# Patient Record
Sex: Female | Born: 1940 | Race: White | Hispanic: No | Smoking: Former smoker
Health system: Southern US, Community
[De-identification: ages and names within clinical notes are randomized; demographics above are authoritative.]

## PROBLEM LIST (undated history)

## (undated) DIAGNOSIS — R011 Cardiac murmur, unspecified: Secondary | ICD-10-CM

## (undated) DIAGNOSIS — I480 Paroxysmal atrial fibrillation: Secondary | ICD-10-CM

## (undated) DIAGNOSIS — I2510 Atherosclerotic heart disease of native coronary artery without angina pectoris: Secondary | ICD-10-CM

## (undated) DIAGNOSIS — E119 Type 2 diabetes mellitus without complications: Secondary | ICD-10-CM

## (undated) DIAGNOSIS — I10 Essential (primary) hypertension: Secondary | ICD-10-CM

## (undated) DIAGNOSIS — B019 Varicella without complication: Secondary | ICD-10-CM

## (undated) DIAGNOSIS — E785 Hyperlipidemia, unspecified: Secondary | ICD-10-CM

## (undated) DIAGNOSIS — I5189 Other ill-defined heart diseases: Secondary | ICD-10-CM

## (undated) HISTORY — DX: Hyperlipidemia, unspecified: E78.5

## (undated) HISTORY — DX: Other ill-defined heart diseases: I51.89

## (undated) HISTORY — DX: Cardiac murmur, unspecified: R01.1

## (undated) HISTORY — PX: CARDIAC CATHETERIZATION: SHX172

## (undated) HISTORY — DX: Varicella without complication: B01.9

## (undated) HISTORY — DX: Atherosclerotic heart disease of native coronary artery without angina pectoris: I25.10

## (undated) HISTORY — DX: Essential (primary) hypertension: I10

---
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
## 2002-05-30 HISTORY — PX: OTHER SURGICAL HISTORY: SHX169

## 2010-05-30 LAB — HM MAMMOGRAPHY: HM Mammogram: NORMAL

## 2010-12-12 LAB — HM MAMMOGRAPHY: HM Mammogram: NORMAL

## 2011-07-29 LAB — HM DIABETES EYE EXAM

## 2011-12-12 ENCOUNTER — Encounter: Payer: Self-pay | Admitting: Internal Medicine

## 2011-12-12 ENCOUNTER — Ambulatory Visit (INDEPENDENT_AMBULATORY_CARE_PROVIDER_SITE_OTHER): Payer: Medicare PPO | Admitting: Internal Medicine

## 2011-12-12 VITALS — BP 112/82 | HR 73 | Temp 98.7°F | Ht 64.0 in | Wt 230.5 lb

## 2011-12-12 DIAGNOSIS — E119 Type 2 diabetes mellitus without complications: Secondary | ICD-10-CM

## 2011-12-12 DIAGNOSIS — I10 Essential (primary) hypertension: Secondary | ICD-10-CM

## 2011-12-12 DIAGNOSIS — E785 Hyperlipidemia, unspecified: Secondary | ICD-10-CM | POA: Insufficient documentation

## 2011-12-12 DIAGNOSIS — E669 Obesity, unspecified: Secondary | ICD-10-CM

## 2011-12-12 DIAGNOSIS — H409 Unspecified glaucoma: Secondary | ICD-10-CM | POA: Insufficient documentation

## 2011-12-12 MED ORDER — GLUCOSE BLOOD VI STRP: ORAL_STRIP | Status: DC

## 2011-12-12 MED ORDER — DESONIDE 0.05 % EX CREA: 1.0000 "application " | TOPICAL_CREAM | CUTANEOUS | Status: DC | PRN

## 2011-12-12 MED ORDER — ATORVASTATIN CALCIUM 20 MG PO TABS: 20.0000 mg | ORAL_TABLET | Freq: Every day | ORAL | Status: DC

## 2011-12-12 MED ORDER — TRAVOPROST 0.004 % OP SOLN: 1.0000 [drp] | Freq: Every day | OPHTHALMIC | Status: DC

## 2011-12-12 MED ORDER — METFORMIN HCL ER 500 MG PO TB24: 500.0000 mg | ORAL_TABLET | Freq: Two times a day (BID) | ORAL | Status: DC

## 2011-12-12 MED ORDER — METOPROLOL SUCCINATE ER 50 MG PO TB24: 50.0000 mg | ORAL_TABLET | Freq: Every day | ORAL | Status: DC

## 2011-12-12 MED ORDER — HYDROCHLOROTHIAZIDE 25 MG PO TABS: 25.0000 mg | ORAL_TABLET | Freq: Every day | ORAL | Status: DC

## 2011-12-12 NOTE — Assessment & Plan Note (Signed)
Patient reports good control of blood sugars. Will plan to recheck A1c with labs in October. Will request records on previous evaluation and management.

## 2011-12-12 NOTE — Assessment & Plan Note (Signed)
Will request records on previous evaluation and management. 

## 2011-12-12 NOTE — Assessment & Plan Note (Signed)
Will request labs on previous evaluation and management. Will continue Lipitor. Will check lipids with labs in October 2013.

## 2011-12-12 NOTE — Assessment & Plan Note (Signed)
BMI 39. Encourage continued efforts at healthy diet and regular physical activity.

## 2011-12-12 NOTE — Progress Notes (Signed)
Subjective:    Patient ID: Gabriela Reed, female    DOB: [birth year], 71 y.o.   MRN: [id number]  HPI 71 year old female with history of diabetes, hypertension, hyperlipidemia, glaucoma presents to establish care. She reports she is generally feeling well. She recently moved to [facility] from [place]. She moved in order to be closer to her daughter. She reports that her diabetes has been well-controlled. She reports full compliance with her medications. She is unsure of her last hemoglobin A1c. She notes that her previous physician was checking her A1c every 6 months. She reports that she has had some difficulty with diet, but follows a healthy exercise routine participating in water aerobics classes and other classes at her local [employer]. She also reports full compliance with her other medications. She has not yet established care with an ophthalmologist locally.  Outpatient Encounter Prescriptions as of 12/12/2011  Medication Sig Dispense Refill  . aspirin 81 MG tablet Take 81 mg by mouth daily.      Marland Kitchen atorvastatin (LIPITOR) 20 MG tablet Take 1 tablet (20 mg total) by mouth daily.  90 tablet  3  . desonide (DESOWEN) 0.05 % cream Apply 1 application topically as needed.  60 g  3  . glucose blood test strip One touch ultra test strips-test blood sugar every other day  100 each  3  . hydrochlorothiazide (HYDRODIURIL) 25 MG tablet Take 1 tablet (25 mg total) by mouth daily.  90 tablet  3  . metFORMIN (GLUCOPHAGE-XR) 500 MG 24 hr tablet Take 1 tablet (500 mg total) by mouth 2 (two) times daily.  180 tablet  3  . metoprolol succinate (TOPROL-XL) 50 MG 24 hr tablet Take 1 tablet (50 mg total) by mouth daily. Take with or immediately following a meal.  90 tablet  3  . travoprost, benzalkonium, (TRAVATAN) 0.004 % ophthalmic solution Place 1 drop into the left eye at bedtime.  5 mL  3   BP 112/82  Pulse 73  Temp 98.7 F (37.1 C) (Oral)  Ht 5\' 4"  (1.626 m)  Wt 230 lb 8 oz (104.554 kg)   BMI 39.57 kg/m2  SpO2 97% Review of Systems  Constitutional: Negative for fever, chills, appetite change, fatigue and unexpected weight change.  HENT: Negative for ear pain, congestion, sore throat, trouble swallowing, neck pain, voice change and sinus pressure.   Eyes: Negative for visual disturbance.  Respiratory: Negative for cough, shortness of breath, wheezing and stridor.   Cardiovascular: Negative for chest pain, palpitations and leg swelling.  Gastrointestinal: Negative for nausea, vomiting, abdominal pain, diarrhea, constipation, blood in stool, abdominal distention and anal bleeding.  Genitourinary: Negative for dysuria and flank pain.  Musculoskeletal: Negative for myalgias, arthralgias and gait problem.  Skin: Negative for color change and rash.  Neurological: Negative for dizziness and headaches.  Hematological: Negative for adenopathy. Does not bruise/bleed easily.  Psychiatric/Behavioral: Negative for suicidal ideas, disturbed wake/sleep cycle and dysphoric mood. The patient is not nervous/anxious.        Objective:   Physical Exam  Constitutional: She is oriented to person, place, and time. She appears well-developed and well-nourished. No distress.  HENT:  Head: Normocephalic and atraumatic.  Right Ear: External ear normal.  Left Ear: External ear normal.  Nose: Nose normal.  Mouth/Throat: Oropharynx is clear and moist. No oropharyngeal exudate.  Eyes: Conjunctivae are normal. Pupils are equal, round, and reactive to light. Right eye exhibits no discharge. Left eye exhibits no discharge. No scleral icterus.  Neck: Normal range  of motion. Neck supple. No tracheal deviation present. No thyromegaly present.  Cardiovascular: Normal rate, regular rhythm, normal heart sounds and intact distal pulses.  Exam reveals no gallop and no friction rub.   No murmur heard. Pulmonary/Chest: Effort normal and breath sounds normal. No respiratory distress. She has no wheezes. She has no  rales. She exhibits no tenderness.  Abdominal: Soft. Bowel sounds are normal. She exhibits no distension and no mass. There is no tenderness. There is no guarding.  Musculoskeletal: Normal range of motion. She exhibits no edema and no tenderness.  Lymphadenopathy:    She has no cervical adenopathy.  Neurological: She is alert and oriented to person, place, and time. No cranial nerve deficit. She exhibits normal muscle tone. Coordination normal.  Skin: Skin is warm and dry. No rash noted. She is not diaphoretic. No erythema. No pallor.  Psychiatric: She has a normal mood and affect. Her behavior is normal. Judgment and thought content normal.          Assessment & Plan:

## 2011-12-12 NOTE — Assessment & Plan Note (Signed)
Blood pressure well-controlled today. Will request records on previous dilation and management. Will check renal function with labs in October 2013.

## 2011-12-13 ENCOUNTER — Encounter: Payer: Self-pay | Admitting: Internal Medicine

## 2012-03-07 ENCOUNTER — Other Ambulatory Visit (INDEPENDENT_AMBULATORY_CARE_PROVIDER_SITE_OTHER): Payer: Medicare PPO

## 2012-03-07 DIAGNOSIS — E119 Type 2 diabetes mellitus without complications: Secondary | ICD-10-CM

## 2012-03-07 DIAGNOSIS — E785 Hyperlipidemia, unspecified: Secondary | ICD-10-CM

## 2012-03-07 LAB — LIPID PANEL
HDL: 43.5 mg/dL (ref >39.00)
LDL Cholesterol: 64 mg/dL (ref 0–99)
Total CHOL/HDL Ratio: 3
VLDL: 24.2 mg/dL (ref 0.0–40.0)

## 2012-03-07 LAB — COMPREHENSIVE METABOLIC PANEL
ALT: 30 U/L (ref 0–35)
AST: 27 U/L (ref 0–37)
Albumin: 4.1 g/dL (ref 3.5–5.2)
Alkaline Phosphatase: 59 U/L (ref 39–117)
Glucose, Bld: 128 mg/dL — ABNORMAL HIGH (ref 70–99)
Potassium: 4.3 mEq/L (ref 3.5–5.1)
Sodium: 142 mEq/L (ref 135–145)
Total Bilirubin: 0.8 mg/dL (ref 0.3–1.2)
Total Protein: 7.4 g/dL (ref 6.0–8.3)

## 2012-03-08 ENCOUNTER — Other Ambulatory Visit: Payer: Medicare PPO

## 2012-03-13 ENCOUNTER — Ambulatory Visit (INDEPENDENT_AMBULATORY_CARE_PROVIDER_SITE_OTHER): Payer: Medicare PPO | Admitting: Internal Medicine

## 2012-03-13 ENCOUNTER — Encounter: Payer: Self-pay | Admitting: Internal Medicine

## 2012-03-13 VITALS — BP 140/90 | HR 83 | Temp 98.7°F | Ht 64.0 in | Wt 234.5 lb

## 2012-03-13 DIAGNOSIS — E785 Hyperlipidemia, unspecified: Secondary | ICD-10-CM

## 2012-03-13 DIAGNOSIS — E119 Type 2 diabetes mellitus without complications: Secondary | ICD-10-CM

## 2012-03-13 DIAGNOSIS — I10 Essential (primary) hypertension: Secondary | ICD-10-CM

## 2012-03-13 DIAGNOSIS — E669 Obesity, unspecified: Secondary | ICD-10-CM

## 2012-03-13 DIAGNOSIS — I2510 Atherosclerotic heart disease of native coronary artery without angina pectoris: Secondary | ICD-10-CM | POA: Insufficient documentation

## 2012-03-13 DIAGNOSIS — R42 Dizziness and giddiness: Secondary | ICD-10-CM

## 2012-03-13 LAB — HM MAMMOGRAPHY

## 2012-03-13 LAB — HM COLONOSCOPY

## 2012-03-13 NOTE — Assessment & Plan Note (Signed)
BP slightly elevated today, but has been well controlled at home. Will continue current medications. Recent renal function normal. Follow up 6 months and prn.

## 2012-03-13 NOTE — Assessment & Plan Note (Signed)
BG well controlled with A1c of 6.6%. Will continue metformin. Follow up in 6 months per pt preference.

## 2012-03-13 NOTE — Assessment & Plan Note (Signed)
Encouraged pt to continue efforts at healthy diet and regular physical activity. Follow up 6 months and prn.

## 2012-03-13 NOTE — Progress Notes (Signed)
Subjective:    Patient ID: Gabriela Reed, female    DOB: 1941/04/21, 71 y.o.   MRN: [id number]  HPI 71 year old female with history of diabetes, obesity, hyperlipidemia, hypertension presents for followup. She reports she is generally doing well. She continues to follow relatively healthy diet and has been trying to limit intake of processed carbohydrates. She has also been exercising on a regular basis with water aerobics and walking. She reports full compliance of medications. She reports that fasting blood sugars have been near 100. She did have one episode when she was driving and for a couple of seconds she became lightheaded. This resolved without any intervention. She did not have any chest pain, shortness of breath, diaphoresis during that time. She was not able to check her blood sugar during that time. She has not had any recurrent episodes. She has no new concerns today.  Outpatient Encounter Prescriptions as of 03/13/2012  Medication Sig Dispense Refill  . aspirin 81 MG tablet Take 81 mg by mouth daily.      Marland Kitchen atorvastatin (LIPITOR) 20 MG tablet Take 1 tablet (20 mg total) by mouth daily.  90 tablet  3  . desonide (DESOWEN) 0.05 % cream Apply 1 application topically as needed.  60 g  3  . glucose blood test strip One touch ultra test strips-test blood sugar every other day  100 each  3  . hydrochlorothiazide (HYDRODIURIL) 25 MG tablet Take 1 tablet (25 mg total) by mouth daily.  90 tablet  3  . metFORMIN (GLUCOPHAGE-XR) 500 MG 24 hr tablet Take 1 tablet (500 mg total) by mouth 2 (two) times daily.  180 tablet  3  . metoprolol succinate (TOPROL-XL) 50 MG 24 hr tablet Take 1 tablet (50 mg total) by mouth daily. Take with or immediately following a meal.  90 tablet  3  . travoprost, benzalkonium, (TRAVATAN) 0.004 % ophthalmic solution Place 1 drop into the left eye at bedtime.  5 mL  3   BP 140/90  Pulse 83  Temp 98.7 F (37.1 C) (Oral)  Ht 5\' 4"  (1.626 m)  Wt 234 lb 8 oz (106.369  kg)  BMI 40.25 kg/m2  SpO2 98%  Review of Systems  Constitutional: Negative for fever, chills, appetite change, fatigue and unexpected weight change.  HENT: Negative for ear pain, congestion, sore throat, trouble swallowing, neck pain, voice change and sinus pressure.   Eyes: Negative for visual disturbance.  Respiratory: Negative for cough, shortness of breath, wheezing and stridor.   Cardiovascular: Negative for chest pain, palpitations and leg swelling.  Gastrointestinal: Negative for nausea, vomiting, abdominal pain, diarrhea, constipation, blood in stool, abdominal distention and anal bleeding.  Genitourinary: Negative for dysuria and flank pain.  Musculoskeletal: Negative for myalgias, arthralgias and gait problem.  Skin: Negative for color change and rash.  Neurological: Positive for light-headedness. Negative for dizziness and headaches.  Hematological: Negative for adenopathy. Does not bruise/bleed easily.  Psychiatric/Behavioral: Negative for suicidal ideas, disturbed wake/sleep cycle and dysphoric mood. The patient is not nervous/anxious.        Objective:   Physical Exam  Constitutional: She is oriented to person, place, and time. She appears well-developed and well-nourished. No distress.  HENT:  Head: Normocephalic and atraumatic.  Right Ear: External ear normal.  Left Ear: External ear normal.  Nose: Nose normal.  Mouth/Throat: Oropharynx is clear and moist. No oropharyngeal exudate.  Eyes: Conjunctivae normal are normal. Pupils are equal, round, and reactive to light. Right eye exhibits no discharge. Left  eye exhibits no discharge. No scleral icterus.  Neck: Normal range of motion. Neck supple. No tracheal deviation present. No thyromegaly present.  Cardiovascular: Normal rate, regular rhythm, normal heart sounds and intact distal pulses.  Exam reveals no gallop and no friction rub.   No murmur heard. Pulmonary/Chest: Effort normal and breath sounds normal. No  respiratory distress. She has no wheezes. She has no rales. She exhibits no tenderness.  Musculoskeletal: Normal range of motion. She exhibits no edema and no tenderness.  Lymphadenopathy:    She has no cervical adenopathy.  Neurological: She is alert and oriented to person, place, and time. No cranial nerve deficit. She exhibits normal muscle tone. Coordination normal.  Skin: Skin is warm and dry. No rash noted. She is not diaphoretic. No erythema. No pallor.  Psychiatric: She has a normal mood and affect. Her behavior is normal. Judgment and thought content normal.          Assessment & Plan:

## 2012-03-13 NOTE — Assessment & Plan Note (Signed)
Currently asymptomatic. LDL at goal <70. Will continue current medications. Follow up in 6 months and prn.

## 2012-03-13 NOTE — Assessment & Plan Note (Signed)
Recent Lipids well controlled with LDL 64 and LFTs normal. Will check lipids prior to next visit in 6 months.

## 2012-03-13 NOTE — Assessment & Plan Note (Addendum)
Patient had single episode of lightheadedness which lasted for a couple of seconds with no other associated symptoms. Unclear whether this may be have been related to dehydration, hypoglycemia, or other cause. Will monitor for now. Patient will call if any recurrent symptoms.

## 2012-07-11 ENCOUNTER — Encounter: Payer: Self-pay | Admitting: Internal Medicine

## 2012-07-11 ENCOUNTER — Ambulatory Visit (INDEPENDENT_AMBULATORY_CARE_PROVIDER_SITE_OTHER): Payer: Medicare PPO | Admitting: Internal Medicine

## 2012-07-11 ENCOUNTER — Ambulatory Visit: Payer: Self-pay | Admitting: Internal Medicine

## 2012-07-11 VITALS — BP 142/78 | HR 86 | Temp 98.2°F | Ht 64.0 in | Wt 237.8 lb

## 2012-07-11 DIAGNOSIS — E119 Type 2 diabetes mellitus without complications: Secondary | ICD-10-CM

## 2012-07-11 DIAGNOSIS — H409 Unspecified glaucoma: Secondary | ICD-10-CM

## 2012-07-11 DIAGNOSIS — R002 Palpitations: Secondary | ICD-10-CM

## 2012-07-11 DIAGNOSIS — I10 Essential (primary) hypertension: Secondary | ICD-10-CM

## 2012-07-11 DIAGNOSIS — E785 Hyperlipidemia, unspecified: Secondary | ICD-10-CM

## 2012-07-11 DIAGNOSIS — R200 Anesthesia of skin: Secondary | ICD-10-CM

## 2012-07-11 DIAGNOSIS — R51 Headache: Secondary | ICD-10-CM

## 2012-07-11 DIAGNOSIS — R519 Headache, unspecified: Secondary | ICD-10-CM | POA: Insufficient documentation

## 2012-07-11 DIAGNOSIS — R739 Hyperglycemia, unspecified: Secondary | ICD-10-CM

## 2012-07-11 DIAGNOSIS — R7309 Other abnormal glucose: Secondary | ICD-10-CM

## 2012-07-11 DIAGNOSIS — R209 Unspecified disturbances of skin sensation: Secondary | ICD-10-CM

## 2012-07-11 MED ORDER — DESONIDE 0.05 % EX CREA: 1.0000 "application " | TOPICAL_CREAM | CUTANEOUS | Status: DC | PRN

## 2012-07-11 MED ORDER — TRAVOPROST 0.004 % OP SOLN: 1.0000 [drp] | Freq: Every day | OPHTHALMIC | Status: DC

## 2012-07-11 NOTE — Assessment & Plan Note (Signed)
Pt overdue for A1c. Will check with labs.

## 2012-07-11 NOTE — Progress Notes (Signed)
Addendum:  MRI brain - Unremarkable. No acute process.  US Carotid - Minimal atherosclerotic disease. No significant stenosis.  Attempted to call pt with results at 6:46pm, but no answer and not accepting voice mail.

## 2012-07-11 NOTE — Progress Notes (Signed)
Subjective:    Patient ID: Gabriela Reed, female    DOB: 01/24/1941, 72 y.o.   MRN: [id number]  HPI 72YO female with DM, HTN, CAD s/p stent placement presents for acute visit. Complains of 2 weeks of mild daily headache, described as aching across forehead and back of neck. Has taken Tylenol on occasion for this, with minimal improvement. Generally, symptoms manageable without medication. No associated nausea, visual changes generally except this past Wednesday developed blurred vision, lost vision left side for several seconds. Vision then returned without intervention.  Also describes episode last August - when she felt "like underwater" when driving, then left arm felt "strange" not really numb or weak. This has not recurred.  Over last several months, also have episodes of occasional palpitations which occur at rest, no chest pain, some flushing with diaphoresis. Episodes last a few seconds then resolve without intervention.  Reports compliance with medications.  Outpatient Encounter Prescriptions as of 07/11/2012  Medication Sig Dispense Refill  . aspirin 81 MG tablet Take 81 mg by mouth daily.      Marland Kitchen atorvastatin (LIPITOR) 20 MG tablet Take 1 tablet (20 mg total) by mouth daily.  90 tablet  3  . desonide (DESOWEN) 0.05 % cream Apply 1 application topically as needed.  60 g  3  . glucose blood test strip One touch ultra test strips-test blood sugar every other day  100 each  3  . hydrochlorothiazide (HYDRODIURIL) 25 MG tablet Take 1 tablet (25 mg total) by mouth daily.  90 tablet  3  . metFORMIN (GLUCOPHAGE-XR) 500 MG 24 hr tablet Take 1 tablet (500 mg total) by mouth 2 (two) times daily.  180 tablet  3  . metoprolol succinate (TOPROL-XL) 50 MG 24 hr tablet Take 1 tablet (50 mg total) by mouth daily. Take with or immediately following a meal.  90 tablet  3  . Travoprost, BAK Free, (TRAVATAN) 0.004 % SOLN ophthalmic solution Place 1 drop into the left eye at bedtime.       No  facility-administered encounter medications on file as of 07/11/2012.   BP 142/78  Pulse 86  Temp(Src) 98.2 F (36.8 C) (Oral)  Ht 5\' 4"  (1.626 m)  Wt 237 lb 12 oz (107.843 kg)  BMI 40.79 kg/m2  SpO2 97%   Review of Systems  Constitutional: Negative for fever, chills, appetite change, fatigue and unexpected weight change.  HENT: Negative for ear pain, congestion, sore throat, trouble swallowing, neck pain, voice change and sinus pressure.   Eyes: Positive for visual disturbance.  Respiratory: Negative for cough, shortness of breath, wheezing and stridor.   Cardiovascular: Negative for chest pain, palpitations and leg swelling.  Gastrointestinal: Negative for nausea, vomiting, abdominal pain, diarrhea, constipation, blood in stool, abdominal distention and anal bleeding.  Genitourinary: Negative for dysuria and flank pain.  Musculoskeletal: Negative for myalgias, arthralgias and gait problem.  Skin: Negative for color change and rash.  Neurological: Positive for weakness and light-headedness. Negative for dizziness, speech difficulty and headaches.  Hematological: Negative for adenopathy. Does not bruise/bleed easily.  Psychiatric/Behavioral: Negative for suicidal ideas, sleep disturbance and dysphoric mood. The patient is not nervous/anxious.        Objective:   Physical Exam  Constitutional: She is oriented to person, place, and time. She appears well-developed and well-nourished. No distress.  HENT:  Head: Normocephalic and atraumatic.  Right Ear: External ear normal.  Left Ear: External ear normal.  Nose: Nose normal.  Mouth/Throat: Oropharynx is clear and moist. No  oropharyngeal exudate.  Eyes: Conjunctivae are normal. Pupils are equal, round, and reactive to light. Right eye exhibits no discharge. Left eye exhibits no discharge. No scleral icterus.  Neck: Normal range of motion. Neck supple. No tracheal deviation present. No thyromegaly present.  Cardiovascular: Normal  rate, regular rhythm, normal heart sounds and intact distal pulses.  Exam reveals no gallop and no friction rub.   No murmur heard. Pulmonary/Chest: Effort normal and breath sounds normal. No accessory muscle usage. Not tachypneic. No respiratory distress. She has no decreased breath sounds. She has no wheezes. She has no rhonchi. She has no rales. She exhibits no tenderness.  Musculoskeletal: Normal range of motion. She exhibits no edema and no tenderness.  Lymphadenopathy:    She has no cervical adenopathy.  Neurological: She is alert and oriented to person, place, and time. No cranial nerve deficit or sensory deficit. She exhibits normal muscle tone. Coordination and gait normal.  Skin: Skin is warm and dry. No rash noted. She is not diaphoretic. No erythema. No pallor.  Psychiatric: She has a normal mood and affect. Her behavior is normal. Judgment and thought content normal.          Assessment & Plan:

## 2012-07-11 NOTE — Assessment & Plan Note (Signed)
Intermittent left arm numbness concerning for TIA/stroke. Will set up MRI brain and carotid dopplers. Continue Aspirin, statin. Follow up 1 week.

## 2012-07-11 NOTE — Assessment & Plan Note (Signed)
Intermittent headache, left arm numbness, concerning for TIA/stroke. Will set up MRI brain and carotid dopplers today.

## 2012-07-11 NOTE — Patient Instructions (Addendum)
Transient Ischemic Attack  A transient ischemic attack (TIA) is a "warning stroke" that causes stroke-like symptoms. Unlike a stroke, a TIA does not cause permanent damage to the brain. The symptoms of a TIA can happen very fast and do not last long. It is important to know the symptoms of a TIA and what to do. This can help prevent a major stroke or death.  CAUSES    A TIA is caused by a temporary blockage in an artery in the brain or neck (carotid artery). The blockage does not allow the brain to get the blood supply it needs and can cause different symptoms. The blockage can be caused by either:   A blood clot.   Fatty buildup (plaque) in a neck or brain artery.  SYMPTOMS   TIA symptoms are the same as a stroke but are temporary. Symptoms can include sudden:   Numbness or weakness on one side of the body. Especially to the:   Face.   Arm.   Leg.   Trouble speaking, thinking, or confusion.   Change in vision, such as trouble seeing in one or both eyes.   Dizziness, loss of balance, or difficulty walking.   Severe headache.  ANY OF THESE SYMPTOMS MAY REPRESENT A SERIOUS PROBLEM THAT IS AN EMERGENCY. Do not wait to see if the symptoms will go away. Get medical help at once. Call your local emergency services (911 in [place] IMMEDIATELY. DO NOT drive yourself to the hospital.  RISK FACTORS  Risk factors can increase the risk of developing a TIA. These can include.    High blood pressure (hypertension).   High cholesterol (hyperlipidemia).   Heart disease (atherosclerosis).   Smoking.   Diabetes.   Abnormal heart rhythm (atrial fibrillation).   Family history of a stroke or heart attack.   Use of oral contraceptives (especially when combined with smoking).  DIAGNOSIS    A TIA can be diagnosed based on your:   Symptoms.   History.   Risk factors.   Tests that can help diagnose the symptoms of a TIA include:   CT or MRI scan. These tests can provide detailed images of the brain.   Carotid  ultrasound. This test looks to see if there are blockages in the carotid arteries of your neck.   Arteriography. A thin, small flexible tube (catheter) is inserted through a small cut (incision) in your groin. The catheter is threaded to your carotid or vertebral artery. A dye is then injected into the catheter. The dye highlights the arteries in your brain and allows your caregiver to look for narrowing or blockages that can cause a TIA.  TREATMENT   Based on the cause of a TIA, treatment options can vary. Treatment is important to help prevent a stroke. Treatment options can include:   Medication. Such as:   Clot-busting medicine.   Anti-platelet medicine.   Blood pressure medicine.   Blood thinner medicine.   Surgery:   Carotid endarterectomy. The carotid arteries are the arteries that supply the head and neck with oxygenated blood. This surgery can help remove fatty deposits (plaque) in the carotid arteries.   Angioplasty and stenting. This surgery uses a balloon to dilate a blocked artery in the brain. A stent is a small, metal mesh tube that can help keep an artery open  HOME CARE INSTRUCTIONS    It is important to take all medicine as told by your caregiver. If the medicine has side effects that affect you negatively,   tell your caregiver right away. Do not stop taking medicine unless told by your caregiver. Some medicines may need to be changed to better treat your condition.   Do not smoke. Talk to your caregiver on how to quit smoking.   Eat a diet high in fruits, vegetables and lean meat. Avoid a high fat, high salt diet. A dietician can you help you make healthy food choices.   Maintain a healthy weight. Develop an exercise plan approved by your caregiver.  SEEK IMMEDIATE MEDICAL CARE IF:    You develop weakness or numbness on one side of your body.   You have problems thinking, speaking, or feel confused.   You have vision changes.   You feel dizzy, have trouble walking, or lose your  balance.   You develop a severe headache.  MAKE SURE YOU:    Understand these instructions.   Will watch your condition.   Will get help right away if you are not doing well or get worse.  Document Released: 02/23/2005 Document Revised: 08/08/2011 Document Reviewed: 07/09/2009  [employer] Patient Information 2013 [employer].

## 2012-07-11 NOTE — Assessment & Plan Note (Signed)
Intermittent palpitations. Exam normal today. EKG shows NSR. No chest pain, diaphoresis to suggest cardiac ischemia. Question if she may be having runs of arrythmia. Will consider cardiology evaluation with holter monitor. Will plan to check CMP, CBC, TSH with labs.

## 2012-07-11 NOTE — Assessment & Plan Note (Signed)
  BP Readings from Last 3 Encounters:  07/11/12 142/78  03/13/12 140/90  12/12/11 112/82   BP has generally been well controlled on medications. BP elevated, however pt did not take medication this morning. Will continue current medications. Follow up 1 week.

## 2012-07-11 NOTE — Progress Notes (Signed)
[name] at [facility] notified by telephone.

## 2012-07-11 NOTE — Progress Notes (Signed)
[name] from [facility] called stating that they do not make Travatan any longer and wants to know if it can be switched to Travatan Z. Called and advised [name] that she would need to notify her eye doctor for this change. Spoke to patient and was advised that she has been on Travatan Z and not plain Travatan. Is it okay to change. Patient states that she does not have a eye doctor now and is looking for one.  Agree - fine to fill.

## 2012-07-14 ENCOUNTER — Other Ambulatory Visit: Payer: Self-pay

## 2012-07-16 ENCOUNTER — Telehealth: Payer: Self-pay | Admitting: Internal Medicine

## 2012-07-16 NOTE — Telephone Encounter (Signed)
FYI...   To Dr. Dan Humphreys

## 2012-07-16 NOTE — Telephone Encounter (Signed)
Tried to call pt but no answer and not able to leave voicemail.  Sent this MyChart message today.  Can you please confirm pt received this message?   Both MRI Brain and carotid dopplers were normal. Given the persistence of the headaches, I think the next best step would be referral to neurology for further evaluation. Please let me know if you would like to proceed with this.

## 2012-07-16 NOTE — Telephone Encounter (Signed)
Left message for pt to call office.  Please schedule labs for friday

## 2012-07-16 NOTE — Telephone Encounter (Signed)
Pt received msg regarding lab results being normal.  Pt states message told her if she wanted to make a f/u appt she could.  Pt states she is feeling well and does not need a f/u appt.  Pt is sched in April and will keep that appt for f/u.

## 2012-07-18 NOTE — Telephone Encounter (Signed)
Noted  

## 2012-07-18 NOTE — Telephone Encounter (Signed)
Pt returned call.  Pt states she has an appt in April and is feeling fine so does not want to come in sooner for labs.  Please advise.

## 2012-07-18 NOTE — Telephone Encounter (Signed)
Yes, she needs to have labs performed. They are already in the system.

## 2012-07-18 NOTE — Telephone Encounter (Signed)
LMTCB

## 2012-07-18 NOTE — Telephone Encounter (Signed)
Dr Dan Humphreys Do you still want Gabriela Reed come back for lab work

## 2012-08-01 ENCOUNTER — Encounter: Payer: Self-pay | Admitting: Internal Medicine

## 2012-09-10 ENCOUNTER — Other Ambulatory Visit (INDEPENDENT_AMBULATORY_CARE_PROVIDER_SITE_OTHER): Payer: Medicare PPO

## 2012-09-10 DIAGNOSIS — E785 Hyperlipidemia, unspecified: Secondary | ICD-10-CM

## 2012-09-10 DIAGNOSIS — R002 Palpitations: Secondary | ICD-10-CM

## 2012-09-10 DIAGNOSIS — E119 Type 2 diabetes mellitus without complications: Secondary | ICD-10-CM

## 2012-09-10 DIAGNOSIS — I10 Essential (primary) hypertension: Secondary | ICD-10-CM

## 2012-09-10 LAB — COMPREHENSIVE METABOLIC PANEL
ALT: 33 U/L (ref 0–35)
AST: 30 U/L (ref 0–37)
CO2: 27 mEq/L (ref 19–32)
Calcium: 9.3 mg/dL (ref 8.4–10.5)
Chloride: 105 mEq/L (ref 96–112)
Creatinine, Ser: 0.6 mg/dL (ref 0.4–1.2)
GFR: 97.59 mL/min (ref >60.00)
Potassium: 4.3 mEq/L (ref 3.5–5.1)
Sodium: 138 mEq/L (ref 135–145)
Total Protein: 7.6 g/dL (ref 6.0–8.3)

## 2012-09-10 LAB — CBC WITH DIFFERENTIAL/PLATELET
Basophils Absolute: 0 10*3/uL (ref 0.0–0.1)
Eosinophils Absolute: 0.1 10*3/uL (ref 0.0–0.7)
Hemoglobin: 13.3 g/dL (ref 12.0–15.0)
Lymphocytes Relative: 44.9 % (ref 12.0–46.0)
MCHC: 33.6 g/dL (ref 30.0–36.0)
Neutro Abs: 1.9 10*3/uL (ref 1.4–7.7)
Neutrophils Relative %: 44 % (ref 43.0–77.0)
RDW: 13 % (ref 11.5–14.6)

## 2012-09-10 LAB — LIPID PANEL
HDL: 39.7 mg/dL (ref >39.00)
Total CHOL/HDL Ratio: 4

## 2012-09-13 ENCOUNTER — Ambulatory Visit (INDEPENDENT_AMBULATORY_CARE_PROVIDER_SITE_OTHER): Payer: Medicare PPO | Admitting: Internal Medicine

## 2012-09-13 ENCOUNTER — Encounter: Payer: Self-pay | Admitting: Internal Medicine

## 2012-09-13 VITALS — BP 136/84 | HR 82 | Temp 97.8°F | Wt 239.0 lb

## 2012-09-13 DIAGNOSIS — Z1211 Encounter for screening for malignant neoplasm of colon: Secondary | ICD-10-CM

## 2012-09-13 DIAGNOSIS — E785 Hyperlipidemia, unspecified: Secondary | ICD-10-CM

## 2012-09-13 DIAGNOSIS — E669 Obesity, unspecified: Secondary | ICD-10-CM

## 2012-09-13 DIAGNOSIS — Z1239 Encounter for other screening for malignant neoplasm of breast: Secondary | ICD-10-CM

## 2012-09-13 DIAGNOSIS — E119 Type 2 diabetes mellitus without complications: Secondary | ICD-10-CM

## 2012-09-13 DIAGNOSIS — I10 Essential (primary) hypertension: Secondary | ICD-10-CM

## 2012-09-13 MED ORDER — HYDROCHLOROTHIAZIDE 25 MG PO TABS: 25.0000 mg | ORAL_TABLET | Freq: Every day | ORAL | Status: DC

## 2012-09-13 MED ORDER — ATORVASTATIN CALCIUM 20 MG PO TABS: 20.0000 mg | ORAL_TABLET | Freq: Every day | ORAL | Status: DC

## 2012-09-13 MED ORDER — METOPROLOL SUCCINATE ER 50 MG PO TB24: 50.0000 mg | ORAL_TABLET | Freq: Every day | ORAL | Status: DC

## 2012-09-13 MED ORDER — METFORMIN HCL ER 500 MG PO TB24: 500.0000 mg | ORAL_TABLET | Freq: Two times a day (BID) | ORAL | Status: DC

## 2012-09-13 NOTE — Assessment & Plan Note (Signed)
BP Readings from Last 3 Encounters:  09/13/12 136/84  07/11/12 142/78  03/13/12 140/90   BP well controlled on current medications. Will continue.

## 2012-09-13 NOTE — Assessment & Plan Note (Signed)
Lab Results  Component Value Date   HGBA1C 6.8* 09/10/2012   BG well controlled on current medications. Will continue. Encouraged effort at weight loss with improved diet, lower in processed carbohydrates. Will plan to repeat A1c in 6 months (per pt preference).

## 2012-09-13 NOTE — Assessment & Plan Note (Signed)
Wt Readings from Last 3 Encounters:  09/13/12 239 lb (108.41 kg)  07/11/12 237 lb 12 oz (107.843 kg)  03/13/12 234 lb 8 oz (106.369 kg)   Encouraged effort at weight loss with reduced caloric intake and keeping a food diary. Encouraged setting goal of exercise 3 x per week.

## 2012-09-13 NOTE — Progress Notes (Signed)
Subjective:    Patient ID: Gabriela Reed, female    DOB: [birth year], 72 y.o.   MRN: [id number]  HPI 72YO female with DM, HTN, HL presents for follow up. Doing well. No further headaches or symptoms of left arm numbness. Evaluation with MRI and carotid dopplers were normal. BG have been well controlled on metformin. A1c was 6.8%. Compliant with medications. Trying to improve diet in effort to lose weight. Exercising with aerobic classes 4-5 times per week.  Outpatient Encounter Prescriptions as of 09/13/2012  Medication Sig Dispense Refill  . aspirin 81 MG tablet Take 81 mg by mouth daily.      Marland Kitchen atorvastatin (LIPITOR) 20 MG tablet Take 1 tablet (20 mg total) by mouth daily.  90 tablet  3  . desonide (DESOWEN) 0.05 % cream Apply 1 application topically as needed.  60 g  3  . glucose blood test strip One touch ultra test strips-test blood sugar every other day  100 each  3  . hydrochlorothiazide (HYDRODIURIL) 25 MG tablet Take 1 tablet (25 mg total) by mouth daily.  90 tablet  3  . metFORMIN (GLUCOPHAGE-XR) 500 MG 24 hr tablet Take 1 tablet (500 mg total) by mouth 2 (two) times daily.  180 tablet  3  . metoprolol succinate (TOPROL-XL) 50 MG 24 hr tablet Take 1 tablet (50 mg total) by mouth daily. Take with or immediately following a meal.  90 tablet  3  . Travoprost, BAK Free, (TRAVATAN) 0.004 % SOLN ophthalmic solution Place 1 drop into the left eye at bedtime.       No facility-administered encounter medications on file as of 09/13/2012.   BP 136/84  Pulse 82  Temp(Src) 97.8 F (36.6 C) (Oral)  Wt 239 lb (108.41 kg)  BMI 41 kg/m2  SpO2 98%  Review of Systems  Constitutional: Negative for fever, chills, appetite change, fatigue and unexpected weight change.  HENT: Negative for ear pain, congestion, sore throat, trouble swallowing, neck pain, voice change and sinus pressure.   Eyes: Negative for visual disturbance.  Respiratory: Negative for cough, shortness of breath, wheezing and  stridor.   Cardiovascular: Negative for chest pain, palpitations and leg swelling.  Gastrointestinal: Negative for nausea, vomiting, abdominal pain, diarrhea, constipation, blood in stool, abdominal distention and anal bleeding.  Genitourinary: Negative for dysuria and flank pain.  Musculoskeletal: Negative for myalgias, arthralgias and gait problem.  Skin: Negative for color change and rash.  Neurological: Negative for dizziness and headaches.  Hematological: Negative for adenopathy. Does not bruise/bleed easily.  Psychiatric/Behavioral: Negative for suicidal ideas, sleep disturbance and dysphoric mood. The patient is not nervous/anxious.        Objective:   Physical Exam  Constitutional: She is oriented to person, place, and time. She appears well-developed and well-nourished. No distress.  HENT:  Head: Normocephalic and atraumatic.  Right Ear: External ear normal.  Left Ear: External ear normal.  Nose: Nose normal.  Mouth/Throat: Oropharynx is clear and moist. No oropharyngeal exudate.  Eyes: Conjunctivae are normal. Pupils are equal, round, and reactive to light. Right eye exhibits no discharge. Left eye exhibits no discharge. No scleral icterus.  Neck: Normal range of motion. Neck supple. No tracheal deviation present. No thyromegaly present.  Cardiovascular: Normal rate, regular rhythm, normal heart sounds and intact distal pulses.  Exam reveals no gallop and no friction rub.   No murmur heard. Pulmonary/Chest: Effort normal and breath sounds normal. No respiratory distress. She has no wheezes. She has no rales. She exhibits no  tenderness.  Musculoskeletal: Normal range of motion. She exhibits no edema and no tenderness.  Lymphadenopathy:    She has no cervical adenopathy.  Neurological: She is alert and oriented to person, place, and time. No cranial nerve deficit. She exhibits normal muscle tone. Coordination normal.  Skin: Skin is warm and dry. No rash noted. She is not  diaphoretic. No erythema. No pallor.  Psychiatric: She has a normal mood and affect. Her behavior is normal. Judgment and thought content normal.          Assessment & Plan:

## 2013-02-11 ENCOUNTER — Telehealth: Payer: Self-pay | Admitting: Internal Medicine

## 2013-02-11 NOTE — Telephone Encounter (Signed)
P tis calling and wanting to do lab work before her cpe on 03/18/13 if possible. I don't see any orders in her chart ??

## 2013-02-11 NOTE — Telephone Encounter (Signed)
Fwd to Dr. Walker 

## 2013-02-11 NOTE — Telephone Encounter (Signed)
I would recommend that she wait until the day of her physical, as medicare typically wont cover prior to visit.

## 2013-02-11 NOTE — Telephone Encounter (Signed)
Patient informed and verbally agreed. She will just wait until her appointment then.

## 2013-02-12 ENCOUNTER — Telehealth: Payer: Self-pay | Admitting: Internal Medicine

## 2013-02-12 DIAGNOSIS — E119 Type 2 diabetes mellitus without complications: Secondary | ICD-10-CM

## 2013-02-12 NOTE — Telephone Encounter (Signed)
Fwd to Dr. Walker 

## 2013-02-12 NOTE — Telephone Encounter (Signed)
The patient is wanting to have her diabetic check , she is wanting labs prior to her appointment. Can you put in labs for this appointment.

## 2013-03-14 ENCOUNTER — Other Ambulatory Visit (INDEPENDENT_AMBULATORY_CARE_PROVIDER_SITE_OTHER): Payer: Medicare PPO

## 2013-03-14 DIAGNOSIS — E119 Type 2 diabetes mellitus without complications: Secondary | ICD-10-CM

## 2013-03-14 LAB — HEMOGLOBIN A1C: Hgb A1c MFr Bld: 7.2 % — ABNORMAL HIGH (ref 4.6–6.5)

## 2013-03-14 LAB — MICROALBUMIN / CREATININE URINE RATIO
Creatinine,U: 153.4 mg/dL
Microalb Creat Ratio: 9.8 mg/g (ref 0.0–30.0)

## 2013-03-14 LAB — LIPID PANEL
Cholesterol: 158 mg/dL (ref 0–200)
VLDL: 29.2 mg/dL (ref 0.0–40.0)

## 2013-03-14 LAB — COMPREHENSIVE METABOLIC PANEL
ALT: 60 U/L — ABNORMAL HIGH (ref 0–35)
Alkaline Phosphatase: 61 U/L (ref 39–117)
Glucose, Bld: 154 mg/dL — ABNORMAL HIGH (ref 70–99)
Sodium: 139 mEq/L (ref 135–145)
Total Bilirubin: 0.6 mg/dL (ref 0.3–1.2)
Total Protein: 7.6 g/dL (ref 6.0–8.3)

## 2013-03-18 ENCOUNTER — Ambulatory Visit (INDEPENDENT_AMBULATORY_CARE_PROVIDER_SITE_OTHER): Payer: Medicare PPO | Admitting: Internal Medicine

## 2013-03-18 ENCOUNTER — Encounter: Payer: Self-pay | Admitting: *Deleted

## 2013-03-18 ENCOUNTER — Encounter: Payer: Self-pay | Admitting: Internal Medicine

## 2013-03-18 ENCOUNTER — Encounter: Payer: Medicare PPO | Admitting: Internal Medicine

## 2013-03-18 VITALS — BP 150/82 | HR 80 | Temp 98.3°F | Wt 244.0 lb

## 2013-03-18 DIAGNOSIS — E785 Hyperlipidemia, unspecified: Secondary | ICD-10-CM

## 2013-03-18 DIAGNOSIS — R1011 Right upper quadrant pain: Secondary | ICD-10-CM

## 2013-03-18 DIAGNOSIS — I10 Essential (primary) hypertension: Secondary | ICD-10-CM

## 2013-03-18 DIAGNOSIS — Z23 Encounter for immunization: Secondary | ICD-10-CM | POA: Insufficient documentation

## 2013-03-18 DIAGNOSIS — E119 Type 2 diabetes mellitus without complications: Secondary | ICD-10-CM

## 2013-03-18 MED ORDER — GLUCOSE BLOOD VI STRP: ORAL_STRIP | Status: DC

## 2013-03-18 NOTE — Assessment & Plan Note (Signed)
BP Readings from Last 3 Encounters:  03/18/13 150/82  09/13/12 136/84  07/11/12 142/78   Blood pressure slightly elevated today however patient reports discomfort in her right upper abdomen which is likely contributing. In general blood pressure well-controlled on current medications. Will continue to monitor. Followup in 4 weeks.After acute issue with abdominal pain resolved, will plan to add ACEi.

## 2013-03-18 NOTE — Assessment & Plan Note (Signed)
Symptoms are most consistent with cholecystitis. Will get right upper quadrant ultrasound for further evaluation.

## 2013-03-18 NOTE — Progress Notes (Signed)
Subjective:    Patient ID: Gabriela Reed, female    DOB: September 02, 1940, 72 y.o.   MRN: [id number]  HPI 72 year old female with history of diabetes, hypertension, hyperlipidemia, obesity presents for followup. Recent lab showed increase in blood sugar readings with A1c of 7.1%. She attributes this to decreased ability to exercise because of recent issues with plantar fasciitis. She notes that blood sugars have been slightly more elevated. She is compliant with metformin.  She is concerned today about several week history of intermittent right upper quadrant abdominal pain. She reports pain and right upper quadrant distention typically after eating meals. Pain is sharp. She has occasional nausea. She denies any vomiting, diarrhea, constipation, or other change in bowel habits. She denies fever or chills. She has not taken anything for pain.  Outpatient Encounter Prescriptions as of 03/18/2013  Medication Sig Dispense Refill  . aspirin 81 MG tablet Take 81 mg by mouth daily.      Marland Kitchen atorvastatin (LIPITOR) 20 MG tablet Take 1 tablet (20 mg total) by mouth daily.  90 tablet  3  . desonide (DESOWEN) 0.05 % cream Apply 1 application topically as needed.  60 g  3  . glucose blood test strip One touch ultra test strips-test blood sugar every other day  100 each  3  . hydrochlorothiazide (HYDRODIURIL) 25 MG tablet Take 1 tablet (25 mg total) by mouth daily.  90 tablet  3  . metFORMIN (GLUCOPHAGE-XR) 500 MG 24 hr tablet Take 1 tablet (500 mg total) by mouth 2 (two) times daily.  180 tablet  3  . metoprolol succinate (TOPROL-XL) 50 MG 24 hr tablet Take 1 tablet (50 mg total) by mouth daily. Take with or immediately following a meal.  90 tablet  3  . Travoprost, BAK Free, (TRAVATAN) 0.004 % SOLN ophthalmic solution Place 1 drop into the left eye at bedtime.      . [DISCONTINUED] glucose blood test strip One touch ultra test strips-test blood sugar every other day  100 each  3   No facility-administered  encounter medications on file as of 03/18/2013.   Blood pressure 150/82, pulse 80, temperature 98.3 F (36.8 C), temperature source Oral, weight 244 lb (110.678 kg), SpO2 97.00%. Review of Systems  Constitutional: Negative for fever, chills, appetite change, fatigue and unexpected weight change.  HENT: Negative for congestion, ear pain, sinus pressure, sore throat, trouble swallowing and voice change.   Eyes: Negative for visual disturbance.  Respiratory: Negative for cough, shortness of breath, wheezing and stridor.   Cardiovascular: Negative for chest pain, palpitations and leg swelling.  Gastrointestinal: Positive for abdominal pain. Negative for nausea, vomiting, diarrhea, constipation, blood in stool, abdominal distention and anal bleeding.  Genitourinary: Negative for dysuria and flank pain.  Musculoskeletal: Positive for arthralgias (right heel). Negative for gait problem, myalgias and neck pain.  Skin: Negative for color change and rash.  Neurological: Negative for dizziness and headaches.  Hematological: Negative for adenopathy. Does not bruise/bleed easily.  Psychiatric/Behavioral: Negative for suicidal ideas, sleep disturbance and dysphoric mood. The patient is not nervous/anxious.        Objective:   Physical Exam  Constitutional: She is oriented to person, place, and time. She appears well-developed and well-nourished. No distress.  HENT:  Head: Normocephalic and atraumatic.  Right Ear: External ear normal.  Left Ear: External ear normal.  Nose: Nose normal.  Mouth/Throat: Oropharynx is clear and moist. No oropharyngeal exudate.  Eyes: Conjunctivae are normal. Pupils are equal, round, and reactive to light.  Right eye exhibits no discharge. Left eye exhibits no discharge. No scleral icterus.  Neck: Normal range of motion. Neck supple. No tracheal deviation present. No thyromegaly present.  Cardiovascular: Normal rate, regular rhythm, normal heart sounds and intact distal  pulses.  Exam reveals no gallop and no friction rub.   No murmur heard. Pulmonary/Chest: Effort normal and breath sounds normal. No accessory muscle usage. Not tachypneic. No respiratory distress. She has no decreased breath sounds. She has no wheezes. She has no rhonchi. She has no rales. She exhibits no tenderness.  Abdominal: Soft. Normal appearance and bowel sounds are normal. There is no hepatosplenomegaly. There is no tenderness. There is negative Murphy's sign.  Musculoskeletal: Normal range of motion. She exhibits no edema and no tenderness.       Right foot: She exhibits normal range of motion and no bony tenderness.  Lymphadenopathy:    She has no cervical adenopathy.  Neurological: She is alert and oriented to person, place, and time. No cranial nerve deficit. She exhibits normal muscle tone. Coordination normal.  Skin: Skin is warm and dry. No rash noted. She is not diaphoretic. No erythema. No pallor.  Psychiatric: She has a normal mood and affect. Her behavior is normal. Judgment and thought content normal.          Assessment & Plan:

## 2013-03-18 NOTE — Assessment & Plan Note (Signed)
Lab Results  Component Value Date   HGBA1C 7.2* 03/14/2013   Recent A1c more elevated than previous. Suspect related to patient's inability to exercise recently because of plantar fasciitis. Encouraged physical activity as tolerated and low glycemic index diet. We'll plan to repeat A1c in January 2013. If no improvement, consider adding glipizide.

## 2013-03-18 NOTE — Assessment & Plan Note (Signed)
Lipids well-controlled on atorvastatin. Will continue. 

## 2013-03-21 ENCOUNTER — Telehealth: Payer: Self-pay | Admitting: Internal Medicine

## 2013-03-21 ENCOUNTER — Telehealth: Payer: Self-pay | Admitting: Emergency Medicine

## 2013-03-21 DIAGNOSIS — R1011 Right upper quadrant pain: Secondary | ICD-10-CM

## 2013-03-21 NOTE — Telephone Encounter (Signed)
Spoke with patient and informed her of the results, per patient she does not want to have any additional testing done at this time. She had all these same test done about 2 years ago while she was in [place] and everything came back normal. As long as she is feeling fine and does not have any symptoms she will pass but if she began having any symptoms she will give [place] a call back. But for right now it is getting too expensive and declines any further testing. Will call back at a later to schedule a follow up appointment with Dr. Dan Humphreys in April for her diabetes.

## 2013-03-21 NOTE — Telephone Encounter (Signed)
US abdomen showed distended gallbladder but no stones or sludge. I would like to get HIDA scan for further evaluation of the gallbladder. I will place order for this.

## 2013-03-21 NOTE — Telephone Encounter (Signed)
Apt cancelled

## 2013-03-21 NOTE — Telephone Encounter (Signed)
Spoke with [facility] Scheduling, they have scheduled pt cor Hida w/CCK, on 03/26/13 @ 10am. Arriving at the [facility], located at [address] @ [id number]. NPO 6 hours prior, including medications. Do not smoke or chew gum prior to the procedure. Please send patient to me once you have talked with her.

## 2013-04-03 ENCOUNTER — Encounter: Payer: Self-pay | Admitting: Internal Medicine

## 2013-07-23 ENCOUNTER — Telehealth: Payer: Self-pay | Admitting: Internal Medicine

## 2013-07-23 DIAGNOSIS — E119 Type 2 diabetes mellitus without complications: Secondary | ICD-10-CM

## 2013-07-23 NOTE — Telephone Encounter (Signed)
Pt calling to see if she needs to come in for labs before 4/7 appt.  No orders in.  Please advise.

## 2013-07-23 NOTE — Telephone Encounter (Signed)
Please advise 

## 2013-07-24 NOTE — Telephone Encounter (Signed)
Yes, CMP, A1c, lipids, urine microalbumin/cr, 250.00

## 2013-07-26 NOTE — Telephone Encounter (Signed)
Noted  

## 2013-07-26 NOTE — Telephone Encounter (Signed)
I have scheduled the lab appointment for 08/28/2013. The patient would like to know if she could have a referral to the nutritionist  at [facility]. Please advise.

## 2013-07-26 NOTE — Telephone Encounter (Signed)
Order placed for nutrition referral.  

## 2013-08-03 LAB — HM DIABETES EYE EXAM

## 2013-08-28 ENCOUNTER — Ambulatory Visit: Payer: Commercial Managed Care - HMO

## 2013-08-28 ENCOUNTER — Other Ambulatory Visit (INDEPENDENT_AMBULATORY_CARE_PROVIDER_SITE_OTHER): Payer: Commercial Managed Care - HMO

## 2013-08-28 DIAGNOSIS — Z0000 Encounter for general adult medical examination without abnormal findings: Secondary | ICD-10-CM

## 2013-08-28 DIAGNOSIS — R200 Anesthesia of skin: Secondary | ICD-10-CM

## 2013-08-28 DIAGNOSIS — R51 Headache: Secondary | ICD-10-CM

## 2013-08-28 DIAGNOSIS — E119 Type 2 diabetes mellitus without complications: Secondary | ICD-10-CM

## 2013-08-28 LAB — COMPREHENSIVE METABOLIC PANEL
ALBUMIN: 4.5 g/dL (ref 3.5–5.2)
ALT: 52 U/L — AB (ref 0–35)
AST: 38 U/L — ABNORMAL HIGH (ref 0–37)
Alkaline Phosphatase: 60 U/L (ref 39–117)
BILIRUBIN TOTAL: 0.9 mg/dL (ref 0.3–1.2)
BUN: 19 mg/dL (ref 6–23)
CO2: 26 meq/L (ref 19–32)
Calcium: 9.6 mg/dL (ref 8.4–10.5)
Chloride: 104 mEq/L (ref 96–112)
Creatinine, Ser: 0.8 mg/dL (ref 0.4–1.2)
GFR: 77.46 mL/min (ref >60.00)
Glucose, Bld: 159 mg/dL — ABNORMAL HIGH (ref 70–99)
POTASSIUM: 4.1 meq/L (ref 3.5–5.1)
Sodium: 138 mEq/L (ref 135–145)
Total Protein: 7.3 g/dL (ref 6.0–8.3)

## 2013-08-28 LAB — MICROALBUMIN / CREATININE URINE RATIO
Creatinine,U: 165.2 mg/dL
MICROALB/CREAT RATIO: 3.7 mg/g (ref 0.0–30.0)
Microalb, Ur: 6.1 mg/dL — ABNORMAL HIGH (ref 0.0–1.9)

## 2013-08-28 LAB — LIPID PANEL
Cholesterol: 135 mg/dL (ref 0–200)
HDL: 42.7 mg/dL (ref >39.00)
LDL CALC: 72 mg/dL (ref 0–99)
Total CHOL/HDL Ratio: 3
Triglycerides: 101 mg/dL (ref 0.0–149.0)
VLDL: 20.2 mg/dL (ref 0.0–40.0)

## 2013-08-28 LAB — CHOLESTEROL, TOTAL: CHOLESTEROL: 133 mg/dL (ref 0–200)

## 2013-08-28 LAB — HEMOGLOBIN A1C: Hgb A1c MFr Bld: 7.5 % — ABNORMAL HIGH (ref 4.6–6.5)

## 2013-09-02 ENCOUNTER — Ambulatory Visit: Payer: Medicare PPO | Admitting: Internal Medicine

## 2013-09-03 ENCOUNTER — Encounter: Payer: Self-pay | Admitting: Internal Medicine

## 2013-09-03 ENCOUNTER — Ambulatory Visit (INDEPENDENT_AMBULATORY_CARE_PROVIDER_SITE_OTHER): Payer: Commercial Managed Care - HMO | Admitting: Internal Medicine

## 2013-09-03 VITALS — BP 120/70 | HR 99 | Temp 97.6°F | Wt 245.0 lb

## 2013-09-03 DIAGNOSIS — E669 Obesity, unspecified: Secondary | ICD-10-CM

## 2013-09-03 DIAGNOSIS — E785 Hyperlipidemia, unspecified: Secondary | ICD-10-CM

## 2013-09-03 DIAGNOSIS — R1011 Right upper quadrant pain: Secondary | ICD-10-CM

## 2013-09-03 DIAGNOSIS — E119 Type 2 diabetes mellitus without complications: Secondary | ICD-10-CM

## 2013-09-03 DIAGNOSIS — I10 Essential (primary) hypertension: Secondary | ICD-10-CM

## 2013-09-03 LAB — HM DIABETES FOOT EXAM: HM DIABETIC FOOT EXAM: NORMAL

## 2013-09-03 MED ORDER — ATORVASTATIN CALCIUM 20 MG PO TABS: 20.0000 mg | ORAL_TABLET | Freq: Every day | ORAL | Status: DC

## 2013-09-03 MED ORDER — METFORMIN HCL ER 500 MG PO TB24: 500.0000 mg | ORAL_TABLET | Freq: Two times a day (BID) | ORAL | Status: DC

## 2013-09-03 MED ORDER — METOPROLOL SUCCINATE ER 50 MG PO TB24: 50.0000 mg | ORAL_TABLET | Freq: Every day | ORAL | Status: DC

## 2013-09-03 MED ORDER — HYDROCHLOROTHIAZIDE 25 MG PO TABS: 25.0000 mg | ORAL_TABLET | Freq: Every day | ORAL | Status: DC

## 2013-09-03 NOTE — Assessment & Plan Note (Signed)
BP Readings from Last 3 Encounters:  09/03/13 120/70  03/18/13 150/82  09/13/12 136/84   BP well controlled on current medications. Pt was intolerant to ACEi in the past. Recent renal function normal.

## 2013-09-03 NOTE — Progress Notes (Signed)
Pre visit review using our clinic review tool, if applicable. No additional management support is needed unless otherwise documented below in the visit note. 

## 2013-09-03 NOTE — Assessment & Plan Note (Signed)
Recent A1c increased slightly compared to previous. Pt has signed up with nutritionist and plans to get back on low glycemic index diet. Will plan repeat A1c in 6 months (she declines 3 month follow up).

## 2013-09-03 NOTE — Assessment & Plan Note (Signed)
Wt Readings from Last 3 Encounters:  09/03/13 245 lb (111.131 kg)  03/18/13 244 lb (110.678 kg)  09/13/12 239 lb (108.41 kg)   Encouraged effort at weight loss with healthy diet and regular exercise.

## 2013-09-03 NOTE — Assessment & Plan Note (Signed)
Recent lipids well controlled on Atorvastatin. Will continue.

## 2013-09-03 NOTE — Progress Notes (Signed)
Subjective:    Patient ID: Gabriela Reed, female    DOB: 03/30/1941, 73 y.o.   MRN: [id number]  HPI 72YO female with DM presents for follow up.  DM - Following with a nutritionist. No BG over 200.  Exercising with Zumba and aerobics. Compliant with medications. Notes recent dietary indiscretion.  No new concerns today. Continues to have some chronic intermittent right upper quadrant pain, but has declined further evaluation for this.   Review of Systems  Constitutional: Negative for fever, chills, appetite change, fatigue and unexpected weight change.  HENT: Negative for congestion, ear pain, sinus pressure, sore throat, trouble swallowing and voice change.   Eyes: Negative for visual disturbance.  Respiratory: Negative for cough, shortness of breath, wheezing and stridor.   Cardiovascular: Negative for chest pain, palpitations and leg swelling.  Gastrointestinal: Positive for abdominal pain (intermittent RUQ, after eating large meals). Negative for nausea, vomiting, diarrhea, constipation, blood in stool, abdominal distention and anal bleeding.  Genitourinary: Negative for dysuria and flank pain.  Musculoskeletal: Negative for arthralgias, gait problem, myalgias and neck pain.  Skin: Negative for color change and rash.  Neurological: Negative for dizziness and headaches.  Hematological: Negative for adenopathy. Does not bruise/bleed easily.  Psychiatric/Behavioral: Negative for suicidal ideas, sleep disturbance and dysphoric mood. The patient is not nervous/anxious.        Objective:    BP 120/70  Pulse 99  Temp(Src) 97.6 F (36.4 C) (Oral)  Wt 245 lb (111.131 kg)  SpO2 96% Physical Exam  Constitutional: She is oriented to person, place, and time. She appears well-developed and well-nourished. No distress.  HENT:  Head: Normocephalic and atraumatic.  Right Ear: External ear normal.  Left Ear: External ear normal.  Nose: Nose normal.  Mouth/Throat: Oropharynx is clear  and moist. No oropharyngeal exudate.  Eyes: Conjunctivae are normal. Pupils are equal, round, and reactive to light. Right eye exhibits no discharge. Left eye exhibits no discharge. No scleral icterus.  Neck: Normal range of motion. Neck supple. No tracheal deviation present. No thyromegaly present.  Cardiovascular: Normal rate, regular rhythm, normal heart sounds and intact distal pulses.  Exam reveals no gallop and no friction rub.   No murmur heard. Pulmonary/Chest: Effort normal and breath sounds normal. No accessory muscle usage. Not tachypneic. No respiratory distress. She has no decreased breath sounds. She has no wheezes. She has no rhonchi. She has no rales. She exhibits no tenderness.  Abdominal: Soft. Bowel sounds are normal. She exhibits no distension and no mass. There is no tenderness. There is no rebound and no guarding.  Musculoskeletal: Normal range of motion. She exhibits no edema and no tenderness.  Lymphadenopathy:    She has no cervical adenopathy.  Neurological: She is alert and oriented to person, place, and time. No cranial nerve deficit. She exhibits normal muscle tone. Coordination normal.  Skin: Skin is warm and dry. No rash noted. She is not diaphoretic. No erythema. No pallor.  Psychiatric: She has a normal mood and affect. Her behavior is normal. Judgment and thought content normal.          Assessment & Plan:   Problem List Items Addressed This Visit   Abdominal pain, right upper quadrant     RUQ US was normal. Recommended HIDA scan, however pt declines.    Diabetes mellitus type 2, controlled - Primary     Recent A1c increased slightly compared to previous. Pt has signed up with nutritionist and plans to get back on low glycemic index  diet. Will plan repeat A1c in 6 months (she declines 3 month follow up).    Relevant Medications      atorvastatin (LIPITOR) tablet      metFORMIN (GLUCOPHAGE-XR) 24 hr tablet   Hyperlipidemia LDL goal < 70     Recent  lipids well controlled on Atorvastatin. Will continue.    Relevant Medications      hydrochlorothiazide tablet      metoprolol succinate (TOPROL-XL) 24 hr tablet   Hypertension      BP Readings from Last 3 Encounters:  09/03/13 120/70  03/18/13 150/82  09/13/12 136/84   BP well controlled on current medications. Pt was intolerant to ACEi in the past. Recent renal function normal.    Obesity (BMI 30-39.9)      Wt Readings from Last 3 Encounters:  09/03/13 245 lb (111.131 kg)  03/18/13 244 lb (110.678 kg)  09/13/12 239 lb (108.41 kg)   Encouraged effort at weight loss with healthy diet and regular exercise.        Return in about 6 months (around 03/05/2014) for Wellness Visit.

## 2013-09-03 NOTE — Assessment & Plan Note (Signed)
RUQ US was normal. Recommended HIDA scan, however pt declines.

## 2013-09-04 ENCOUNTER — Telehealth: Payer: Self-pay | Admitting: Internal Medicine

## 2013-09-04 NOTE — Telephone Encounter (Signed)
Relevant patient education assigned to patient using Emmi. ° °

## 2013-09-05 ENCOUNTER — Ambulatory Visit: Payer: Self-pay | Admitting: Internal Medicine

## 2013-09-17 ENCOUNTER — Telehealth: Payer: Self-pay

## 2013-09-17 NOTE — Telephone Encounter (Signed)
Relevant patient education assigned to patient using Emmi. ° °

## 2013-09-27 ENCOUNTER — Ambulatory Visit: Payer: Self-pay | Admitting: Internal Medicine

## 2014-03-05 ENCOUNTER — Ambulatory Visit (INDEPENDENT_AMBULATORY_CARE_PROVIDER_SITE_OTHER)
Admission: RE | Admit: 2014-03-05 | Discharge: 2014-03-05 | Disposition: A | Discharge status: Home / Self Care | Payer: Commercial Managed Care - HMO | Source: Ambulatory Visit | Attending: Internal Medicine | Admitting: Internal Medicine

## 2014-03-05 ENCOUNTER — Telehealth: Payer: Self-pay | Admitting: Internal Medicine

## 2014-03-05 ENCOUNTER — Other Ambulatory Visit: Payer: Self-pay | Admitting: Internal Medicine

## 2014-03-05 ENCOUNTER — Encounter: Payer: Self-pay | Admitting: Internal Medicine

## 2014-03-05 ENCOUNTER — Ambulatory Visit (INDEPENDENT_AMBULATORY_CARE_PROVIDER_SITE_OTHER): Payer: Commercial Managed Care - HMO | Admitting: Internal Medicine

## 2014-03-05 VITALS — BP 162/88 | HR 82 | Temp 98.3°F | Ht 63.75 in | Wt 247.5 lb

## 2014-03-05 DIAGNOSIS — M545 Low back pain, unspecified: Secondary | ICD-10-CM

## 2014-03-05 DIAGNOSIS — E119 Type 2 diabetes mellitus without complications: Secondary | ICD-10-CM

## 2014-03-05 DIAGNOSIS — Z0000 Encounter for general adult medical examination without abnormal findings: Secondary | ICD-10-CM

## 2014-03-05 DIAGNOSIS — R1011 Right upper quadrant pain: Secondary | ICD-10-CM

## 2014-03-05 DIAGNOSIS — I10 Essential (primary) hypertension: Secondary | ICD-10-CM

## 2014-03-05 DIAGNOSIS — M2550 Pain in unspecified joint: Secondary | ICD-10-CM

## 2014-03-05 DIAGNOSIS — Z23 Encounter for immunization: Secondary | ICD-10-CM

## 2014-03-05 LAB — MICROALBUMIN / CREATININE URINE RATIO
Creatinine,U: 86.6 mg/dL
Microalb Creat Ratio: 4 mg/g (ref 0.0–30.0)
Microalb, Ur: 3.5 mg/dL — ABNORMAL HIGH (ref 0.0–1.9)

## 2014-03-05 LAB — COMPREHENSIVE METABOLIC PANEL
ALBUMIN: 4.1 g/dL (ref 3.5–5.2)
ALK PHOS: 68 U/L (ref 39–117)
ALT: 51 U/L — AB (ref 0–35)
AST: 32 U/L (ref 0–37)
BUN: 19 mg/dL (ref 6–23)
CO2: 28 mEq/L (ref 19–32)
CREATININE: 0.7 mg/dL (ref 0.4–1.2)
Calcium: 9.7 mg/dL (ref 8.4–10.5)
Chloride: 101 mEq/L (ref 96–112)
GFR: 92.17 mL/min (ref >60.00)
Glucose, Bld: 171 mg/dL — ABNORMAL HIGH (ref 70–99)
POTASSIUM: 4.5 meq/L (ref 3.5–5.1)
Sodium: 138 mEq/L (ref 135–145)
Total Bilirubin: 0.8 mg/dL (ref 0.2–1.2)
Total Protein: 7.9 g/dL (ref 6.0–8.3)

## 2014-03-05 LAB — LIPID PANEL
CHOLESTEROL: 150 mg/dL (ref 0–200)
HDL: 42.4 mg/dL (ref >39.00)
LDL Cholesterol: 87 mg/dL (ref 0–99)
NonHDL: 107.6
Total CHOL/HDL Ratio: 4
Triglycerides: 103 mg/dL (ref 0.0–149.0)
VLDL: 20.6 mg/dL (ref 0.0–40.0)

## 2014-03-05 LAB — RHEUMATOID FACTOR: Rhuematoid fact SerPl-aCnc: 14 IU/mL (ref <=14)

## 2014-03-05 LAB — CBC WITH DIFFERENTIAL/PLATELET
BASOS PCT: 0.5 % (ref 0.0–3.0)
Basophils Absolute: 0 10*3/uL (ref 0.0–0.1)
EOS PCT: 2.8 % (ref 0.0–5.0)
Eosinophils Absolute: 0.1 10*3/uL (ref 0.0–0.7)
HCT: 44 % (ref 36.0–46.0)
Hemoglobin: 14.4 g/dL (ref 12.0–15.0)
Lymphocytes Relative: 41.7 % (ref 12.0–46.0)
Lymphs Abs: 2.1 10*3/uL (ref 0.7–4.0)
MCHC: 32.8 g/dL (ref 30.0–36.0)
MCV: 88.5 fl (ref 78.0–100.0)
MONO ABS: 0.4 10*3/uL (ref 0.1–1.0)
MONOS PCT: 7.6 % (ref 3.0–12.0)
NEUTROS PCT: 47.4 % (ref 43.0–77.0)
Neutro Abs: 2.4 10*3/uL (ref 1.4–7.7)
Platelets: 178 10*3/uL (ref 150.0–400.0)
RBC: 4.97 Mil/uL (ref 3.87–5.11)
RDW: 12.7 % (ref 11.5–15.5)
WBC: 5 10*3/uL (ref 4.0–10.5)

## 2014-03-05 LAB — SEDIMENTATION RATE: SED RATE: 25 mm/h — AB (ref 0–22)

## 2014-03-05 LAB — TSH: TSH: 4.33 u[IU]/mL (ref 0.35–4.50)

## 2014-03-05 LAB — C-REACTIVE PROTEIN

## 2014-03-05 LAB — HM MAMMOGRAPHY

## 2014-03-05 LAB — HEMOGLOBIN A1C: Hgb A1c MFr Bld: 8.1 % — ABNORMAL HIGH (ref 4.6–6.5)

## 2014-03-05 NOTE — Assessment & Plan Note (Signed)
General medical exam normal today except as noted. Pt declines breast exam, mammogram, pelvic exam.  Colonoscopy declined. Will check labs today including CBC, CMP, lipids, TSH, A1c. Flu vaccine and Prevnar vaccine today.

## 2014-03-05 NOTE — Progress Notes (Signed)
The patient is here for annual Medicare Wellness Examination and management of other chronic and acute problems.   The risk factors are reflected in the history.  The roster of all physicians providing medical care to patient - is listed in the Snapshot section of the chart.  Activities of daily living:   The patient is 100% independent in all ADLs: dressing, toileting, feeding as well as independent mobility. Patient lives alone in a [place]. Has a cat.  Home safety :  The patient has smoke detectors in the home.  They wear seatbelts in their car. There are no firearms at home.  There is no violence in the home. They feel safe where they live.  Infectious Risks: There is no risks for hepatitis, STDs or HIV.  There is no  history of blood transfusion.  They have no travel history to infectious disease endemic areas of the world.  Additional Health Care Providers: The patient has seen their dentist in the last six months. Dentist - Dr. Truman Hayward They have seen their eye doctor in the last year. Opthalmologist - Dr. Wallace Going They deny hearing issues. They have deferred audiologic testing in the last year.   They do not  have excessive sun exposure. Discussed the need for sun protection: hats,long sleeves and use of sunscreen if there is significant sun exposure.  Dermatologist - none  Diet: the importance of a healthy diet is discussed. They do have a healthy diet.  The benefits of regular aerobic exercise were discussed. Exercise limited by pain.  Depression screen: there are no signs or vegative symptoms of depression- irritability, change in appetite, anhedonia, sadness/tearfullness.  Cognitive assessment: the patient manages all their financial and personal affairs and is actively engaged.   HCPOA - none in place  The following portions of the patient's history were reviewed and updated as appropriate: allergies, current medications, past family history, past medical history,  past  surgical history, past social history and problem list.  Visual acuity was not assessed per patient preference as they have regular follow up with their ophthalmologist. Hearing and body mass index were assessed and reviewed.   During the course of the visit the patient was educated and counseled about appropriate screening and preventive services including : fall prevention , diabetes screening, nutrition counseling, colorectal cancer screening, and recommended immunizations.    She has several concerns today. She reports she is generally feeling poorly and that something "must be wrong."  Last year, developed right upper abdominal and flank pain. Had US gallbladder which showed distended gallbladder but no inflammation or stones. She canceled follow up HIDA scan. Continues to have pain. Pain is not always associated with eating. No improvement with avoiding fatty foods. No nausea. Pain is located in the right upper abdomen and radiates around to the right flank. No change in BMs. No urinary symptoms. No fever, chills. She feels that abdomen may be larger than previous. She has declined colonoscopy.  Concerned about diffuse arthralgia including lower back and hip pain. Limiting her exercise. No swelling in joints. Not taking anything for this. No weakness or numbness.  DM - notes that BG have been more elevated recently but has not been checking blood sugars. Reports compliance with her medications.  Review of Systems  Constitutional: Negative for fever, chills, appetite change, fatigue and unexpected weight change.  Eyes: Negative for visual disturbance.  Respiratory: Negative for shortness of breath.   Cardiovascular: Negative for chest pain and leg swelling.  Gastrointestinal: Positive for  abdominal pain.  Musculoskeletal: Positive for arthralgias, back pain and myalgias.  Skin: Negative for color change and rash.  Hematological: Negative for adenopathy. Does not bruise/bleed easily.   Psychiatric/Behavioral: Negative for dysphoric mood. The patient is not nervous/anxious.        Objective:    BP 162/88  Pulse 82  Temp(Src) 98.3 F (36.8 C) (Oral)  Ht 5' 3.75" (1.619 m)  Wt 247 lb 8 oz (112.265 kg)  BMI 42.83 kg/m2  SpO2 96% Physical Exam  Constitutional: She is oriented to person, place, and time. She appears well-developed and well-nourished. No distress.  HENT:  Head: Normocephalic and atraumatic.  Right Ear: External ear normal.  Left Ear: External ear normal.  Nose: Nose normal.  Mouth/Throat: Oropharynx is clear and moist. No oropharyngeal exudate.  Eyes: Conjunctivae and EOM are normal. Pupils are equal, round, and reactive to light. Right eye exhibits no discharge. Left eye exhibits no discharge. No scleral icterus.  Neck: Normal range of motion. Neck supple. No tracheal deviation present. No thyromegaly present.  Cardiovascular: Normal rate, regular rhythm, normal heart sounds and intact distal pulses.  Exam reveals no gallop and no friction rub.   No murmur heard. Pulmonary/Chest: Effort normal and breath sounds normal. No accessory muscle usage. Not tachypneic. No respiratory distress. She has no decreased breath sounds. She has no wheezes. She has no rhonchi. She has no rales. She exhibits no tenderness.  Abdominal: Soft. Bowel sounds are normal. She exhibits no distension and no mass. There is no tenderness. There is no rebound and no guarding.  Musculoskeletal: She exhibits no edema and no tenderness.       Lumbar back: She exhibits pain. She exhibits normal range of motion, no tenderness, no bony tenderness, no edema and no spasm.  Lymphadenopathy:    She has no cervical adenopathy.  Neurological: She is alert and oriented to person, place, and time. No cranial nerve deficit. She exhibits normal muscle tone. Coordination normal.  Skin: Skin is warm and dry. No rash noted. She is not diaphoretic. No erythema. No pallor.  Psychiatric: She has a  normal mood and affect. Her behavior is normal. Judgment and thought content normal.          Assessment & Plan:   Problem List Items Addressed This Visit     Unprioritized   Abdominal pain, right upper quadrant     Persistent RUQ abdominal pain >1 year. US abdomen last year was abnormal with distension of the gallbladder. Pt declined HIDA or other additional imaging at that time. Will set up HIDA scan. If normal, consider CT abdomen and possible referral to GI for upper endoscopy.    Relevant Orders      CBC with Differential      NM HEPATOBILIARY INCLUDING GB   Arthralgia     Diffuse arthralgia noted by pt. 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 Given symptoms of pain in lower back persistent >1 month with no improvement with water exercise, will get plain xray for evaluation.    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 Given persistence of symptoms, however, will get plain xray of lumbar spine. Tylenol prn for pain.    Relevant Orders      DG Lumbar Spine Complete  Diabetes mellitus type 2, controlled      Lab Results  Component Value Date   HGBA1C 7.5* 08/28/2013   Will recheck A1c with labs today. Continue metformin.     Relevant Orders      Comprehensive metabolic panel      Hemoglobin A1c      Lipid panel      Microalbumin / creatinine urine ratio   Hypertension      BP Readings from Last 3 Encounters:  03/05/14 162/88  09/03/13 120/70  03/18/13 150/82   BP elevated today. Will check renal function with labs. Intolerant to ACEi. Recheck BP in 2 weeks.    Medicare annual wellness visit, subsequent - Primary     General medical exam normal today except as noted. Pt declines breast exam, mammogram, pelvic exam.  Colonoscopy declined. Will check labs today including CBC, CMP, lipids, TSH, A1c. Flu vaccine and Prevnar  vaccine today.    Morbid obesity      Wt Readings from Last 3 Encounters:  03/05/14 247 lb 8 oz (112.265 kg)  09/03/13 245 lb (111.131 kg)  03/18/13 244 lb (110.678 kg)   Body mass index is 42.83 kg/(m^2). Encouraged healthy diet and exercise as tolerated with goal of weight loss.    Relevant Orders      TSH    Other Visit Diagnoses   Encounter for immunization        Need for prophylactic vaccination against Streptococcus pneumoniae (pneumococcus)        Relevant Orders       Pneumococcal conjugate vaccine 13-valent (Completed)        Return in about 2 weeks (around 03/19/2014) for Recheck.

## 2014-03-05 NOTE — Progress Notes (Signed)
Pre visit review using our clinic review tool, if applicable. No additional management support is needed unless otherwise documented below in the visit note. 

## 2014-03-05 NOTE — Assessment & Plan Note (Signed)
Persistent RUQ abdominal pain >1 year. US abdomen last year was abnormal with distension of the gallbladder. Pt declined HIDA or other additional imaging at that time. Will set up HIDA scan. If normal, consider CT abdomen and possible referral to GI for upper endoscopy.

## 2014-03-05 NOTE — Assessment & Plan Note (Signed)
BP Readings from Last 3 Encounters:  03/05/14 162/88  09/03/13 120/70  03/18/13 150/82   BP elevated today. Will check renal function with labs. Intolerant to ACEi. Recheck BP in 2 weeks.

## 2014-03-05 NOTE — Telephone Encounter (Signed)
Oops, disregard message Dr. Dan HumphreysWalker. Error in routing

## 2014-03-05 NOTE — Assessment & Plan Note (Signed)
Diffuse arthralgia noted by pt. 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 Given symptoms of pain in lower back persistent >1 month with no improvement with water exercise, will get plain xray for evaluation.

## 2014-03-05 NOTE — Telephone Encounter (Signed)
What about 03/19/14 @ 7:45? I blocked spot if you can use it

## 2014-03-05 NOTE — Telephone Encounter (Signed)
Pt needs 2wk follow up. Please advise where to add pt.msn

## 2014-03-05 NOTE — Assessment & Plan Note (Signed)
Lab Results  Component Value Date   HGBA1C 7.5* 08/28/2013   Will recheck A1c with labs today. Continue metformin.

## 2014-03-05 NOTE — Addendum Note (Signed)
Addended by: Ronna PolioWALKER, Korinne Greenstein A on: 03/05/2014 08:54 PM   Modules accepted: Orders

## 2014-03-05 NOTE — Assessment & Plan Note (Signed)
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 Given persistence of symptoms, however, will get plain xray of lumbar spine. Tylenol prn for pain.

## 2014-03-05 NOTE — Patient Instructions (Signed)
We will set up a HIDA scan to evaluate your gallbladder. Please go to our [place] office to get an xray of your lower back. Follow up in 2 weeks.  Health Maintenance Adopting a healthy lifestyle and getting preventive care can go a long way to promote health and wellness. Talk with your health care provider about what schedule of regular examinations is right for you. This is a good chance for you to check in with your provider about disease prevention and staying healthy. In between checkups, there are plenty of things you can do on your own. Experts have done a lot of research about which lifestyle changes and preventive measures are most likely to keep you healthy. Ask your health care provider for more information. WEIGHT AND DIET  Eat a healthy diet  Be sure to include plenty of vegetables, fruits, low-fat dairy products, and lean protein.  Do not eat a lot of foods high in solid fats, added sugars, or salt.  Get regular exercise. This is one of the most important things you can do for your health.  Most adults should exercise for at least 150 minutes each week. The exercise should increase your heart rate and make you sweat (moderate-intensity exercise).  Most adults should also do strengthening exercises at least twice a week. This is in addition to the moderate-intensity exercise.  Maintain a healthy weight  Body mass index (BMI) is a measurement that can be used to identify possible weight problems. It estimates body fat based on height and weight. Your health care provider can help determine your BMI and help you achieve or maintain a healthy weight.  For females 80 years of age and older:   A BMI below 18.5 is considered underweight.  A BMI of 18.5 to 24.9 is normal.  A BMI of 25 to 29.9 is considered overweight.  A BMI of 30 and above is considered obese.  Watch levels of cholesterol and blood lipids  You should start having your blood tested for lipids and  cholesterol at 71 years of age, then have this test every 5 years.  You may need to have your cholesterol levels checked more often if:  Your lipid or cholesterol levels are high.  You are older than 71 years of age.  You are at high risk for heart disease.  CANCER SCREENING   Lung Cancer  Lung cancer screening is recommended for adults 45-18 years old who are at high risk for lung cancer because of a history of smoking.  A yearly low-dose CT scan of the lungs is recommended for people who:  Currently smoke.  Have quit within the past 15 years.  Have at least a 30-pack-year history of smoking. A pack year is smoking an average of one pack of cigarettes a day for 1 year.  Yearly screening should continue until it has been 15 years since you quit.  Yearly screening should stop if you develop a health problem that would prevent you from having lung cancer treatment.  Breast Cancer  Practice breast self-awareness. This means understanding how your breasts normally appear and feel.  It also means doing regular breast self-exams. Let your health care provider know about any changes, no matter how small.  If you are in your 20s or 30s, you should have a clinical breast exam (CBE) by a health care provider every 1-3 years as part of a regular health exam.  If you are 5 or older, have a CBE every year.  Also consider having a breast X-ray (mammogram) every year.  If you have a family history of breast cancer, talk to your health care provider about genetic screening.  If you are at high risk for breast cancer, talk to your health care provider about having an MRI and a mammogram every year.  Breast cancer gene (BRCA) assessment is recommended for women who have family members with BRCA-related cancers. BRCA-related cancers include:  Breast.  Ovarian.  Tubal.  Peritoneal cancers.  Results of the assessment will determine the need for genetic counseling and BRCA1 and BRCA2  testing. Cervical Cancer Routine pelvic examinations to screen for cervical cancer are no longer recommended for nonpregnant women who are considered low risk for cancer of the pelvic organs (ovaries, uterus, and vagina) and who do not have symptoms. A pelvic examination may be necessary if you have symptoms including those associated with pelvic infections. Ask your health care provider if a screening pelvic exam is right for you.   The Pap test is the screening test for cervical cancer for women who are considered at risk.  If you had a hysterectomy for a problem that was not cancer or a condition that could lead to cancer, then you no longer need Pap tests.  If you are older than 65 years, and you have had normal Pap tests for the past 10 years, you no longer need to have Pap tests.  If you have had past treatment for cervical cancer or a condition that could lead to cancer, you need Pap tests and screening for cancer for at least 20 years after your treatment.  If you no longer get a Pap test, assess your risk factors if they change (such as having a new sexual partner). This can affect whether you should start being screened again.  Some women have medical problems that increase their chance of getting cervical cancer. If this is the case for you, your health care provider may recommend more frequent screening and Pap tests.  The human papillomavirus (HPV) test is another test that may be used for cervical cancer screening. The HPV test looks for the virus that can cause cell changes in the cervix. The cells collected during the Pap test can be tested for HPV.  The HPV test can be used to screen women 101 years of age and older. Getting tested for HPV can extend the interval between normal Pap tests from three to five years.  An HPV test also should be used to screen women of any age who have unclear Pap test results.  After 71 years of age, women should have HPV testing as often as Pap  tests.  Colorectal Cancer  This type of cancer can be detected and often prevented.  Routine colorectal cancer screening usually begins at 71 years of age and continues through 71 years of age.  Your health care provider may recommend screening at an earlier age if you have risk factors for colon cancer.  Your health care provider may also recommend using home test kits to check for hidden blood in the stool.  A small camera at the end of a tube can be used to examine your colon directly (sigmoidoscopy or colonoscopy). This is done to check for the earliest forms of colorectal cancer.  Routine screening usually begins at age 17.  Direct examination of the colon should be repeated every 5-10 years through 71 years of age. However, you may need to be screened more often if early forms  of precancerous polyps or small growths are found. Skin Cancer  Check your skin from head to toe regularly.  Tell your health care provider about any new moles or changes in moles, especially if there is a change in a mole's shape or color.  Also tell your health care provider if you have a mole that is larger than the size of a pencil eraser.  Always use sunscreen. Apply sunscreen liberally and repeatedly throughout the day.  Protect yourself by wearing long sleeves, pants, a wide-brimmed hat, and sunglasses whenever you are outside. HEART DISEASE, DIABETES, AND HIGH BLOOD PRESSURE   Have your blood pressure checked at least every 1-2 years. High blood pressure causes heart disease and increases the risk of stroke.  If you are between 17 years and 82 years old, ask your health care provider if you should take aspirin to prevent strokes.  Have regular diabetes screenings. This involves taking a blood sample to check your fasting blood sugar level.  If you are at a normal weight and have a low risk for diabetes, have this test once every three years after 71 years of age.  If you are overweight and  have a high risk for diabetes, consider being tested at a younger age or more often. PREVENTING INFECTION  Hepatitis B  If you have a higher risk for hepatitis B, you should be screened for this virus. You are considered at high risk for hepatitis B if:  You were born in a country where hepatitis B is common. Ask your health care provider which countries are considered high risk.  Your parents were born in a high-risk country, and you have not been immunized against hepatitis B (hepatitis B vaccine).  You have HIV or AIDS.  You use needles to inject street drugs.  You live with someone who has hepatitis B.  You have had sex with someone who has hepatitis B.  You get hemodialysis treatment.  You take certain medicines for conditions, including cancer, organ transplantation, and autoimmune conditions. Hepatitis C  Blood testing is recommended for:  Everyone born from [birth year] through 1963.  Anyone with known risk factors for hepatitis C. Sexually transmitted infections (STIs)  You should be screened for sexually transmitted infections (STIs) including gonorrhea and chlamydia if:  You are sexually active and are younger than 71 years of age.  You are older than 71 years of age and your health care provider tells you that you are at risk for this type of infection.  Your sexual activity has changed since you were last screened and you are at an increased risk for chlamydia or gonorrhea. Ask your health care provider if you are at risk.  If you do not have HIV, but are at risk, it may be recommended that you take a prescription medicine daily to prevent HIV infection. This is called pre-exposure prophylaxis (PrEP). You are considered at risk if:  You are sexually active and do not regularly use condoms or know the HIV status of your partner(s).  You take drugs by injection.  You are sexually active with a partner who has HIV. Talk with your health care provider about whether you  are at high risk of being infected with HIV. If you choose to begin PrEP, you should first be tested for HIV. You should then be tested every 3 months for as long as you are taking PrEP.  PREGNANCY   If you are premenopausal and you may become pregnant, ask your health  care provider about preconception counseling.  If you may become pregnant, take 400 to 800 micrograms (mcg) of folic acid every day.  If you want to prevent pregnancy, talk to your health care provider about birth control (contraception). OSTEOPOROSIS AND MENOPAUSE   Osteoporosis is a disease in which the bones lose minerals and strength with aging. This can result in serious bone fractures. Your risk for osteoporosis can be identified using a bone density scan.  If you are 13 years of age or older, or if you are at risk for osteoporosis and fractures, ask your health care provider if you should be screened.  Ask your health care provider whether you should take a calcium or vitamin D supplement to lower your risk for osteoporosis.  Menopause may have certain physical symptoms and risks.  Hormone replacement therapy may reduce some of these symptoms and risks. Talk to your health care provider about whether hormone replacement therapy is right for you.  HOME CARE INSTRUCTIONS   Schedule regular health, dental, and eye exams.  Stay current with your immunizations.   Do not use any tobacco products including cigarettes, chewing tobacco, or electronic cigarettes.  If you are pregnant, do not drink alcohol.  If you are breastfeeding, limit how much and how often you drink alcohol.  Limit alcohol intake to no more than 1 drink per day for nonpregnant women. One drink equals 12 ounces of beer, 5 ounces of wine, or 1 ounces of hard liquor.  Do not use street drugs.  Do not share needles.  Ask your health care provider for help if you need support or information about quitting drugs.  Tell your health care provider if  you often feel depressed.  Tell your health care provider if you have ever been abused or do not feel safe at home. Document Released: 11/29/2010 Document Revised: 09/30/2013 Document Reviewed: 04/17/2013 [facility] Patient Information 2015 [place], [place]. This information is not intended to replace advice given to you by your health care provider. Make sure you discuss any questions you have with your health care provider.

## 2014-03-05 NOTE — Assessment & Plan Note (Signed)
Wt Readings from Last 3 Encounters:  03/05/14 247 lb 8 oz (112.265 kg)  09/03/13 245 lb (111.131 kg)  03/18/13 244 lb (110.678 kg)   Body mass index is 42.83 kg/(m^2). Encouraged healthy diet and exercise as tolerated with goal of weight loss.

## 2014-03-06 ENCOUNTER — Other Ambulatory Visit: Payer: Self-pay | Admitting: Internal Medicine

## 2014-03-06 ENCOUNTER — Telehealth: Payer: Self-pay | Admitting: Internal Medicine

## 2014-03-06 DIAGNOSIS — R1011 Right upper quadrant pain: Principal | ICD-10-CM

## 2014-03-06 DIAGNOSIS — G8929 Other chronic pain: Secondary | ICD-10-CM

## 2014-03-06 LAB — ANA: Anti Nuclear Antibody(ANA): NEGATIVE

## 2014-03-06 NOTE — Telephone Encounter (Signed)
emmi emailed °

## 2014-03-07 NOTE — Telephone Encounter (Signed)
Pt stated that she doesn't want to do physical therapy. Pt stated that she would like to wait to schedule a follow up until she has seen Dr.Phadke and had a hida scan.msn

## 2014-03-14 ENCOUNTER — Telehealth: Payer: Self-pay | Admitting: Internal Medicine

## 2014-03-14 ENCOUNTER — Ambulatory Visit: Payer: Self-pay | Admitting: Internal Medicine

## 2014-03-14 NOTE — Telephone Encounter (Signed)
Sent my chart with results. 

## 2014-03-14 NOTE — Telephone Encounter (Signed)
Abdominal ultrasound showed diffuse fatty infiltration in the liver but no other abnormalities, and no abnormalities of the gallbladder. I would recommend proceeding with HIDA scan as we discussed.

## 2014-03-15 ENCOUNTER — Telehealth: Payer: Self-pay | Admitting: Internal Medicine

## 2014-03-15 NOTE — Telephone Encounter (Signed)
HIDA scan of the gallbladder was normal. I would recommend: 1. Evaluation with GI for fatty liver 2. Follow up in our office, visit  We can place a referral for GI if pt would like.

## 2014-03-18 NOTE — Telephone Encounter (Signed)
Left message, notifying of results. Requested call back regarding appt and referral.

## 2014-03-18 NOTE — Telephone Encounter (Signed)
Left message, notifying of results.  

## 2014-03-19 ENCOUNTER — Ambulatory Visit (INDEPENDENT_AMBULATORY_CARE_PROVIDER_SITE_OTHER): Payer: Commercial Managed Care - HMO | Admitting: Endocrinology

## 2014-03-19 ENCOUNTER — Encounter: Payer: Self-pay | Admitting: Endocrinology

## 2014-03-19 VITALS — BP 132/84 | HR 79 | Wt 241.8 lb

## 2014-03-19 DIAGNOSIS — I10 Essential (primary) hypertension: Secondary | ICD-10-CM

## 2014-03-19 DIAGNOSIS — E119 Type 2 diabetes mellitus without complications: Secondary | ICD-10-CM

## 2014-03-19 DIAGNOSIS — E785 Hyperlipidemia, unspecified: Secondary | ICD-10-CM

## 2014-03-19 LAB — HM DIABETES FOOT EXAM: HM Diabetic Foot Exam: NORMAL

## 2014-03-19 MED ORDER — METFORMIN HCL ER 500 MG PO TB24: ORAL_TABLET | ORAL | Status: DC

## 2014-03-19 NOTE — Progress Notes (Signed)
Reason for visit-  Gabriela Reed is Reed [age] y.o.-year-old female, referred by her PCP,  Shelia Reed, Gabriela A, MD for management of Type 2 diabetes, uncontrolled, without complications. Associated CAD.   HPI- Patient has been diagnosed with diabetes in 2009. Recalls being initially on lifestyle modifications.   she has not been on insulin before. Reports being well controlled since her diagnosis, and had been seeing endo in [place]. Moved to [place] 3 years ago, and hasn't been able to keep up with her lifestyle efforts since then.  Now, after recent appt with PCP she has gotten back to her diet and exercise and lost 5 pounds in the past 2 weeks.    Pt is currently on Reed regimen of: - Metformin XR 500 mg po bid ( tolerating well)    Last hemoglobin A1c was: Lab Results  Component Value Date   HGBA1C 8.1* 03/05/2014   HGBA1C 7.5* 08/28/2013   HGBA1C 7.2* 03/14/2013     Pt checks her sugars every other day now . Uses One Touch mini glucometer. By recall they are: Sugars are improving as her abdominal pain has subsided.   PREMEAL Breakfast Lunch Dinner Bedtime Overall  Glucose range: 136-170s    130-150  Mean/median:         Hypoglycemia-  No lows recently.   Dietary habits- eats three times daily. Tries to limit carbs, sweetened beverages, sodas, desserts. Now working on portion sizes, got off track once she moved here. Went to several DM education  Classes in [place], and knows what to do.  Exercise- [facility] , exercises at gym  4 times weekly , now going to start weight training Weight - down Wt Readings from Last 3 Encounters:  03/19/14 241 lb 12 oz (109.657 kg)  03/05/14 247 lb 8 oz (112.265 kg)  09/03/13 245 lb (111.131 kg)    Diabetes Complications-  Nephropathy- No  CKD, last BUN/creatinine-   Lab Results  Component Value Date   BUN 19 03/05/2014   CREATININE 0.7 03/05/2014   Lab Results  Component Value Date   GFR 92.17  03/05/2014   Lab Results  Component Value Date   MICRALBCREAT 4.0 03/05/2014    Retinopathy- No, Last DEE was this year. Goes back this November.  Neuropathy- no numbness and tingling in her feet. No known neuropathy.  Associated history - Has CAD  S/p stent 2004. No prior stroke. No hypothyroidism. her last TSH was trending up. No hypothyroid symptoms.  Lab Results  Component Value Date   TSH 4.33 03/05/2014    Hyperlipidemia-  her last set of lipids were- Currently on Lipitor 20 mg. Tolerating well.  Of note, has abnormal liver tests with recent workup suggesting likely fatty liver.  Lab Results  Component Value Date   CHOL 150 03/05/2014   HDL 42.40 03/05/2014   LDLCALC 87 03/05/2014   TRIG 103.0 03/05/2014   CHOLHDL 4 03/05/2014   Lab Results  Component Value Date   AST 32 03/05/2014   ALT 51* 03/05/2014      Blood Pressure/HTN- Patient's blood pressure is well controlled today on current regimen. She is allergic to lisinopril due to cough.    Pt has FH of DM in no one.  I have reviewed the patient's past medical history, family and social history, surgical history, medications and allergies.  Past Medical History  Diagnosis Date  . Chicken pox   . Diabetes mellitus   .  Glaucoma   . Heart disease   . Hyperlipidemia   . Hypertension    Past Surgical History  Procedure Laterality Date  . Tonsillectomy and adenoidectomy  1949  . Coronary artery stent  2004   History   Social History  . Marital Status: Divorced    Spouse Name: N/Reed    Number of Children: N/Reed  . Years of Education: N/Reed   Occupational History  . Not on file.   Social History Main Topics  . Smoking status: Former Games developermoker  . Smokeless tobacco: Never Used     Comment: quit 25 years ago  . Alcohol Use: Yes     Comment: very rarely  . Drug Use: No  . Sexual Activity: Not on file   Other Topics Concern  . Not on file   Social History Narrative   Lives in [place]. Cat in home.      Work Surveyor, quantity-  manager at [employer]   Diet - regular   Exercise - [employer]   Current Outpatient Prescriptions on File Prior to Visit  Medication Sig Dispense Refill  . aspirin 81 MG tablet Take 81 mg by mouth daily.      Marland Kitchen. atorvastatin (LIPITOR) 20 MG tablet Take 1 tablet (20 mg total) by mouth daily.  90 tablet  3  . desonide (DESOWEN) 0.05 % cream Apply 1 application topically as needed.  60 g  3  . glucose blood test strip One touch ultra test strips-test blood sugar every other day  100 each  3  . hydrochlorothiazide (HYDRODIURIL) 25 MG tablet Take 1 tablet (25 mg total) by mouth daily.  90 tablet  3  . metoprolol succinate (TOPROL-XL) 50 MG 24 hr tablet Take 1 tablet (50 mg total) by mouth daily. Take with or immediately following Reed meal.  90 tablet  3  . Travoprost, BAK Free, (TRAVATAN) 0.004 % SOLN ophthalmic solution Place 1 drop into the left eye at bedtime.       No current facility-administered medications on file prior to visit.   Allergies  Allergen Reactions  . Niaspan [Niacin Er] Rash  . Lisinopril    Family History  Problem Relation Age of Onset  . Hyperlipidemia Mother   . Hypertension Mother   . Hyperlipidemia Father   . Hypertension Father   . Heart disease Father [id number]      Review of Systems: [x]  complains of  [  ] denies General:   [ x ] Recent weight change [  ] Fatigue  [  ] Loss of appetite Eyes: [  ]  Vision Difficulty [  ]  Eye pain ENT: [  ]  Hearing difficulty [  ]  Difficulty Swallowing CVS: [  ] Chest pain [  ]  Palpitations/Irregular Heart beat [  ]  Shortness of breath lying flat [  ] Swelling of legs Resp: [  ] Frequent Cough [  ] Shortness of Breath  [  ]  Wheezing GI: [  ] Heartburn  [  ] Nausea or Vomiting  [  ] Diarrhea [  ] Constipation  [  ] Abdominal Pain GU: [  ]  Polyuria  [  ]  nocturia Bones/joints:  [  ]  Muscle aches  [  ] Joint Pain  [  ] Bone pain Skin/Hair/Nails: [  ]  Rash  [  ] New stretch marks [  ]  Itching [  ] Hair loss [  ]  Excessive hair  growth Reproduction: [  ] Low sexual desire , [  ]  Women: Menstrual cycle problems [  ]  Women: Breast Discharge [  ] Men: Difficulty with erections [  ]  Men: Enlarged Breasts CNS: [  ] Frequent Headaches [  ] Blurry vision [  ] Tremors [  ] Seizures [  ] Loss of consciousness [  ] Localized weakness Endocrine: [  ]  Excess thirst [  ]  Feeling excessively hot [  ]  Feeling excessively cold Heme: [  ]  Easy bruising [  ]  Enlarged glands or lumps in neck Allergy: [  ]  Food allergies [  ] Environmental allergies  PE: BP 132/84  Pulse 79  Wt 241 lb 12 oz (109.657 kg)  SpO2 97% Wt Readings from Last 3 Encounters:  03/19/14 241 lb 12 oz (109.657 kg)  03/05/14 247 lb 8 oz (112.265 kg)  09/03/13 245 lb (111.131 kg)  Body mass index is 41.84 kg/(m^2).  GENERAL: No acute distress, well developed HEENT:  Eye exam shows normal external appearance. Oral exam shows normal mucosa .  NECK:   Neck exam shows no lymphadenopathy. No Carotids bruits. Thyroid is not enlarged and no nodules felt.  no acanthosis nigricans LUNGS:         Chest is symmetrical. Lungs are clear to auscultation.Marland Kitchen   HEART:         Heart sounds:  S1 and S2 are normal. No murmurs or clicks heard. ABDOMEN:  No Distention present. Liver and spleen are not palpable. No other mass or tenderness present.  EXTREMITIES:     There is no edema. 2+ DP pulses  NEUROLOGICAL:     Grossly intact.            Diabetic foot exam done with shoes and socks removed: Normal Monofilament testing bilaterally. No deformities of toes.  Nails  Not dystrophic. Skin normal color. No open wounds. Dry skin. Calluses left foot > right MUSCULOSKELETAL:       There is no enlargement or gross deformity of the joints.  SKIN:       No rash  ASSESSMENT AND PLAN: 1. Type 2 diabetes mellitus 2. Hyperlipidemia 3. Hypertension 4. Morbid obesity  Problem List Items Addressed This Visit     Cardiovascular and Mediastinum   Hypertension     BP at goal today.  Intolerant to ACE-I. Last urine MA negative 10/15.       Endocrine   Diabetes mellitus type 2, controlled - Primary     Recent A1cs have been trending up. She has now resumed her lifestyle efforts, which I encouraged her to continue. Work on reducing abdominal obesity.  Discussed about medication management between titrating up her Metformin versus alternative second line therapy. She has elected to try an increased dose of metformin. Increase to 500 mg morning and 1000 mg at night time.   Regular self foot care, dietary modifications discussed.  She will start checking her sugars at various time points in the day.   RTC 3 months    Relevant Medications      metFORMIN (GLUCOPHAGE-XR) 24 hr tablet     Other   Hyperlipidemia with target LDL less than 70     Recent LDL <100. Continue statin. Mild LFT abnormality due to likely fatty liver. Weight loss encouraged.     Morbid obesity     Recently has resumed her dietary and exercise efforts. Lost 5 lbs in the past 2  weeks. Congratulated her on effort.  She will continue to work on this. Discussed need to get BMI down to improve DM control.     Relevant Medications      metFORMIN (GLUCOPHAGE-XR) 24 hr tablet       - Return to clinic in 3 mo with sugar log/meter.  [facility] 03/19/2014 2:59 PM

## 2014-03-19 NOTE — Assessment & Plan Note (Signed)
BP at goal today. Intolerant to ACE-I. Last urine MA negative 10/15.

## 2014-03-19 NOTE — Progress Notes (Signed)
Pre visit review using our clinic review tool, if applicable. No additional management support is needed unless otherwise documented below in the visit note. 

## 2014-03-19 NOTE — Assessment & Plan Note (Signed)
Recent LDL <100. Continue statin. Mild LFT abnormality due to likely fatty liver. Weight loss encouraged.

## 2014-03-19 NOTE — Assessment & Plan Note (Addendum)
Recently has resumed her dietary and exercise efforts. Lost 5 lbs in the past 2 weeks. Congratulated her on effort.  She will continue to work on this. Discussed need to get BMI down to improve DM control.

## 2014-03-19 NOTE — Assessment & Plan Note (Addendum)
Recent A1cs have been trending up. She has now resumed her lifestyle efforts, which I encouraged her to continue. Work on reducing abdominal obesity.  Discussed about medication management between titrating up her Metformin versus alternative second line therapy. She has elected to try an increased dose of metformin. Increase to 500 mg morning and 1000 mg at night time.   Regular self foot care, dietary modifications discussed.  She will start checking her sugars at various time points in the day.   RTC 3 months

## 2014-03-19 NOTE — Patient Instructions (Signed)
Keep working on reducing portion sizes and carb controlling your food.  Good job at starting back on exercise.  Aim for another 5-10 lbs weight loss over the next few months.  Check at various times of the day.   Increase metformin to 500 mg in the morning, 1000 mg at night.    RTC 3 months with meter.

## 2014-03-20 NOTE — Telephone Encounter (Signed)
The patient called and stated she does not want a referral to GI.

## 2014-03-26 ENCOUNTER — Telehealth: Payer: Self-pay | Admitting: *Deleted

## 2014-03-26 NOTE — Telephone Encounter (Signed)
Notified pt. 

## 2014-03-26 NOTE — Telephone Encounter (Signed)
Pt left message stating that she would like a Rx for her "free diabetic shoes" and that she would come by to pick this up today.  Will she need an order form from the company that she wants to order from?

## 2014-03-26 NOTE — Telephone Encounter (Signed)
She has no neuropathy or loss of sensation in her feet. No history of prior amputation/ulcer. I don't think she meets the criteria for DM shoes for govt plans. If she has Nurse, learning disabilitycommercial insurance, then she should check with insurance, regd the criteria for her to DM shoes.

## 2014-04-01 ENCOUNTER — Other Ambulatory Visit: Payer: Self-pay | Admitting: Internal Medicine

## 2014-04-23 ENCOUNTER — Encounter: Payer: Self-pay | Admitting: Internal Medicine

## 2014-05-02 ENCOUNTER — Telehealth: Payer: Self-pay | Admitting: Internal Medicine

## 2014-05-02 ENCOUNTER — Telehealth: Payer: Self-pay | Admitting: *Deleted

## 2014-05-02 NOTE — Telephone Encounter (Signed)
Left detailed message on pts VM advising no referral needed.

## 2014-05-02 NOTE — Telephone Encounter (Signed)
We can submit referral for M54.5, low back pain.

## 2014-05-02 NOTE — Telephone Encounter (Signed)
The patient is wanting a referral to a chiropractic specialist Dr. Patrici RanksBeshel for her back does she need to be seen prior to having a referral. She has had x-rays done for this recently per the patient.

## 2014-05-02 NOTE — Telephone Encounter (Signed)
Pt called again states as per her insurance [employer] she does need a referral from her PCP for Dr Devoria AlbeBeshell, Chiropractor.  Please advise

## 2014-05-02 NOTE — Telephone Encounter (Signed)
I don't generally refer to chiropractors. She should not need a referral for this.

## 2014-05-05 ENCOUNTER — Telehealth: Payer: Self-pay | Admitting: Internal Medicine

## 2014-05-05 DIAGNOSIS — M545 Low back pain, unspecified: Secondary | ICD-10-CM

## 2014-05-05 NOTE — Telephone Encounter (Signed)
Notified pt. 

## 2014-05-05 NOTE — Telephone Encounter (Signed)
[employer] referral has been submitted for Dr. Patrici RanksBeshel.

## 2014-05-05 NOTE — Telephone Encounter (Signed)
Please let pt know that we have placed a referral for chiropractic care, however [employer] can take 7-10 days to approve this.

## 2014-06-18 LAB — POCT GLUCOSE (DEVICE FOR HOME USE)

## 2014-06-19 ENCOUNTER — Ambulatory Visit (INDEPENDENT_AMBULATORY_CARE_PROVIDER_SITE_OTHER): Payer: Commercial Managed Care - HMO | Admitting: Endocrinology

## 2014-06-19 ENCOUNTER — Encounter: Payer: Self-pay | Admitting: Endocrinology

## 2014-06-19 VITALS — BP 136/78 | HR 108 | Ht 63.75 in | Wt 238.2 lb

## 2014-06-19 DIAGNOSIS — I10 Essential (primary) hypertension: Secondary | ICD-10-CM

## 2014-06-19 DIAGNOSIS — E785 Hyperlipidemia, unspecified: Secondary | ICD-10-CM

## 2014-06-19 DIAGNOSIS — E119 Type 2 diabetes mellitus without complications: Secondary | ICD-10-CM

## 2014-06-19 LAB — HEPATIC FUNCTION PANEL
ALBUMIN: 4.4 g/dL (ref 3.5–5.2)
ALT: 43 U/L — ABNORMAL HIGH (ref 0–35)
AST: 31 U/L (ref 0–37)
Alkaline Phosphatase: 64 U/L (ref 39–117)
Bilirubin, Direct: 0.1 mg/dL (ref 0.0–0.3)
Total Bilirubin: 0.7 mg/dL (ref 0.2–1.2)
Total Protein: 7.3 g/dL (ref 6.0–8.3)

## 2014-06-19 LAB — HEMOGLOBIN A1C: Hgb A1c MFr Bld: 7.5 % — ABNORMAL HIGH (ref 4.6–6.5)

## 2014-06-19 NOTE — Progress Notes (Signed)
Pre visit review using our clinic review tool, if applicable. No additional management support is needed unless otherwise documented below in the visit note. 

## 2014-06-19 NOTE — Progress Notes (Signed)
Reason for visit-  Gabriela Reed is a 74 y.o.-year-old female, here for follow up management of Type 2 diabetes, uncontrolled, without complications. Associated CAD.   HPI- Patient has been diagnosed with diabetes in 2009. Recalls being initially on lifestyle modifications.   she has not been on insulin before. Reports being well controlled since her diagnosis, and had been seeing endo in [place]. [place], and hasn't been able to keep up with her lifestyle efforts since then.  Now, after recent appt with PCP in 2015 she has gotten back to her diet and exercise .    Pt is currently on a regimen of: - Metformin XR 500 mg po am and 1000 mg at night ( tolerating well-increased at last visit)    Last hemoglobin A1c was: Lab Results  Component Value Date   HGBA1C 8.1* 03/05/2014   HGBA1C 7.5* 08/28/2013   HGBA1C 7.2* 03/14/2013     Pt checks her sugars once every other day now . Uses One Touch mini glucometer. By meter download they are:   PREMEAL Breakfast Lunch Dinner Bedtime Overall  Glucose range: 103-128 116-121  114   Mean/median:         Hypoglycemia-  No lows recently.   Dietary habits- eats three times daily. Tries to limit carbs, sweetened beverages, sodas, desserts. Now working on portion sizes, now much better. Went to several DM education  Classes in [place], and knows what to do.  Exercise- [facility] , [facility] daily , exercises at gym  4 times weekly , now going to start weight training-in the past 3 months -has done much less due to sciatica recently Weight - down Wt Readings from Last 3 Encounters:  06/19/14 238 lb 4 oz (108.069 kg)  03/19/14 241 lb 12 oz (109.657 kg)  03/05/14 247 lb 8 oz (112.265 kg)    Diabetes Complications-  Nephropathy- No  CKD, last BUN/creatinine-   Lab Results  Component Value Date   BUN 19 03/05/2014   CREATININE 0.7 03/05/2014   Lab Results  Component Value Date   GFR 92.17  03/05/2014   Lab Results  Component Value Date   MICRALBCREAT 4.0 03/05/2014    Retinopathy- No, Last DEE was jan2016. Goes back for field test. No DR per report.  Neuropathy- no numbness and tingling in her feet. No known neuropathy.  Associated history - Has CAD  S/p stent 2004. No prior stroke. No hypothyroidism. her last TSH was trending up. No hypothyroid symptoms.  Lab Results  Component Value Date   TSH 4.33 03/05/2014    Hyperlipidemia-  her last set of lipids were- Currently on Lipitor 20 mg. Tolerating well.  Of note, has abnormal liver tests with recent workup suggesting likely fatty liver.  Lab Results  Component Value Date   CHOL 150 03/05/2014   HDL 42.40 03/05/2014   LDLCALC 87 03/05/2014   TRIG 103.0 03/05/2014   CHOLHDL 4 03/05/2014   Lab Results  Component Value Date   AST 32 03/05/2014   ALT 51* 03/05/2014      Blood Pressure/HTN- Patient's blood pressure is  Near well controlled today on current regimen. She is allergic to lisinopril due to cough.    I have reviewed the patient's past medical history,  medications and allergies.   Current Outpatient Prescriptions on File Prior to Visit  Medication Sig Dispense Refill  . aspirin 81 MG tablet Take 81 mg by mouth daily.    .Marland Kitchen  atorvastatin (LIPITOR) 20 MG tablet Take 1 tablet (20 mg total) by mouth daily. 90 tablet 3  . desonide (DESOWEN) 0.05 % cream Apply 1 application topically as needed. 60 g 3  . hydrochlorothiazide (HYDRODIURIL) 25 MG tablet Take 1 tablet (25 mg total) by mouth daily. 90 tablet 3  . metFORMIN (GLUCOPHAGE-XR) 500 MG 24 hr tablet Take 500 mg in the morning and 1000 mg at night time. 270 tablet 3  . metoprolol succinate (TOPROL-XL) 50 MG 24 hr tablet Take 1 tablet (50 mg total) by mouth daily. Take with or immediately following a meal. 90 tablet 3  . ONE TOUCH ULTRA TEST test strip USE ONE STRIP TO CHECK GLUCOSE EVERY OTHER DAY 100 each 3  . Travoprost, BAK Free, (TRAVATAN) 0.004 % SOLN  ophthalmic solution Place 1 drop into the left eye at bedtime.     No current facility-administered medications on file prior to visit.   Allergies  Allergen Reactions  . Niaspan [Niacin Er] Rash  . Lisinopril     Review of Systems- [ x ]  Complains of    [  ]  denies [  ] Recent weight change [  ]  Fatigue [  ] polydipsia [  ] polyuria [  ]  nocturia [  ]  vision difficulty [  ] chest pain [  ] shortness of breath [  ] leg swelling [  ] cough [  ] nausea/vomiting [  ] diarrhea [  ] constipation [  ] abdominal pain [  ]  tingling/numbness in extremities [  ]  concern with feet ( wounds/sores)   PE: BP 136/78 mmHg  Pulse 108  Ht 5' 3.75" (1.619 m)  Wt 238 lb 4 oz (108.069 kg)  BMI 41.23 kg/m2  SpO2 96% Wt Readings from Last 3 Encounters:  06/19/14 238 lb 4 oz (108.069 kg)  03/19/14 241 lb 12 oz (109.657 kg)  03/05/14 247 lb 8 oz (112.265 kg)  Body mass index is 41.23 kg/(m^2).  GENERAL: No acute distress, well developed HEENT:  Eye exam shows normal external appearance.  NECK:   Neck exam shows no lymphadenopathy.Thyroid is not enlarged and no nodules felt.  LUNGS:         Chest is symmetrical. Lungs are clear to auscultation.Marland Kitchen   HEART:         Heart sounds:  S1 and S2 are normal. Systolic murmur , P recheck at 84     ASSESSMENT AND PLAN: 1. Type 2 diabetes mellitus 2. Hyperlipidemia 3. Hypertension 4. Morbid obesity  Problem List Items Addressed This Visit      Cardiovascular and Mediastinum   Hypertension    BP near goal today. Intolerant to ACE-I. Last urine MA negative 10/15.         Relevant Orders   Hepatic function panel     Endocrine   Diabetes mellitus type 2, controlled - Primary    Last A1c was up. Now sugars are much better. Update A1c today.  She has now resumed her lifestyle efforts, which I encouraged her to continue. Work on reducing abdominal obesity.  Continue current metformin for now. Will also update liver tests today.   Encouraged her  to check sugars once daily at various times of the day. RTC 3 months        Relevant Orders   Hemoglobin A1c   Hepatic function panel     Other   Hyperlipidemia with target LDL less than 70  Recent LDL <100. Continue statin. Mild LFT abnormality due to likely fatty liver. Weight loss encouraged.         Relevant Orders   Hepatic function panel   Morbid obesity    Recently has resumed her dietary and exercise efforts. Continues to have weight loss.  Congratulated her on effort.  She will continue to work on this. Discussed need to get BMI down to improve DM control.         Relevant Orders   Hepatic function panel       - Return to clinic in 3 mo with sugar log/meter.  [facility] 06/19/2014 8:33 AM

## 2014-06-19 NOTE — Assessment & Plan Note (Signed)
Last A1c was up. Now sugars are much better. Update A1c today.  She has now resumed her lifestyle efforts, which I encouraged her to continue. Work on reducing abdominal obesity.  Continue current metformin for now. Will also update liver tests today.   Encouraged her to check sugars once daily at various times of the day. RTC 3 months

## 2014-06-19 NOTE — Assessment & Plan Note (Signed)
BP near goal today. Intolerant to ACE-I. Last urine MA negative 10/15.

## 2014-06-19 NOTE — Patient Instructions (Signed)
Continue current metformin for sugar control.  Try checking your sugars once daily at various times of the day. Start exercise program and continue watching your diet.  Good job with the current weight loss.   Please come back for a follow-up appointment in 3 months

## 2014-06-19 NOTE — Assessment & Plan Note (Signed)
Recent LDL <100. Continue statin. Mild LFT abnormality due to likely fatty liver. Weight loss encouraged.  

## 2014-06-19 NOTE — Assessment & Plan Note (Signed)
Recently has resumed her dietary and exercise efforts. Continues to have weight loss.  Congratulated her on effort.  She will continue to work on this. Discussed need to get BMI down to improve DM control.

## 2014-07-04 ENCOUNTER — Encounter: Payer: Self-pay | Admitting: Endocrinology

## 2014-08-28 ENCOUNTER — Other Ambulatory Visit: Payer: Self-pay | Admitting: Internal Medicine

## 2014-09-11 ENCOUNTER — Ambulatory Visit (INDEPENDENT_AMBULATORY_CARE_PROVIDER_SITE_OTHER): Payer: Commercial Managed Care - HMO | Admitting: Internal Medicine

## 2014-09-11 ENCOUNTER — Encounter: Payer: Self-pay | Admitting: Internal Medicine

## 2014-09-11 VITALS — BP 125/73 | HR 73 | Temp 97.8°F | Ht 63.5 in | Wt 238.5 lb

## 2014-09-11 DIAGNOSIS — I10 Essential (primary) hypertension: Secondary | ICD-10-CM

## 2014-09-11 DIAGNOSIS — E785 Hyperlipidemia, unspecified: Secondary | ICD-10-CM | POA: Diagnosis not present

## 2014-09-11 DIAGNOSIS — R002 Palpitations: Secondary | ICD-10-CM | POA: Diagnosis not present

## 2014-09-11 DIAGNOSIS — E119 Type 2 diabetes mellitus without complications: Secondary | ICD-10-CM | POA: Diagnosis not present

## 2014-09-11 DIAGNOSIS — I2510 Atherosclerotic heart disease of native coronary artery without angina pectoris: Secondary | ICD-10-CM

## 2014-09-11 LAB — CBC WITH DIFFERENTIAL/PLATELET
BASOS PCT: 0.4 % (ref 0.0–3.0)
Basophils Absolute: 0 10*3/uL (ref 0.0–0.1)
Eosinophils Absolute: 0.1 10*3/uL (ref 0.0–0.7)
Eosinophils Relative: 2.8 % (ref 0.0–5.0)
HEMATOCRIT: 43.8 % (ref 36.0–46.0)
Hemoglobin: 14.8 g/dL (ref 12.0–15.0)
LYMPHS ABS: 2 10*3/uL (ref 0.7–4.0)
Lymphocytes Relative: 39 % (ref 12.0–46.0)
MCHC: 33.7 g/dL (ref 30.0–36.0)
MCV: 86.6 fl (ref 78.0–100.0)
Monocytes Absolute: 0.4 10*3/uL (ref 0.1–1.0)
Monocytes Relative: 7.7 % (ref 3.0–12.0)
NEUTROS PCT: 50.1 % (ref 43.0–77.0)
Neutro Abs: 2.6 10*3/uL (ref 1.4–7.7)
Platelets: 178 10*3/uL (ref 150.0–400.0)
RBC: 5.05 Mil/uL (ref 3.87–5.11)
RDW: 13.2 % (ref 11.5–15.5)
WBC: 5.2 10*3/uL (ref 4.0–10.5)

## 2014-09-11 LAB — COMPREHENSIVE METABOLIC PANEL
ALBUMIN: 4.5 g/dL (ref 3.5–5.2)
ALT: 43 U/L — AB (ref 0–35)
AST: 34 U/L (ref 0–37)
Alkaline Phosphatase: 64 U/L (ref 39–117)
BUN: 18 mg/dL (ref 6–23)
CHLORIDE: 101 meq/L (ref 96–112)
CO2: 30 meq/L (ref 19–32)
Calcium: 10.1 mg/dL (ref 8.4–10.5)
Creatinine, Ser: 0.82 mg/dL (ref 0.40–1.20)
GFR: 72.9 mL/min (ref >60.00)
Glucose, Bld: 148 mg/dL — ABNORMAL HIGH (ref 70–99)
Potassium: 4.1 mEq/L (ref 3.5–5.1)
Sodium: 138 mEq/L (ref 135–145)
Total Bilirubin: 0.7 mg/dL (ref 0.2–1.2)
Total Protein: 7.7 g/dL (ref 6.0–8.3)

## 2014-09-11 LAB — LIPID PANEL
Cholesterol: 142 mg/dL (ref 0–200)
HDL: 44.4 mg/dL (ref >39.00)
LDL Cholesterol: 73 mg/dL (ref 0–99)
NonHDL: 97.6
Total CHOL/HDL Ratio: 3
Triglycerides: 122 mg/dL (ref 0.0–149.0)
VLDL: 24.4 mg/dL (ref 0.0–40.0)

## 2014-09-11 LAB — MICROALBUMIN / CREATININE URINE RATIO
Creatinine,U: 119.5 mg/dL
MICROALB/CREAT RATIO: 4.3 mg/g (ref 0.0–30.0)
Microalb, Ur: 5.1 mg/dL — ABNORMAL HIGH (ref 0.0–1.9)

## 2014-09-11 LAB — HEMOGLOBIN A1C: Hgb A1c MFr Bld: 7.4 % — ABNORMAL HIGH (ref 4.6–6.5)

## 2014-09-11 LAB — TSH: TSH: 4.58 u[IU]/mL — AB (ref 0.35–4.50)

## 2014-09-11 MED ORDER — ATORVASTATIN CALCIUM 20 MG PO TABS: 20.0000 mg | ORAL_TABLET | Freq: Every day | ORAL | Status: DC

## 2014-09-11 MED ORDER — BLOOD GLUCOSE MONITOR KIT: 1.0000 | PACK | Status: DC

## 2014-09-11 MED ORDER — METFORMIN HCL ER 500 MG PO TB24: ORAL_TABLET | ORAL | Status: DC

## 2014-09-11 MED ORDER — LANCETS MISC: Status: DC

## 2014-09-11 MED ORDER — DESONIDE 0.05 % EX CREA: 1.0000 "application " | TOPICAL_CREAM | CUTANEOUS | Status: DC | PRN

## 2014-09-11 NOTE — Assessment & Plan Note (Signed)
Recent episodes of palpitations with chest pain. EKG today. Will set up evaluation with cardiology and request her records from [place] from previous stent placement.

## 2014-09-11 NOTE — Progress Notes (Signed)
Subjective:    Patient ID: Gabriela MessJudith O Reed, female    DOB: 03/15/1941, 74 y.o.   MRN: [id number]  HPI  73YO female presents for follow up.  DM - Blood sugars running near low 100s in the morning. Notes some increase in BG with carbs such as tomatoes. Highest 173. No low BG. No diarrhea or nausea on the metformin.  Episode on Easter of heart racing and feeling dizzy. Some chest pain during this episode. Described as pressure or tightness in mid and left chest. This has recurred a couple of times since. Occurs at rest.  No diaphoresis or nausea. Lasted only a few minutes. Resolves without intervention. BP was normal after episode. Typically occurs sporadically.  CAD - stent placed in 2004. She is unsure which artery.  Wt Readings from Last 3 Encounters:  09/11/14 238 lb 8 oz (108.183 kg)  06/19/14 238 lb 4 oz (108.069 kg)  03/19/14 241 lb 12 oz (109.657 kg)     Past medical, surgical, family and social history per today's encounter.  Review of Systems  Constitutional: Negative for fever, chills, appetite change, fatigue and unexpected weight change.  Eyes: Negative for visual disturbance.  Respiratory: Negative for shortness of breath.   Cardiovascular: Positive for chest pain and palpitations. Negative for leg swelling.  Gastrointestinal: Negative for nausea, vomiting, abdominal pain, diarrhea and constipation.  Skin: Negative for color change and rash.  Hematological: Negative for adenopathy. Does not bruise/bleed easily.  Psychiatric/Behavioral: Negative for dysphoric mood. The patient is not nervous/anxious.        Objective:    BP 125/73 mmHg  Pulse 73  Temp(Src) 97.8 F (36.6 C) (Oral)  Ht 5' 3.5" (1.613 m)  Wt 238 lb 8 oz (108.183 kg)  BMI 41.58 kg/m2  SpO2 99% Physical Exam  Constitutional: She is oriented to person, place, and time. She appears well-developed and well-nourished. No distress.  HENT:  Head: Normocephalic and atraumatic.  Right Ear: External ear  normal.  Left Ear: External ear normal.  Nose: Nose normal.  Mouth/Throat: Oropharynx is clear and moist. No oropharyngeal exudate.  Eyes: Conjunctivae are normal. Pupils are equal, round, and reactive to light. Right eye exhibits no discharge. Left eye exhibits no discharge. No scleral icterus.  Neck: Normal range of motion. Neck supple. No tracheal deviation present. No thyromegaly present.  Cardiovascular: Normal rate, regular rhythm, normal heart sounds and intact distal pulses.  Exam reveals no gallop and no friction rub.   No murmur heard. Pulmonary/Chest: Effort normal and breath sounds normal. No respiratory distress. She has no wheezes. She has no rales. She exhibits no tenderness.  Musculoskeletal: Normal range of motion. She exhibits no edema or tenderness.  Lymphadenopathy:    She has no cervical adenopathy.  Neurological: She is alert and oriented to person, place, and time. No cranial nerve deficit. She exhibits normal muscle tone. Coordination normal.  Skin: Skin is warm and dry. No rash noted. She is not diaphoretic. No erythema. No pallor.  Psychiatric: She has a normal mood and affect. Her behavior is normal. Judgment and thought content normal.          Assessment & Plan:   Problem List Items Addressed This Visit      Unprioritized   Coronary artery disease    Recent episodes of palpitations and chest pain concerning for CAD. Will request records on previous cath and stent placement. Will set up evaluation with cardiology for possible Holter and stress test.  Relevant Medications   atorvastatin (LIPITOR) 20 MG tablet   Diabetes mellitus type 2, controlled    Check A1c with labs today. Continue Metformin.      Relevant Medications   metFORMIN (GLUCOPHAGE-XR) 500 MG 24 hr tablet   atorvastatin (LIPITOR) 20 MG tablet   Other Relevant Orders   Comprehensive metabolic panel   Hemoglobin A1c   Hyperlipidemia with target LDL less than 70   Relevant  Medications   atorvastatin (LIPITOR) 20 MG tablet   Other Relevant Orders   Lipid panel   Hypertension    BP Readings from Last 3 Encounters:  09/11/14 125/73  06/19/14 136/78  03/19/14 132/84   BP well controlled. Will check renal function with labs. Continue current medications.      Relevant Medications   atorvastatin (LIPITOR) 20 MG tablet   Other Relevant Orders   Microalbumin / creatinine urine ratio   Palpitations - Primary    Recent episodes of palpitations with chest pain. EKG today. Will set up evaluation with cardiology and request her records from [place] from previous stent placement.      Relevant Orders   EKG 12-Lead (Completed)   TSH   CBC with Differential/Platelet   Ambulatory referral to Cardiology       Return in about 4 weeks (around 10/09/2014) for Recheck.

## 2014-09-11 NOTE — Assessment & Plan Note (Signed)
Check A1c with labs today. Continue Metformin. 

## 2014-09-11 NOTE — Patient Instructions (Signed)
We will set up an evaluation with cardiology.  Follow up in 4 weeks.

## 2014-09-11 NOTE — Assessment & Plan Note (Signed)
BP Readings from Last 3 Encounters:  09/11/14 125/73  06/19/14 136/78  03/19/14 132/84   BP well controlled. Will check renal function with labs. Continue current medications.

## 2014-09-11 NOTE — Assessment & Plan Note (Signed)
Recent episodes of palpitations and chest pain concerning for CAD. Will request records on previous cath and stent placement. Will set up evaluation with cardiology for possible Holter and stress test.

## 2014-09-18 ENCOUNTER — Ambulatory Visit: Payer: Commercial Managed Care - HMO | Admitting: Endocrinology

## 2014-09-18 ENCOUNTER — Ambulatory Visit (INDEPENDENT_AMBULATORY_CARE_PROVIDER_SITE_OTHER): Payer: Commercial Managed Care - HMO | Admitting: Cardiovascular Disease

## 2014-09-18 ENCOUNTER — Encounter: Payer: Self-pay | Admitting: Cardiovascular Disease

## 2014-09-18 VITALS — BP 190/88 | HR 78 | Ht 64.0 in | Wt 240.0 lb

## 2014-09-18 DIAGNOSIS — I2510 Atherosclerotic heart disease of native coronary artery without angina pectoris: Secondary | ICD-10-CM

## 2014-09-18 DIAGNOSIS — R002 Palpitations: Secondary | ICD-10-CM

## 2014-09-18 NOTE — Progress Notes (Signed)
Cardiology Office Note   Date:  09/18/2014   ID:  Gabriela Reed, DOB 1940-07-17, MRN [id number]  PCP:  Rica Mast, MD  Cardiologist:   Thayer Headings, MD   Chief Complaint  Patient presents with  . Coronary Artery Disease    Per PCP c/o rapid heart rate. Pt would like EKG only as necessary. Meds reviewed verbally with pt.     Problem list 1. Coronary artery disease 2. Hyperlipidemia 3. Hypertension 4. Diabetes mellitus 5. tachypalpitations    History of Present Illness: Gabriela Reed is a 74 y.o. female who presents for evaluation of palpitations.  She describes 2 episodes of fast HR. Was not able to tell how fast her HR was. Lasted 5 minutes.  Felt a bit dizzy. No CP , no dyspnea.   At other times, she may have some tightness in her chest .  Also has a slight headach. Not associated with any specific activity  ( exercise, eating, drinking) . She thinks the HR may be irregular.  Since her stent placement in 2004, she has not had any further CP .   Past Medical History  Diagnosis Date  . Chicken pox   . Diabetes mellitus   . Glaucoma   . Heart disease   . Hyperlipidemia   . Hypertension   . Heart murmur   . Coronary artery disease     Past Surgical History  Procedure Laterality Date  . Tonsillectomy and adenoidectomy  1949  . Coronary artery stent  2004  . Cardiac catheterization      [place] x1 stent     Current Outpatient Prescriptions  Medication Sig Dispense Refill  . aspirin 81 MG tablet Take 81 mg by mouth daily.    Marland Kitchen atorvastatin (LIPITOR) 20 MG tablet Take 1 tablet (20 mg total) by mouth daily. 90 tablet 3  . blood glucose meter kit and supplies KIT 1 each by Does not apply route as directed. Accu-Check Nano meter. Use daily as directed. (FOR ICD-10 E11.9). 1 each 0  . desonide (DESOWEN) 0.05 % cream Apply 1 application topically as needed. 60 g 3  . glucose blood test strip 1 each by Other route as needed for other. Use as  instructed, to test blood sugar every other day.    . hydrochlorothiazide (HYDRODIURIL) 25 MG tablet TAKE ONE TABLET BY MOUTH ONCE DAILY 90 tablet 0  . Lancets MISC Use as directed 100 each 1  . metFORMIN (GLUCOPHAGE-XR) 500 MG 24 hr tablet Take 500 mg in the morning and 1000 mg at night time. 270 tablet 3  . metoprolol succinate (TOPROL-XL) 50 MG 24 hr tablet Take 1 tablet (50 mg total) by mouth daily. Take with or immediately following a meal. 90 tablet 3  . Travoprost, BAK Free, (TRAVATAN) 0.004 % SOLN ophthalmic solution Place 1 drop into the left eye at bedtime.     No current facility-administered medications for this visit.    Allergies:   Niaspan and Lisinopril    Social History:  The patient  reports that she has quit smoking. She has never used smokeless tobacco. She reports that she drinks alcohol. She reports that she does not use illicit drugs.   Family History:  The patient's family history includes Heart disease (age of onset: 67) in her father; Hyperlipidemia in her father and mother; Hypertension in her father and mother.    ROS:  Please see the history of present illness.    Review of Systems:  Constitutional:  denies fever, chills, diaphoresis, appetite change and fatigue.  HEENT: denies photophobia, eye pain, redness, hearing loss, ear pain, congestion, sore throat, rhinorrhea, sneezing, neck pain, neck stiffness and tinnitus.  Respiratory: denies SOB, DOE, cough, chest tightness, and wheezing.  Cardiovascular: admits to   palpitations   Gastrointestinal: denies nausea, vomiting, abdominal pain, diarrhea, constipation, blood in stool.  Genitourinary: denies dysuria, urgency, frequency, hematuria, flank pain and difficulty urinating.  Musculoskeletal: denies  myalgias, back pain, joint swelling, arthralgias and gait problem.   Skin: denies pallor, rash and wound.  Neurological: denies dizziness, seizures, syncope, weakness, light-headedness, numbness and headaches.     Hematological: denies adenopathy, easy bruising, personal or family bleeding history.  Psychiatric/ Behavioral: denies suicidal ideation, mood changes, confusion, nervousness, sleep disturbance and agitation.       All other systems are reviewed and negative.    PHYSICAL EXAM: VS:  BP 190/88 mmHg  Pulse 78  Ht _0  (1.626 m)  Wt 240 lb (108.863 kg)  BMI 41.18 kg/m2 , BMI Body mass index is 41.18 kg/(m^2). GEN: Well nourished, well developed, in no acute distress HEENT: normal Neck: no JVD, carotid bruits, or masses Cardiac: RRR; no murmurs, rubs, or gallops,no edema  Respiratory:  clear to auscultation bilaterally, normal work of breathing GI: soft, nontender, nondistended, + BS MS: no deformity or atrophy Skin: warm and dry, no rash Neuro:  Strength and sensation are intact Psych: normal   EKG:  EKG is ordered today. The ekg ordered 09/11/14  demonstrates NSR at 70.  There are no ST or T wave changes.    Recent Labs: 09/11/2014: ALT 43*; BUN 18; Creatinine 0.82; Hemoglobin 14.8; Platelets 178.0; Potassium 4.1; Sodium 138; TSH 4.58*    Lipid Panel    Component Value Date/Time   CHOL 142 09/11/2014 0848   TRIG 122.0 09/11/2014 0848   HDL 44.40 09/11/2014 0848   CHOLHDL 3 09/11/2014 0848   VLDL 24.4 09/11/2014 0848   LDLCALC 73 09/11/2014 0848      Wt Readings from Last 3 Encounters:  09/18/14 240 lb (108.863 kg)  09/11/14 238 lb 8 oz (108.183 kg)  06/19/14 238 lb 4 oz (108.069 kg)      Other studies Reviewed: Additional studies/ records that were reviewed today include: . Review of the above records demonstrates:    ASSESSMENT AND PLAN:  1.  Tachypalpitations:  Possibilities include atrial fib vs. SVT. ( [place] = 4, female, HTN, DM, age > 33)   I'v e instructed her in the valsalva maneuver and also stimulation of the diving reflex. Will place a 30 day monitor for further evaluation of these palpitations. Will also get an echo since she has a hx of  CAD.  Will be useful to evaluate her chamber size and function.   2. Coronary artery disease. We have a cath report from 2006. At that time she had moderate coronary artery disease in her distal LAD. The mid LAD had a patent drug-eluting stent. Left ventricular systolic function was normal with an EF of 60%. She's not having any episodes of angina. We'll continue with her current medications.  I will see her back in several months .     Current medicines are reviewed at length with the patient today.  The patient does not have concerns regarding medicines.  The following changes have been made:  no change  Labs/ tests ordered today include:  No orders of the defined types were placed in this encounter.  Disposition:   FU with me in 1-2 months .     Signed, Nahser, Wonda Cheng, MD  09/18/2014 8:59 AM    [facility] [place], [place], [place]  [address] Phone: [phone]; Fax: [phone]

## 2014-09-18 NOTE — Patient Instructions (Signed)
Medication Instructions: - none  Labwork: - none  Procedures/Testing: - Your physician has requested that you have an echocardiogram. Echocardiography is a painless test that uses sound waves to create images of your heart. It provides your doctor with information about the size and shape of your heart and how well your heart's chambers and valves are working. This procedure takes approximately one hour. There are no restrictions for this procedure.  - Your physician has recommended that you wear an 30 event monitor. Event monitors are medical devices that record the heart's electrical activity. Doctors most often us these monitors to diagnose arrhythmias. Arrhythmias are problems with the speed or rhythm of the heartbeat. The monitor is a small, portable device. You can wear one while you do your normal daily activities. This is usually used to diagnose what is causing palpitations/syncope (passing out).  Follow-Up: - Your physician recommends that you schedule a follow-up appointment in: 1- 2 months with Dr. Elease HashimotoNahser.   Any Additional Special Instructions Will Be Listed Below (If Applicable).

## 2014-09-21 DIAGNOSIS — R002 Palpitations: Secondary | ICD-10-CM | POA: Diagnosis not present

## 2014-09-23 ENCOUNTER — Other Ambulatory Visit: Payer: Self-pay | Admitting: Internal Medicine

## 2014-09-29 ENCOUNTER — Other Ambulatory Visit: Payer: Self-pay | Admitting: Cardiovascular Disease

## 2014-09-29 DIAGNOSIS — I2510 Atherosclerotic heart disease of native coronary artery without angina pectoris: Secondary | ICD-10-CM

## 2014-10-06 ENCOUNTER — Other Ambulatory Visit: Payer: Self-pay

## 2014-10-06 ENCOUNTER — Ambulatory Visit (INDEPENDENT_AMBULATORY_CARE_PROVIDER_SITE_OTHER): Payer: Commercial Managed Care - HMO

## 2014-10-06 DIAGNOSIS — I2510 Atherosclerotic heart disease of native coronary artery without angina pectoris: Secondary | ICD-10-CM

## 2014-10-08 ENCOUNTER — Telehealth: Payer: Self-pay | Admitting: Cardiovascular Disease

## 2014-10-08 NOTE — Telephone Encounter (Signed)
Reviewed results of echocardiogram with patient who verbalized understanding

## 2014-10-08 NOTE — Telephone Encounter (Signed)
New problem   Pt returning your call concerning her echo results.

## 2014-10-15 ENCOUNTER — Ambulatory Visit: Payer: Commercial Managed Care - HMO | Admitting: Internal Medicine

## 2014-11-27 ENCOUNTER — Encounter: Payer: Self-pay | Admitting: Cardiovascular Disease

## 2014-11-27 ENCOUNTER — Ambulatory Visit (INDEPENDENT_AMBULATORY_CARE_PROVIDER_SITE_OTHER): Payer: Commercial Managed Care - HMO | Admitting: Cardiovascular Disease

## 2014-11-27 VITALS — BP 158/80 | HR 93 | Ht 64.0 in | Wt 238.2 lb

## 2014-11-27 DIAGNOSIS — I2510 Atherosclerotic heart disease of native coronary artery without angina pectoris: Secondary | ICD-10-CM | POA: Diagnosis not present

## 2014-11-27 DIAGNOSIS — I480 Paroxysmal atrial fibrillation: Secondary | ICD-10-CM

## 2014-11-27 MED ORDER — METOPROLOL SUCCINATE ER 100 MG PO TB24: 100.0000 mg | ORAL_TABLET | Freq: Every day | ORAL | Status: DC

## 2014-11-27 NOTE — Progress Notes (Signed)
Cardiology Office Note   Date:  11/27/2014   ID:  Gabriela Reed, DOB 21-Apr-1941, MRN [id number]  PCP:  Rica Mast, MD  Cardiologist:   Thayer Headings, MD   Chief Complaint  Patient presents with  . other    Follow up from Echo and 30 day event monitor. Meds reviewed by the patient verbally. Pt. c/o a cough x 2 weeks.      Problem list 1. Coronary artery disease - s/p stenting  2. Hyperlipidemia 3. Hypertension 4. Diabetes mellitus 5. Paroxysmal atrial fib:      History of Present Illness: Gabriela Reed is a 74 y.o. female who presents for evaluation of palpitations.  She describes 2 episodes of fast HR. Was not able to tell how fast her HR was. Lasted 5 minutes.  Felt a bit dizzy. No CP , no dyspnea.   At other times, she may have some tightness in her chest .  Also has a slight headach. Not associated with any specific activity  ( exercise, eating, drinking) . She thinks the HR may be irregular.  Since her stent placement in 2004, she has not had any further CP .   November 27, 2014: Kushi wore an event monitor and was found to have atrial fibrillation . She has a cold today     Past Medical History  Diagnosis Date  . Chicken pox   . Diabetes mellitus   . Glaucoma   . Heart disease   . Hyperlipidemia   . Hypertension   . Heart murmur   . Coronary artery disease     Past Surgical History  Procedure Laterality Date  . Tonsillectomy and adenoidectomy  1949  . Coronary artery stent  2004  . Cardiac catheterization      [place] x1 stent     Current Outpatient Prescriptions  Medication Sig Dispense Refill  . aspirin 81 MG tablet Take 81 mg by mouth daily.    Marland Kitchen atorvastatin (LIPITOR) 20 MG tablet Take 1 tablet (20 mg total) by mouth daily. 90 tablet 3  . blood glucose meter kit and supplies KIT 1 each by Does not apply route as directed. Accu-Check Nano meter. Use daily as directed. (FOR ICD-10 E11.9). 1 each 0  . desonide (DESOWEN) 0.05 %  cream Apply 1 application topically as needed. 60 g 3  . glucose blood test strip 1 each by Other route as needed for other. Use as instructed, to test blood sugar every other day.    . hydrochlorothiazide (HYDRODIURIL) 25 MG tablet TAKE ONE TABLET BY MOUTH ONCE DAILY 90 tablet 0  . Lancets MISC Use as directed 100 each 1  . metFORMIN (GLUCOPHAGE-XR) 500 MG 24 hr tablet Take 500 mg in the morning and 1000 mg at night time. 270 tablet 3  . metoprolol succinate (TOPROL-XL) 50 MG 24 hr tablet TAKE ONE TABLET BY MOUTH ONCE DAILY. TAKE WITH OR IMMEDIATELY FOLLOWING A MEAL 90 tablet 1  . Travoprost, BAK Free, (TRAVATAN) 0.004 % SOLN ophthalmic solution Place 1 drop into the left eye at bedtime.     No current facility-administered medications for this visit.    Allergies:   Niaspan and Lisinopril    Social History:  The patient  reports that she has quit smoking. She has never used smokeless tobacco. She reports that she drinks alcohol. She reports that she does not use illicit drugs.   Family History:  The patient's family history includes Heart disease (age of onset: 67) in  her father; Hyperlipidemia in her father and mother; Hypertension in her father and mother.    ROS:  Please see the history of present illness.    Review of Systems: Constitutional:  denies fever, chills, diaphoresis, appetite change and fatigue.  HEENT: denies photophobia, eye pain, redness, hearing loss, ear pain, congestion, sore throat, rhinorrhea, sneezing, neck pain, neck stiffness and tinnitus.  Respiratory: denies SOB, DOE, cough, chest tightness, and wheezing.  Cardiovascular: admits to   palpitations   Gastrointestinal: denies nausea, vomiting, abdominal pain, diarrhea, constipation, blood in stool.  Genitourinary: denies dysuria, urgency, frequency, hematuria, flank pain and difficulty urinating.  Musculoskeletal: denies  myalgias, back pain, joint swelling, arthralgias and gait problem.   Skin: denies pallor,  rash and wound.  Neurological: denies dizziness, seizures, syncope, weakness, light-headedness, numbness and headaches.   Hematological: denies adenopathy, easy bruising, personal or family bleeding history.  Psychiatric/ Behavioral: denies suicidal ideation, mood changes, confusion, nervousness, sleep disturbance and agitation.       All other systems are reviewed and negative.    PHYSICAL EXAM: VS:  BP 158/80 mmHg  Pulse 93  Ht _0  (1.626 m)  Wt 108.069 kg (238 lb 4 oz)  BMI 40.88 kg/m2 , BMI Body mass index is 40.88 kg/(m^2). GEN: Well nourished, well developed, in no acute distress HEENT: normal Neck: no JVD, carotid bruits, or masses Cardiac: RRR; no murmurs, rubs, or gallops,no edema  Respiratory:  clear to auscultation bilaterally, normal work of breathing GI: soft, nontender, nondistended, + BS MS: no deformity or atrophy Skin: warm and dry, no rash Neuro:  Strength and sensation are intact Psych: normal   EKG:  EKG is ordered today. The ekg ordered 09/11/14  demonstrates NSR at 70.  There are no ST or T wave changes.    Recent Labs: 09/11/2014: ALT 43*; BUN 18; Creatinine, Ser 0.82; Hemoglobin 14.8; Platelets 178.0; Potassium 4.1; Sodium 138; TSH 4.58*    Lipid Panel    Component Value Date/Time   CHOL 142 09/11/2014 0848   TRIG 122.0 09/11/2014 0848   HDL 44.40 09/11/2014 0848   CHOLHDL 3 09/11/2014 0848   VLDL 24.4 09/11/2014 0848   LDLCALC 73 09/11/2014 0848      Wt Readings from Last 3 Encounters:  11/27/14 108.069 kg (238 lb 4 oz)  09/18/14 108.863 kg (240 lb)  09/11/14 108.183 kg (238 lb 8 oz)      Other studies Reviewed: Additional studies/ records that were reviewed today include: . Review of the above records demonstrates:    ASSESSMENT AND PLAN:  1.  Paroxysmal Atrial Fib:  Possibilities include atrial fib vs. SVT. ( [place] = 5, female, CAD  HTN, DM, age > 23)  She has been on ASA for a stent in 2004.  Will DC the ASA and start  Eliquis 5 mg BID.     2. Coronary artery disease. We have a cath report from 2006. At that time she had moderate coronary artery disease in her distal LAD. The mid LAD had a patent drug-eluting stent. Left ventricular systolic function was normal with an EF of 60%. She's not having any episodes of angina. We'll continue with her current medications.  I will see her back in several months .     Current medicines are reviewed at length with the patient today.  The patient does not have concerns regarding medicines.  The following changes have been made:  no change  Labs/ tests ordered today include:   Orders Placed This  Encounter  Procedures  . EKG 12-Lead     Disposition:   FU with me in 1-2 months .     Signed, Mayer Vondrak, Wonda Cheng, MD  11/27/2014 8:50 AM    [facility] [place], [place], [place]  [address] Phone: [phone]; Fax: [phone]

## 2014-11-27 NOTE — Patient Instructions (Addendum)
Medication Instructions: - Increase Toprol (metoprolol succinate) to 100 mg once daily - Start Eliquis 5 mg one tablet by mouth twice daily (samples given today)  Check the price of the following anticoagulants:  Pradaxa 150 mg twice a day Xarelto 20 mg a day Eliquis 5 mg twice a day Savaysa 60 mg a day.  ** if you find that there is a cheaper alternative to Eliquis based on your insurance plan, let us know and we will switch this **  Labwork: - none  Procedures/Testing: - none  Follow-Up: - Your physician recommends that you schedule a follow-up appointment in: 3 months with Dr. Elease HashimotoNahser  Any Additional Special Instructions Will Be Listed Below (If Applicable).

## 2014-11-28 ENCOUNTER — Ambulatory Visit (INDEPENDENT_AMBULATORY_CARE_PROVIDER_SITE_OTHER): Payer: Commercial Managed Care - HMO | Admitting: Nurse Practitioner

## 2014-11-28 VITALS — BP 126/78 | HR 87 | Temp 98.5°F | Resp 16 | Ht 64.0 in | Wt 239.4 lb

## 2014-11-28 DIAGNOSIS — J069 Acute upper respiratory infection, unspecified: Secondary | ICD-10-CM | POA: Diagnosis not present

## 2014-11-28 MED ORDER — AZITHROMYCIN 250 MG PO TABS: ORAL_TABLET | ORAL | Status: DC

## 2014-11-28 NOTE — Progress Notes (Signed)
Pre visit review using our clinic review tool, if applicable. No additional management support is needed unless otherwise documented below in the visit note. 

## 2014-11-28 NOTE — Patient Instructions (Signed)
Benadryl at night time. Helps with sleep and that tickle.   Z-pack as directed. Continue with Diabetic Tussin   Please take a probiotic ( Align, Floraque or Culturelle) while you are on the antibiotic to prevent a serious antibiotic associated diarrhea  Called clostirudium dificile colitis and a vaginal yeast infection.   Call us if not improved in 7 days.

## 2014-11-28 NOTE — Progress Notes (Signed)
   Subjective:    Patient ID: Gabriela Reed, female    DOB: 12/07/1940, 74 y.o.   MRN: [id number]  HPI  Gabriela Reed is a 74 yo female with a cough x 2 weeks.   1) Cough x 2 weeks, worse last night, not sleeping well, gagging after cough. Feels she is barking. 1st week rhinorrhea with yellowish sputum, non-productive cough.  Diabetic tussin  Cough drops 130's and under, recently due to illness has 170's.   Review of Systems  Constitutional: Negative for fever, chills, diaphoresis and fatigue.  HENT: Positive for postnasal drip, rhinorrhea, sinus pressure and sore throat. Negative for congestion, ear discharge, ear pain and sneezing.   Eyes: Negative for discharge, itching and visual disturbance.  Respiratory: Positive for cough. Negative for chest tightness, shortness of breath and wheezing.   Cardiovascular: Negative for chest pain, palpitations and leg swelling.  Gastrointestinal: Negative for nausea, vomiting and diarrhea.  Skin: Negative for rash.  Neurological: Positive for headaches. Negative for dizziness and numbness.      Objective:   Physical Exam  Constitutional: She is oriented to person, place, and time. She appears well-developed and well-nourished. No distress.  BP 126/78 mmHg  Pulse 87  Temp(Src) 98.5 F (36.9 C)  Resp 16  Ht 5\' 4"  (1.626 m)  Wt 239 lb 6.4 oz (108.591 kg)  BMI 41.07 kg/m2  SpO2 98%   HENT:  Head: Normocephalic and atraumatic.  Right Ear: External ear normal.  Left Ear: External ear normal.  Mouth/Throat: No oropharyngeal exudate.  Cardiovascular: Normal rate, regular rhythm and normal heart sounds.  Exam reveals no gallop and no friction rub.   No murmur heard. Pulmonary/Chest: Effort normal and breath sounds normal. No respiratory distress. She has no wheezes. She has no rales. She exhibits no tenderness.  Neurological: She is alert and oriented to person, place, and time. No cranial nerve deficit. She exhibits normal muscle tone.  Coordination normal.  Skin: Skin is warm and dry. No rash noted. She is not diaphoretic.  Psychiatric: She has a normal mood and affect. Her behavior is normal. Judgment and thought content normal.      Assessment & Plan:  Acute URI  1) Benadryl at night for pndrip 2) Z-pack due to duration of 14 days of worsening symptoms 3) Encouraged probiotics 4) FU prn worsening/failure to improve.

## 2014-12-03 ENCOUNTER — Telehealth: Payer: Self-pay | Admitting: Internal Medicine

## 2014-12-03 ENCOUNTER — Other Ambulatory Visit: Payer: Self-pay | Admitting: Internal Medicine

## 2014-12-12 ENCOUNTER — Encounter: Payer: Self-pay | Admitting: Nurse Practitioner

## 2014-12-15 ENCOUNTER — Telehealth: Payer: Self-pay | Admitting: *Deleted

## 2014-12-15 ENCOUNTER — Other Ambulatory Visit: Payer: Self-pay | Admitting: *Deleted

## 2014-12-15 ENCOUNTER — Encounter (INDEPENDENT_AMBULATORY_CARE_PROVIDER_SITE_OTHER): Payer: Commercial Managed Care - HMO

## 2014-12-15 ENCOUNTER — Other Ambulatory Visit: Payer: Self-pay | Admitting: Internal Medicine

## 2014-12-15 MED ORDER — APIXABAN 5 MG PO TABS: 5.0000 mg | ORAL_TABLET | Freq: Two times a day (BID) | ORAL | Status: DC

## 2014-12-15 NOTE — Telephone Encounter (Signed)
°  1. Which medications need to be refilled? ELIQUIS   2. Which pharmacy is medication to be sent to?[facility] on [address]   3. Do they need a 30 day or 90 day supply? 30   4. Would they like a call back once the medication has been sent to the pharmacy?

## 2015-01-13 LAB — HM DIABETES EYE EXAM

## 2015-01-21 ENCOUNTER — Encounter: Payer: Self-pay | Admitting: Internal Medicine

## 2015-02-25 ENCOUNTER — Encounter: Payer: Self-pay | Admitting: Cardiovascular Disease

## 2015-02-25 ENCOUNTER — Ambulatory Visit (INDEPENDENT_AMBULATORY_CARE_PROVIDER_SITE_OTHER): Payer: Commercial Managed Care - HMO | Admitting: Cardiovascular Disease

## 2015-02-25 VITALS — BP 138/76 | HR 74 | Ht 64.0 in | Wt 237.5 lb

## 2015-02-25 DIAGNOSIS — I4891 Unspecified atrial fibrillation: Secondary | ICD-10-CM

## 2015-02-25 DIAGNOSIS — I2510 Atherosclerotic heart disease of native coronary artery without angina pectoris: Secondary | ICD-10-CM | POA: Diagnosis not present

## 2015-02-25 DIAGNOSIS — E785 Hyperlipidemia, unspecified: Secondary | ICD-10-CM

## 2015-02-25 DIAGNOSIS — I10 Essential (primary) hypertension: Secondary | ICD-10-CM | POA: Diagnosis not present

## 2015-02-25 NOTE — Progress Notes (Signed)
Cardiology Office Note   Date:  02/25/2015   ID:  Gabriela Reed, DOB [birth year], MRN [id number]  PCP:  Wynona Dove, MD  Cardiologist:   Vesta Mixer, MD   Chief Complaint  Patient presents with  . other    C/o stomach ache, headache and body aches. Meds reviewed verbally with pt.     Problem list 1. Coronary artery disease - s/p stenting  2. Hyperlipidemia 3. Hypertension 4. Diabetes mellitus 5. Paroxysmal atrial fib:      History of Present Illness: Gabriela Reed is a 74 y.o. female who presents for evaluation of palpitations.  She describes 2 episodes of fast HR. Was not able to tell how fast her HR was. Lasted 5 minutes.  Felt a bit dizzy. No CP , no dyspnea.   At other times, she may have some tightness in her chest .  Also has a slight headach. Not associated with any specific activity  ( exercise, eating, drinking) . She thinks the HR may be irregular.  Since her stent placement in 2004, she has not had any further CP .   November 27, 2014: Gabriela Reed wore an event monitor and was found to have atrial fibrillation . She has a cold today   Sept. 28, 2016:  Gabriela Reed is seen today for follow up of her CAD and paroxysmal atrial fib.  She is in NSR today . Is having lots of body aches, leg aches, headaches  She thinks its due to the Eliquis and wants to stop taking the eliquis.  We discussed her CHADS2Vasc score and the fact that she is at risk for CVA She is on Atorvastatin and I have suggested that this is the cause.Marland Kitchen    Past Medical History  Diagnosis Date  . Chicken pox   . Diabetes mellitus   . Glaucoma   . Heart disease   . Hyperlipidemia   . Hypertension   . Heart murmur   . Coronary artery disease   . Heart palpitations     Past Surgical History  Procedure Laterality Date  . Tonsillectomy and adenoidectomy  1949  . Coronary artery stent  2004  . Cardiac catheterization      [place] x1 stent     Current Outpatient Prescriptions   Medication Sig Dispense Refill  . ACCU-CHEK SMARTVIEW test strip USE AS DIRECTED 50 each 6  . apixaban (ELIQUIS) 5 MG TABS tablet Take 1 tablet (5 mg total) by mouth 2 (two) times daily. 60 tablet 3  . aspirin 81 MG tablet Take 81 mg by mouth daily.    Marland Kitchen atorvastatin (LIPITOR) 20 MG tablet Take 1 tablet (20 mg total) by mouth daily. 90 tablet 3  . desonide (DESOWEN) 0.05 % cream Apply 1 application topically as needed. 60 g 3  . glucose blood test strip 1 each by Other route as needed for other. Use as instructed, to test blood sugar every other day.    . hydrochlorothiazide (HYDRODIURIL) 25 MG tablet TAKE ONE TABLET BY MOUTH ONCE DAILY 90 tablet 1  . Lancets MISC Use as directed 100 each 1  . metFORMIN (GLUCOPHAGE-XR) 500 MG 24 hr tablet Take 500 mg in the morning and 1000 mg at night time. 270 tablet 3  . metoprolol succinate (TOPROL-XL) 100 MG 24 hr tablet Take 1 tablet (100 mg total) by mouth daily. Take with or immediately following a meal. 90 tablet 3  . Travoprost, BAK Free, (TRAVATAN) 0.004 % SOLN ophthalmic solution Place 1 drop  into the left eye at bedtime.     No current facility-administered medications for this visit.    Allergies:   Niaspan and Lisinopril    Social History:  The patient  reports that she has quit smoking. She has never used smokeless tobacco. She reports that she drinks alcohol. She reports that she does not use illicit drugs.   Family History:  The patient's family history includes Heart disease (age of onset: 75) in her father; Hyperlipidemia in her father and mother; Hypertension in her father and mother.    ROS:  Please see the history of present illness.    Review of Systems: Constitutional:  denies fever, chills, diaphoresis, appetite change and fatigue.  HEENT: denies photophobia, eye pain, redness, hearing loss, ear pain, congestion, sore throat, rhinorrhea, sneezing, neck pain, neck stiffness and tinnitus.  Respiratory: denies SOB, DOE, cough,  chest tightness, and wheezing.  Cardiovascular: admits to   palpitations   Gastrointestinal: denies nausea, vomiting, abdominal pain, diarrhea, constipation, blood in stool.  Genitourinary: denies dysuria, urgency, frequency, hematuria, flank pain and difficulty urinating.  Musculoskeletal: denies  myalgias, back pain, joint swelling, arthralgias and gait problem.   Skin: denies pallor, rash and wound.  Neurological: denies dizziness, seizures, syncope, weakness, light-headedness, numbness and headaches.   Hematological: denies adenopathy, easy bruising, personal or family bleeding history.  Psychiatric/ Behavioral: denies suicidal ideation, mood changes, confusion, nervousness, sleep disturbance and agitation.       All other systems are reviewed and negative.    PHYSICAL EXAM: VS:  Ht  (1.626 m)  Wt 107.729 kg (237 lb 8 oz)  BMI 40.75 kg/m2 , BMI Body mass index is 40.75 kg/(m^2). GEN: Well nourished, well developed, in no acute distress HEENT: normal Neck: no JVD, carotid bruits, or masses Cardiac: RRR; no murmurs, rubs, or gallops,no edema  Respiratory:  clear to auscultation bilaterally, normal work of breathing GI: soft, nontender, nondistended, + BS MS: no deformity or atrophy Skin: warm and dry, no rash Neuro:  Strength and sensation are intact Psych: normal   EKG:  EKG is ordered today. The ekg ordered 02/25/2015 demonstrates NSR at 74.  There are no ST or T wave changes.    Recent Labs: 09/11/2014: ALT 43*; BUN 18; Creatinine, Ser 0.82; Hemoglobin 14.8; Platelets 178.0; Potassium 4.1; Sodium 138; TSH 4.58*    Lipid Panel    Component Value Date/Time   CHOL 142 09/11/2014 0848   TRIG 122.0 09/11/2014 0848   HDL 44.40 09/11/2014 0848   CHOLHDL 3 09/11/2014 0848   VLDL 24.4 09/11/2014 0848   LDLCALC 73 09/11/2014 0848      Wt Readings from Last 3 Encounters:  02/25/15 107.729 kg (237 lb 8 oz)  11/28/14 108.591 kg (239 lb 6.4 oz)  11/27/14 108.069 kg  (238 lb 4 oz)      Other studies Reviewed: Additional studies/ records that were reviewed today include: . Review of the above records demonstrates:    ASSESSMENT AND PLAN:  1.  Paroxysmal Atrial Fib:  Possibilities include atrial fib vs. SVT. ( CHADS2VASC = 5, female, CAD  HTN, DM, age > 71)  She thinks that the Eliquis is causing diffuse muscle aches and pains. I told her that that is not typically a side effect of the Eliquis but rather it would be more likely to be due to the atorvastatin. She would like to stop the Eliquis. With her CHADS2VASC score of 5 , I've recommended that she not stop the Eliquis. I've  recommended that she hold atorvastatin for several weeks to see if that helps.  She is in normal sinus rhythm today.    2. Coronary artery disease. We have a cath report from 2006. At that time she had moderate coronary artery disease in her distal LAD. The mid LAD had a patent drug-eluting stent. Left ventricular systolic function was normal with an EF of 60%. She's not having any episodes of angina. We'll continue with her current medications.  3. Hyperlipidemia: Her lipids are checked by her primary medical doctor. Her lipid levels are well controlled. Continue atorvastatin. I've given her the okay to hold the atorvastatin for a month to see if that helps with the diffuse muscle aches.  She may need to try coenzyme Q 10 or might need to try different statin. Encouraged her to work on a diet and exercise program.  She'll return to see [place] in 6 months.   Current medicines are reviewed at length with the patient today.  The patient does not have concerns regarding medicines.  The following changes have been made:  no change  Labs/ tests ordered today include:   Orders Placed This Encounter  Procedures  . EKG 12-Lead     Disposition:   FU with [place] in 6 months.      Signed, Nahser, Deloris Ping, MD  02/25/2015 8:07 AM    [facility] [address]  [place], [place], [place]  [address] Phone: [phone]; Fax: [phone]

## 2015-02-25 NOTE — Patient Instructions (Addendum)
Medication Instructions:  Your physician recommends that you continue on your current medications as directed. Please refer to the Current Medication list given to you today.   Labwork: none  Testing/Procedures: none  Follow-Up: Your physician wants you to follow-up in: six months. You will receive a reminder letter in the mail two months in advance. If you don't receive a letter, please call our office to schedule the follow-up appointment.   Any Other Special Instructions Will Be Listed Below (If Applicable).  Check the price of the following anticoagulants:  Pradaxa 150 mg twice a day Xarelto 20 mg a day Eliquis 5 mg twice a day Savaysa 60 mg a day.  The only other option is coumadin.

## 2015-03-13 ENCOUNTER — Ambulatory Visit (INDEPENDENT_AMBULATORY_CARE_PROVIDER_SITE_OTHER): Payer: Commercial Managed Care - HMO | Admitting: Internal Medicine

## 2015-03-13 ENCOUNTER — Encounter: Payer: Self-pay | Admitting: Internal Medicine

## 2015-03-13 VITALS — BP 134/77 | HR 74 | Temp 98.0°F | Ht 64.0 in | Wt 238.2 lb

## 2015-03-13 DIAGNOSIS — E785 Hyperlipidemia, unspecified: Secondary | ICD-10-CM | POA: Diagnosis not present

## 2015-03-13 DIAGNOSIS — I480 Paroxysmal atrial fibrillation: Secondary | ICD-10-CM | POA: Diagnosis not present

## 2015-03-13 DIAGNOSIS — E118 Type 2 diabetes mellitus with unspecified complications: Secondary | ICD-10-CM

## 2015-03-13 DIAGNOSIS — Z23 Encounter for immunization: Secondary | ICD-10-CM | POA: Diagnosis not present

## 2015-03-13 DIAGNOSIS — I2510 Atherosclerotic heart disease of native coronary artery without angina pectoris: Secondary | ICD-10-CM

## 2015-03-13 DIAGNOSIS — I10 Essential (primary) hypertension: Secondary | ICD-10-CM | POA: Diagnosis not present

## 2015-03-13 LAB — COMPREHENSIVE METABOLIC PANEL
ALBUMIN: 4.4 g/dL (ref 3.5–5.2)
ALK PHOS: 65 U/L (ref 39–117)
ALT: 42 U/L — AB (ref 0–35)
AST: 32 U/L (ref 0–37)
BILIRUBIN TOTAL: 0.5 mg/dL (ref 0.2–1.2)
BUN: 26 mg/dL — ABNORMAL HIGH (ref 6–23)
CO2: 29 mEq/L (ref 19–32)
Calcium: 9.8 mg/dL (ref 8.4–10.5)
Chloride: 100 mEq/L (ref 96–112)
Creatinine, Ser: 0.83 mg/dL (ref 0.40–1.20)
GFR: 71.78 mL/min (ref >60.00)
Glucose, Bld: 155 mg/dL — ABNORMAL HIGH (ref 70–99)
Potassium: 4.1 mEq/L (ref 3.5–5.1)
Sodium: 139 mEq/L (ref 135–145)
TOTAL PROTEIN: 7.6 g/dL (ref 6.0–8.3)

## 2015-03-13 LAB — LIPID PANEL
CHOLESTEROL: 162 mg/dL (ref 0–200)
HDL: 49 mg/dL (ref >39.00)
LDL Cholesterol: 88 mg/dL (ref 0–99)
NONHDL: 112.63
Total CHOL/HDL Ratio: 3
Triglycerides: 125 mg/dL (ref 0.0–149.0)
VLDL: 25 mg/dL (ref 0.0–40.0)

## 2015-03-13 LAB — HEMOGLOBIN A1C: HEMOGLOBIN A1C: 7.5 % — AB (ref 4.6–6.5)

## 2015-03-13 MED ORDER — HYDROCHLOROTHIAZIDE 25 MG PO TABS: 25.0000 mg | ORAL_TABLET | Freq: Every day | ORAL | Status: DC

## 2015-03-13 MED ORDER — DESONIDE 0.05 % EX CREA: 1.0000 "application " | TOPICAL_CREAM | CUTANEOUS | Status: DC | PRN

## 2015-03-13 MED ORDER — SITAGLIPTIN PHOSPHATE 25 MG PO TABS: 25.0000 mg | ORAL_TABLET | Freq: Every day | ORAL | Status: DC

## 2015-03-13 NOTE — Assessment & Plan Note (Signed)
Wt Readings from Last 3 Encounters:  03/13/15 238 lb 4 oz (108.069 kg)  02/25/15 237 lb 8 oz (107.729 kg)  11/28/14 239 lb 6.4 oz (108.591 kg)   Body mass index is 40.88 kg/(m^2). Encouraged healthy diet and exercise. Will start Januvia with goal of better glycemic control and reduced appetite.

## 2015-03-13 NOTE — Progress Notes (Signed)
Subjective:    Patient ID: Gabriela Reed, female    DOB: 03/02/1941, 74 y.o.   MRN: [id number]  HPI  74YO female presents for follow up.  DM - Concerned that BG have been higher, near 150s fasting. Metformin is passing through, she sees in toilet. No diarrhea. Just appears to be not absorbing the tablets.  AFIB - Continues on Eliquis. No recent palpitations, chest pain. No bleeding or bruising.  HL - Never stopped Atorvastatin. Continues to take regularly. No further myalgias noted.    Wt Readings from Last 3 Encounters:  03/13/15 238 lb 4 oz (108.069 kg)  02/25/15 237 lb 8 oz (107.729 kg)  11/28/14 239 lb 6.4 oz (108.591 kg)   BP Readings from Last 3 Encounters:  03/13/15 134/77  02/25/15 138/76  11/28/14 126/78    Past Medical History  Diagnosis Date  . Chicken pox   . Diabetes mellitus   . Glaucoma   . Heart disease   . Hyperlipidemia   . Hypertension   . Heart murmur   . Coronary artery disease   . Heart palpitations    Family History  Problem Relation Age of Onset  . Hyperlipidemia Mother   . Hypertension Mother   . Hyperlipidemia Father   . Hypertension Father   . Heart disease Father [id number]   Past Surgical History  Procedure Laterality Date  . Tonsillectomy and adenoidectomy  1949  . Coronary artery stent  2004  . Cardiac catheterization      [place] x1 stent   Social History   Social History  . Marital Status: Divorced    Spouse Name: N/A  . Number of Children: N/A  . Years of Education: N/A   Social History Main Topics  . Smoking status: Former Games developermoker  . Smokeless tobacco: Never Used     Comment: quit 25 years ago  . Alcohol Use: Yes     Comment: very rarely  . Drug Use: No  . Sexual Activity: Not Asked   Other Topics Concern  . None   Social History Narrative   [place] Cat in home.      Work Water engineer- manager at [employer]   Diet - regular   Exercise - [employer]    Review of Systems  Constitutional: Negative for fever,  chills, appetite change, fatigue and unexpected weight change.  Eyes: Negative for visual disturbance.  Respiratory: Negative for cough and shortness of breath.   Cardiovascular: Negative for chest pain and leg swelling.  Gastrointestinal: Negative for nausea, vomiting, abdominal pain, diarrhea and constipation.  Musculoskeletal: Negative for myalgias and arthralgias.  Skin: Negative for color change and rash.  Hematological: Negative for adenopathy. Does not bruise/bleed easily.  Psychiatric/Behavioral: Negative for sleep disturbance and dysphoric mood. The patient is not nervous/anxious.        Objective:    BP 134/77 mmHg  Pulse 74  Temp(Src) 98 F (36.7 C) (Oral)  Ht 5\' 4"  (1.626 m)  Wt 238 lb 4 oz (108.069 kg)  BMI 40.88 kg/m2  SpO2 96% Physical Exam  Constitutional: She is oriented to person, place, and time. She appears well-developed and well-nourished. No distress.  HENT:  Head: Normocephalic and atraumatic.  Right Ear: External ear normal.  Left Ear: External ear normal.  Nose: Nose normal.  Mouth/Throat: Oropharynx is clear and moist. No oropharyngeal exudate.  Eyes: Conjunctivae are normal. Pupils are equal, round, and reactive to light. Right eye exhibits no discharge. Left eye exhibits no discharge. No scleral  icterus.  Neck: Normal range of motion. Neck supple. No tracheal deviation present. No thyromegaly present.  Cardiovascular: Normal rate, regular rhythm, normal heart sounds and intact distal pulses.  Exam reveals no gallop and no friction rub.   No murmur heard. Pulmonary/Chest: Effort normal and breath sounds normal. No respiratory distress. She has no wheezes. She has no rales. She exhibits no tenderness.  Musculoskeletal: Normal range of motion. She exhibits no edema or tenderness.  Lymphadenopathy:    She has no cervical adenopathy.  Neurological: She is alert and oriented to person, place, and time. No cranial nerve deficit. She exhibits normal muscle  tone. Coordination normal.  Skin: Skin is warm and dry. No rash noted. She is not diaphoretic. No erythema. No pallor.  Psychiatric: She has a normal mood and affect. Her behavior is normal. Judgment and thought content normal.          Assessment & Plan:   Problem List Items Addressed This Visit      Unprioritized   Atrial fibrillation (HCC) - Primary    NSR on exam today. Continue Metoprolol and anticoagulation with Eliquis. Follow up with cardiology as scheduled.       Relevant Medications   hydrochlorothiazide (HYDRODIURIL) 25 MG tablet   Coronary artery disease    Symptomatically, doing well. Continue current medications.      Relevant Medications   hydrochlorothiazide (HYDRODIURIL) 25 MG tablet   Diabetes mellitus type 2, controlled (HCC)    BG recently more elevated. Will add Januvia  daily. Continue metformin. Discussed potential side effects of Januvia. Recheck 4 weeks.      Relevant Medications   sitaGLIPtin (JANUVIA) 25 MG tablet   Other Relevant Orders   Comprehensive metabolic panel   Hemoglobin A1c   Hyperlipidemia with target LDL less than 70    Recheck lipids with labs today.      Relevant Medications   hydrochlorothiazide (HYDRODIURIL) 25 MG tablet   Other Relevant Orders   Lipid panel   Hypertension    BP Readings from Last 3 Encounters:  03/13/15 134/77  02/25/15 138/76  11/28/14 126/78   BP well controlled. Renal function with labs. Continue Metoprolol and HCTZ      Relevant Medications   hydrochlorothiazide (HYDRODIURIL) 25 MG tablet   Morbid obesity (HCC)    Wt Readings from Last 3 Encounters:  03/13/15 238 lb 4 oz (108.069 kg)  02/25/15 237 lb 8 oz (107.729 kg)  11/28/14 239 lb 6.4 oz (108.591 kg)   Body mass index is 40.88 kg/(m^2). Encouraged healthy diet and exercise. Will start Januvia with goal of better glycemic control and reduced appetite.      Relevant Medications   sitaGLIPtin (JANUVIA) 25 MG tablet        Return in about 4 weeks (around 04/10/2015) for Recheck of Diabetes follow up med change.

## 2015-03-13 NOTE — Progress Notes (Signed)
Pre visit review using our clinic review tool, if applicable. No additional management support is needed unless otherwise documented below in the visit note. 

## 2015-03-13 NOTE — Assessment & Plan Note (Signed)
BG recently more elevated. Will add Januvia 25mg  daily. Continue metformin. Discussed potential side effects of Januvia. Recheck 4 weeks.

## 2015-03-13 NOTE — Assessment & Plan Note (Signed)
NSR on exam today. Continue Metoprolol and anticoagulation with Eliquis. Follow up with cardiology as scheduled.

## 2015-03-13 NOTE — Patient Instructions (Addendum)
Start Januvia 25mg  daily in the morning.   Continue Metformin.  Follow up in 4 weeks.

## 2015-03-13 NOTE — Assessment & Plan Note (Signed)
-   Recheck lipids with labs today

## 2015-03-13 NOTE — Assessment & Plan Note (Signed)
Symptomatically, doing well. Continue current medications. 

## 2015-03-13 NOTE — Assessment & Plan Note (Signed)
BP Readings from Last 3 Encounters:  03/13/15 134/77  02/25/15 138/76  11/28/14 126/78   BP well controlled. Renal function with labs. Continue Metoprolol and HCTZ

## 2015-04-16 ENCOUNTER — Encounter: Payer: Self-pay | Admitting: Internal Medicine

## 2015-04-16 ENCOUNTER — Ambulatory Visit (INDEPENDENT_AMBULATORY_CARE_PROVIDER_SITE_OTHER): Payer: Commercial Managed Care - HMO | Admitting: Internal Medicine

## 2015-04-16 VITALS — BP 132/80 | HR 91 | Temp 98.4°F | Ht 64.0 in | Wt 238.2 lb

## 2015-04-16 DIAGNOSIS — I480 Paroxysmal atrial fibrillation: Secondary | ICD-10-CM | POA: Diagnosis not present

## 2015-04-16 DIAGNOSIS — I10 Essential (primary) hypertension: Secondary | ICD-10-CM | POA: Diagnosis not present

## 2015-04-16 DIAGNOSIS — E118 Type 2 diabetes mellitus with unspecified complications: Secondary | ICD-10-CM

## 2015-04-16 NOTE — Assessment & Plan Note (Signed)
She reports BG well controlled. Will plan to recheck A1c in 3 months. Continue Januvia and metformin.

## 2015-04-16 NOTE — Patient Instructions (Signed)
Please consider an alternative anticoagulant to Eliquis.  Please consider an earlier visit with Cardiology.

## 2015-04-16 NOTE — Assessment & Plan Note (Signed)
BP Readings from Last 3 Encounters:  04/16/15 132/80  03/13/15 134/77  02/25/15 138/76   BP well controlled. Continue current medication.

## 2015-04-16 NOTE — Progress Notes (Signed)
Subjective:    Patient ID: Gabriela Reed, female    DOB: 11-14-1940, 74 y.o.   MRN: [id number]  HPI  74YO female presents for follow up.  DM - Starting on Januvia. Tolerating well. BG near 130s initially.  AFIB - Stopped Eliquis as was not feeling well on medication. Had aching pain in legs and diarrhea. Felt much better after stopping. Taking Aspirin only. Denies palpitations, chest pain, dyspnea.  Obesity - starting in water classes and walking. Not following any diet.  HCPOA - daughter, Gabriela Reed from Last 3 Encounters:  04/16/15 238 lb 4 oz (108.069 kg)  03/13/15 238 lb 4 oz (108.069 kg)  02/25/15 237 lb 8 oz (107.729 kg)   BP Reed from Last 3 Encounters:  04/16/15 132/80  03/13/15 134/77  02/25/15 138/76    Past Medical History  Diagnosis Date  . Chicken pox   . Diabetes mellitus   . Glaucoma   . Heart disease   . Hyperlipidemia   . Hypertension   . Heart murmur   . Coronary artery disease   . Heart palpitations    Family History  Problem Relation Age of Onset  . Hyperlipidemia Mother   . Hypertension Mother   . Hyperlipidemia Father   . Hypertension Father   . Heart disease Father 15   Past Surgical History  Procedure Laterality Date  . Tonsillectomy and adenoidectomy  1949  . Coronary artery stent  2004  . Cardiac catheterization      [place] x1 stent   Social History   Social History  . Marital Status: Divorced    Spouse Name: N/A  . Number of Children: N/A  . Years of Education: N/A   Social History Main Topics  . Smoking status: Former Games developer  . Smokeless tobacco: Never Used     Comment: quit 25 years ago  . Alcohol Use: Yes     Comment: very rarely  . Drug Use: No  . Sexual Activity: Not Asked   Other Topics Concern  . None   Social History Narrative   Lives in [place]. Cat in home.      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  Eyes: Negative for visual disturbance.  Respiratory: Negative for shortness of breath.   Cardiovascular: Negative for chest pain, palpitations and leg swelling.  Gastrointestinal: Negative for nausea, vomiting, abdominal pain, diarrhea and constipation.  Musculoskeletal: Negative for myalgias and arthralgias.  Skin: Negative for color change and rash.  Hematological: Negative for adenopathy. Does not bruise/bleed easily.  Psychiatric/Behavioral: Negative for sleep disturbance and dysphoric mood. The patient is not nervous/anxious.        Objective:    BP 132/80 mmHg  Pulse 91  Temp(Src) 98.4 F (36.9 C) (Oral)  Ht  (1.626 m)  Wt 238 lb 4 oz (108.069 kg)  BMI 40.88 kg/m2  SpO2 97% Physical Exam  Constitutional: She is oriented to person, place, and time. She appears well-developed and well-nourished. No distress.  HENT:  Head: Normocephalic and atraumatic.  Right Ear: External ear normal.  Left Ear: External ear normal.  Nose: Nose normal.  Mouth/Throat: Oropharynx is clear and moist. No oropharyngeal exudate.  Eyes: Conjunctivae are normal. Pupils are equal, round, and reactive to light. Right eye exhibits no discharge. Left eye exhibits no discharge. No scleral  icterus.  Neck: Normal range of motion. Neck supple. No tracheal deviation present. No thyromegaly present.  Cardiovascular: Normal rate, normal heart sounds and intact distal pulses.  An irregularly irregular rhythm present. Exam reveals no gallop and no friction rub.   No murmur heard. Pulmonary/Chest: Effort normal and breath sounds normal. No respiratory distress. She has no wheezes. She has no rales. She exhibits no tenderness.  Musculoskeletal: Normal range of motion. She exhibits no edema or tenderness.  Lymphadenopathy:    She has no cervical adenopathy.  Neurological: She is alert and oriented to person, place, and time. No cranial nerve  deficit. She exhibits normal muscle tone. Coordination normal.  Skin: Skin is warm and dry. No rash noted. She is not diaphoretic. No erythema. No pallor.  Psychiatric: She has a normal mood and affect. Her behavior is normal. Judgment and thought content normal.          Assessment & Plan:   Problem List Items Addressed This Visit      Unprioritized   Atrial fibrillation (HCC) - Primary    Discussed risks of stopping anticoagulation with CHADS score 5. Strongly, repeatedly encouraged her to consider alternative anticoagulation, such as Xarelta, Pradaxa or Coumadin. She refuses. Encouraged her to consider an earlier cardiology evaluation. She refuses. Will continue Metoprolol and Aspirin.      Diabetes mellitus type 2, controlled (HCC)    She reports BG well controlled. Will plan to recheck A1c in 3 months. Continue Januvia and metformin.      Relevant Orders   Comprehensive metabolic panel   Hemoglobin A1c   Hypertension   Morbid obesity (HCC)    Wt Reed from Last 3 Encounters:  04/16/15 238 lb 4 oz (108.069 kg)  03/13/15 238 lb 4 oz (108.069 kg)  02/25/15 237 lb 8 oz (107.729 kg)   Body mass index is 40.88 kg/(m^2). Encouraged her to follow healthy diet and exercise.           Return in about 3 months (around 07/17/2015) for Recheck of Diabetes.

## 2015-04-16 NOTE — Assessment & Plan Note (Signed)
Discussed risks of stopping anticoagulation with CHADS score 5. Strongly, repeatedly encouraged her to consider alternative anticoagulation, such as Xarelta, Pradaxa or Coumadin. She refuses. Encouraged her to consider an earlier cardiology evaluation. She refuses. Will continue Metoprolol and Aspirin.

## 2015-04-16 NOTE — Assessment & Plan Note (Signed)
Wt Readings from Last 3 Encounters:  04/16/15 238 lb 4 oz (108.069 kg)  03/13/15 238 lb 4 oz (108.069 kg)  02/25/15 237 lb 8 oz (107.729 kg)   Body mass index is 40.88 kg/(m^2). Encouraged her to follow healthy diet and exercise.

## 2015-04-16 NOTE — Progress Notes (Signed)
Pre visit review using our clinic review tool, if applicable. No additional management support is needed unless otherwise documented below in the visit note. 

## 2015-04-20 ENCOUNTER — Telehealth: Payer: Self-pay | Admitting: Internal Medicine

## 2015-04-20 NOTE — Telephone Encounter (Signed)
FYI

## 2015-04-20 NOTE — Telephone Encounter (Signed)
Pt called to inform Dr Dan HumphreysWalker that she went back on Eliquis starting today. Thank You!

## 2015-05-21 ENCOUNTER — Telehealth: Payer: Self-pay | Admitting: Cardiovascular Disease

## 2015-05-21 NOTE — Telephone Encounter (Signed)
°  ° °  Patient calling the office for samples of medication:   1. What medication and dosage are you requesting samples for?Eliquis 5 mg  2. Are you currentlyout of this medication? Almost- few left

## 2015-05-21 NOTE — Telephone Encounter (Signed)
Eliquis 5 mg samples placed at front desk for pick up. 

## 2015-05-21 NOTE — Telephone Encounter (Signed)
called and advised pt to call [place] office for samples. she is agreeable.

## 2015-05-21 NOTE — Telephone Encounter (Signed)
Patient calling the office for samples of medication:   1.  What medication and dosage are you requesting samples for?Eliquis 5 mg  2.  Are you currently out of this medication? Almost- few left

## 2015-06-24 ENCOUNTER — Observation Stay
Admission: EM | Admit: 2015-06-24 | Discharge: 2015-06-25 | Disposition: A | Discharge status: Home / Self Care | Payer: PPO | Attending: Internal Medicine | Admitting: Internal Medicine

## 2015-06-24 ENCOUNTER — Emergency Department: Payer: PPO

## 2015-06-24 ENCOUNTER — Encounter: Payer: Self-pay | Admitting: *Deleted

## 2015-06-24 DIAGNOSIS — Z8249 Family history of ischemic heart disease and other diseases of the circulatory system: Secondary | ICD-10-CM | POA: Insufficient documentation

## 2015-06-24 DIAGNOSIS — Z7189 Other specified counseling: Secondary | ICD-10-CM | POA: Insufficient documentation

## 2015-06-24 DIAGNOSIS — I10 Essential (primary) hypertension: Secondary | ICD-10-CM | POA: Diagnosis not present

## 2015-06-24 DIAGNOSIS — E785 Hyperlipidemia, unspecified: Secondary | ICD-10-CM | POA: Insufficient documentation

## 2015-06-24 DIAGNOSIS — R079 Chest pain, unspecified: Secondary | ICD-10-CM | POA: Diagnosis present

## 2015-06-24 DIAGNOSIS — Z888 Allergy status to other drugs, medicaments and biological substances status: Secondary | ICD-10-CM | POA: Insufficient documentation

## 2015-06-24 DIAGNOSIS — Z7901 Long term (current) use of anticoagulants: Secondary | ICD-10-CM | POA: Insufficient documentation

## 2015-06-24 DIAGNOSIS — Z79899 Other long term (current) drug therapy: Secondary | ICD-10-CM | POA: Insufficient documentation

## 2015-06-24 DIAGNOSIS — R001 Bradycardia, unspecified: Secondary | ICD-10-CM | POA: Insufficient documentation

## 2015-06-24 DIAGNOSIS — R200 Anesthesia of skin: Secondary | ICD-10-CM | POA: Diagnosis not present

## 2015-06-24 DIAGNOSIS — Z794 Long term (current) use of insulin: Secondary | ICD-10-CM | POA: Insufficient documentation

## 2015-06-24 DIAGNOSIS — I4891 Unspecified atrial fibrillation: Secondary | ICD-10-CM | POA: Insufficient documentation

## 2015-06-24 DIAGNOSIS — E119 Type 2 diabetes mellitus without complications: Secondary | ICD-10-CM | POA: Insufficient documentation

## 2015-06-24 DIAGNOSIS — H409 Unspecified glaucoma: Secondary | ICD-10-CM | POA: Diagnosis not present

## 2015-06-24 DIAGNOSIS — R0789 Other chest pain: Principal | ICD-10-CM | POA: Diagnosis present

## 2015-06-24 DIAGNOSIS — Z7982 Long term (current) use of aspirin: Secondary | ICD-10-CM | POA: Diagnosis not present

## 2015-06-24 DIAGNOSIS — R000 Tachycardia, unspecified: Secondary | ICD-10-CM | POA: Diagnosis not present

## 2015-06-24 DIAGNOSIS — M542 Cervicalgia: Secondary | ICD-10-CM | POA: Insufficient documentation

## 2015-06-24 DIAGNOSIS — M479 Spondylosis, unspecified: Secondary | ICD-10-CM | POA: Diagnosis not present

## 2015-06-24 DIAGNOSIS — I2510 Atherosclerotic heart disease of native coronary artery without angina pectoris: Secondary | ICD-10-CM | POA: Insufficient documentation

## 2015-06-24 DIAGNOSIS — Z832 Family history of diseases of the blood and blood-forming organs and certain disorders involving the immune mechanism: Secondary | ICD-10-CM | POA: Diagnosis not present

## 2015-06-24 DIAGNOSIS — Z87891 Personal history of nicotine dependence: Secondary | ICD-10-CM | POA: Insufficient documentation

## 2015-06-24 DIAGNOSIS — R51 Headache: Secondary | ICD-10-CM | POA: Diagnosis not present

## 2015-06-24 DIAGNOSIS — Z6841 Body Mass Index (BMI) 40.0 and over, adult: Secondary | ICD-10-CM | POA: Insufficient documentation

## 2015-06-24 DIAGNOSIS — R011 Cardiac murmur, unspecified: Secondary | ICD-10-CM | POA: Diagnosis not present

## 2015-06-24 DIAGNOSIS — Z955 Presence of coronary angioplasty implant and graft: Secondary | ICD-10-CM | POA: Insufficient documentation

## 2015-06-24 DIAGNOSIS — I480 Paroxysmal atrial fibrillation: Secondary | ICD-10-CM | POA: Insufficient documentation

## 2015-06-24 DIAGNOSIS — R002 Palpitations: Secondary | ICD-10-CM | POA: Insufficient documentation

## 2015-06-24 HISTORY — DX: Type 2 diabetes mellitus without complications: E11.9

## 2015-06-24 HISTORY — DX: Paroxysmal atrial fibrillation: I48.0

## 2015-06-24 LAB — BASIC METABOLIC PANEL
Anion gap: 12 (ref 5–15)
BUN: 20 mg/dL (ref 6–20)
CALCIUM: 9.7 mg/dL (ref 8.9–10.3)
CHLORIDE: 102 mmol/L (ref 101–111)
CO2: 26 mmol/L (ref 22–32)
CREATININE: 0.71 mg/dL (ref 0.44–1.00)
Glucose, Bld: 113 mg/dL — ABNORMAL HIGH (ref 65–99)
Potassium: 3.8 mmol/L (ref 3.5–5.1)
SODIUM: 140 mmol/L (ref 135–145)

## 2015-06-24 LAB — CBC
HCT: 42.4 % (ref 35.0–47.0)
Hemoglobin: 14.1 g/dL (ref 12.0–16.0)
MCH: 28.9 pg (ref 26.0–34.0)
MCHC: 33.2 g/dL (ref 32.0–36.0)
MCV: 87.2 fL (ref 80.0–100.0)
PLATELETS: 162 10*3/uL (ref 150–440)
RBC: 4.87 MIL/uL (ref 3.80–5.20)
RDW: 12.7 % (ref 11.5–14.5)
WBC: 6 10*3/uL (ref 3.6–11.0)

## 2015-06-24 LAB — GLUCOSE, CAPILLARY: Glucose-Capillary: 199 mg/dL — ABNORMAL HIGH (ref 65–99)

## 2015-06-24 LAB — TROPONIN I

## 2015-06-24 MED ORDER — METFORMIN HCL ER 500 MG PO TB24: 500.0000 mg | ORAL_TABLET | Freq: Every day | ORAL | Status: DC

## 2015-06-24 MED ORDER — HYDROCHLOROTHIAZIDE 25 MG PO TABS: 25.0000 mg | ORAL_TABLET | Freq: Every day | ORAL | Status: DC

## 2015-06-24 MED ORDER — METFORMIN HCL ER 500 MG PO TB24: 500.0000 mg | ORAL_TABLET | Freq: Two times a day (BID) | ORAL | Status: DC

## 2015-06-24 MED ORDER — BISACODYL 5 MG PO TBEC: 5.0000 mg | DELAYED_RELEASE_TABLET | Freq: Every day | ORAL | Status: DC | PRN

## 2015-06-24 MED ORDER — SODIUM CHLORIDE 0.9% FLUSH
3.0000 mL | Freq: Two times a day (BID) | INTRAVENOUS | Status: DC
  Administered 2015-06-24: 3 mL via INTRAVENOUS

## 2015-06-24 MED ORDER — ACETAMINOPHEN 325 MG PO TABS: 650.0000 mg | ORAL_TABLET | Freq: Four times a day (QID) | ORAL | Status: DC | PRN

## 2015-06-24 MED ORDER — ASPIRIN 81 MG PO CHEW
324.0000 mg | CHEWABLE_TABLET | Freq: Once | ORAL | Status: AC
  Administered 2015-06-24: 324 mg via ORAL
  Filled 2015-06-24: qty 4

## 2015-06-24 MED ORDER — METFORMIN HCL ER 500 MG PO TB24
1000.0000 mg | ORAL_TABLET | Freq: Every day | ORAL | Status: DC
  Administered 2015-06-24: 1000 mg via ORAL
  Filled 2015-06-24: qty 2

## 2015-06-24 MED ORDER — ACETAMINOPHEN 650 MG RE SUPP: 650.0000 mg | Freq: Four times a day (QID) | RECTAL | Status: DC | PRN

## 2015-06-24 MED ORDER — ONDANSETRON HCL 4 MG PO TABS: 4.0000 mg | ORAL_TABLET | Freq: Four times a day (QID) | ORAL | Status: DC | PRN

## 2015-06-24 MED ORDER — POLYETHYLENE GLYCOL 3350 17 G PO PACK: 17.0000 g | PACK | Freq: Every day | ORAL | Status: DC | PRN

## 2015-06-24 MED ORDER — ONDANSETRON HCL 4 MG/2ML IJ SOLN: 4.0000 mg | Freq: Four times a day (QID) | INTRAMUSCULAR | Status: DC | PRN

## 2015-06-24 MED ORDER — ASPIRIN EC 81 MG PO TBEC: 81.0000 mg | DELAYED_RELEASE_TABLET | Freq: Every day | ORAL | Status: DC

## 2015-06-24 MED ORDER — INSULIN ASPART 100 UNIT/ML ~~LOC~~ SOLN: 0.0000 [IU] | Freq: Every day | SUBCUTANEOUS | Status: DC

## 2015-06-24 MED ORDER — METOPROLOL SUCCINATE ER 100 MG PO TB24: 100.0000 mg | ORAL_TABLET | Freq: Every day | ORAL | Status: DC

## 2015-06-24 MED ORDER — ATORVASTATIN CALCIUM 20 MG PO TABS
20.0000 mg | ORAL_TABLET | Freq: Every day | ORAL | Status: DC
  Administered 2015-06-24: 20 mg via ORAL
  Filled 2015-06-24: qty 1

## 2015-06-24 MED ORDER — SODIUM CHLORIDE 0.9 % IV SOLN: 250.0000 mL | INTRAVENOUS | Status: DC | PRN

## 2015-06-24 MED ORDER — LATANOPROST 0.005 % OP SOLN
1.0000 [drp] | Freq: Every day | OPHTHALMIC | Status: DC
  Filled 2015-06-24: qty 2.5

## 2015-06-24 MED ORDER — SODIUM CHLORIDE 0.9% FLUSH: 3.0000 mL | INTRAVENOUS | Status: DC | PRN

## 2015-06-24 MED ORDER — INSULIN ASPART 100 UNIT/ML ~~LOC~~ SOLN: 0.0000 [IU] | Freq: Three times a day (TID) | SUBCUTANEOUS | Status: DC

## 2015-06-24 MED ORDER — SODIUM CHLORIDE 0.9% FLUSH: 3.0000 mL | Freq: Two times a day (BID) | INTRAVENOUS | Status: DC

## 2015-06-24 MED ORDER — ENOXAPARIN SODIUM 40 MG/0.4ML ~~LOC~~ SOLN: 40.0000 mg | SUBCUTANEOUS | Status: DC

## 2015-06-24 MED ORDER — ALUM & MAG HYDROXIDE-SIMETH 200-200-20 MG/5ML PO SUSP: 30.0000 mL | Freq: Four times a day (QID) | ORAL | Status: DC | PRN

## 2015-06-24 NOTE — ED Notes (Signed)
Pt to triage via wheelchair.  Pt was in a store and began sweating, heart racing and had chest pain with numbness in arms.  Pt now has a headache, and no chest pain.  Skin warm and dry.  No sob.  Pt alert and speech is clear.

## 2015-06-24 NOTE — ED Notes (Signed)
Pt reports having an episode of palpitations with extreme light-headedness, diaphoresis, "squeezing" chest pain and weakness (with nausea).  Pt now reports chest pain has subsided.  Cardiac rhythm currently stable in NSR.  Pt with no s/s of distress at this time.

## 2015-06-24 NOTE — ED Provider Notes (Signed)
[facility] Emergency Department Provider Note  ____________________________________________  Time seen: Approximately 4:06 PM  I have reviewed the triage vital signs and the nursing notes.   HISTORY  Chief Complaint Headache and Chest Pain    HPI Kamani Magnussen is a 75 y.o. female with history of coronary artery disease status post LAD stent in 2006, hypertension, hyperlipidemia, diabetes, paroxysmal atrial fibrillation prescribed Elliquis but has not been taking it because of "aches and pains it causes" presents for evaluation of chest tightness that began just prior to arrival, gradual onset, currently resolved, no modifying factors. The patient reports that she was in relating around the store when she felt suddenly became diaphoretic and experienced chest tightness radiating into the right shoulder and the left jaw, she was short of breath and nauseated as well as lightheaded. She was having palpitations at that time. She has episodes of palpitations frequently but generally they are not associated with any chest discomfort. She denies any recent illness include no vomiting or diarrhea, fevers or chills, no cough sneezing or runny nose. She reports her symptoms today felt similar to her heart attack. Pain was not ripping or tearing in nature, did not radiate to the back or down towards the feet. It is not pleuritic. She is complaining of mild frontal headache at this time.   Past Medical History  Diagnosis Date  . Chicken pox   . Diabetes mellitus   . Glaucoma   . Heart disease   . Hyperlipidemia   . Hypertension   . Heart murmur   . Coronary artery disease   . Heart palpitations     Patient Active Problem List   Diagnosis Date Noted  . Atrial fibrillation (HCC) 11/27/2014  . Morbid obesity (HCC) 03/05/2014  . Medicare annual wellness visit, subsequent 03/05/2014  . Arthralgia 03/05/2014  . Screening for breast cancer 09/13/2012  . Screening for  colon cancer 09/13/2012  . Coronary artery disease 03/13/2012  . Diabetes mellitus type 2, controlled (HCC) 12/12/2011  . Glaucoma 12/12/2011  . Hypertension 12/12/2011  . Hyperlipidemia with target LDL less than 70 12/12/2011    Past Surgical History  Procedure Laterality Date  . Tonsillectomy and adenoidectomy  1949  . Coronary artery stent  2004  . Cardiac catheterization      [place] x1 stent    Current Outpatient Rx  Name  Route  Sig  Dispense  Refill  . ACCU-CHEK SMARTVIEW test strip      USE AS DIRECTED   50 each   6   . atorvastatin (LIPITOR) 20 MG tablet   Oral   Take 1 tablet (20 mg total) by mouth daily.   90 tablet   3   . desonide (DESOWEN) 0.05 % cream   Topical   Apply 1 application topically as needed.   60 g   3   . glucose blood test strip   Other   1 each by Other route as needed for other. Use as instructed, to test blood sugar every other day.         . hydrochlorothiazide (HYDRODIURIL) 25 MG tablet   Oral   Take 1 tablet (25 mg total) by mouth daily.   90 tablet   1   . Lancets MISC      Use as directed   100 each   1   . metFORMIN (GLUCOPHAGE-XR) 500 MG 24 hr tablet      Take 500 mg in the morning and 1000  mg at night time.   270 tablet   3     Please have this as updated script for this patien ...   . metoprolol succinate (TOPROL-XL) 100 MG 24 hr tablet   Oral   Take 1 tablet (100 mg total) by mouth daily. Take with or immediately following a meal.   90 tablet   3   . sitaGLIPtin (JANUVIA) 25 MG tablet   Oral   Take 1 tablet (25 mg total) by mouth daily.   30 tablet   3   . Travoprost, BAK Free, (TRAVATAN) 0.004 % SOLN ophthalmic solution   Left Eye   Place 1 drop into the left eye at bedtime.           Allergies Niaspan and Lisinopril  Family History  Problem Relation Age of Onset  . Hyperlipidemia Mother   . Hypertension Mother   . Hyperlipidemia Father   . Hypertension Father   . Heart disease  Father 3    Social History Social History  Substance Use Topics  . Smoking status: Former Games developer  . Smokeless tobacco: Never Used     Comment: quit 25 years ago  . Alcohol Use: Yes     Comment: very rarely    Review of Systems Constitutional: No fever/chills Eyes: No visual changes. ENT: No sore throat. Cardiovascular: +chest pain. Respiratory: +shortness of breath. Gastrointestinal: No abdominal pain.  No nausea, no vomiting.  No diarrhea.  No constipation. Genitourinary: Negative for dysuria. Musculoskeletal: Negative for back pain. Skin: Negative for rash. Neurological: Negative for headaches, focal weakness or numbness.  10-point ROS otherwise negative.  ____________________________________________   PHYSICAL EXAM:  VITAL SIGNS: ED Triage Vitals  Enc Vitals Group     BP 06/24/15 1528 157/77 mmHg     Pulse Rate 06/24/15 1528 76     Resp 06/24/15 1528 18     Temp 06/24/15 1528 98.1 F (36.7 C)     Temp Source 06/24/15 1528 Oral     SpO2 06/24/15 1528 99 %     Weight 06/24/15 1528 230 lb (104.327 kg)     Height 06/24/15 1528  (1.626 m)     Head Cir --      Peak Flow --      Pain Score 06/24/15 1528 5     Pain Loc --      Pain Edu? --      Excl. in GC? --     Constitutional: Alert and oriented. Well appearing and in no acute distress. Eyes: Conjunctivae are normal. PERRL. EOMI. Head: Atraumatic. Nose: No congestion/rhinnorhea. Mouth/Throat: Mucous membranes are moist.  Oropharynx non-erythematous. Neck: No stridor.  Cardiovascular: Normal rate, regular rhythm. Grossly normal heart sounds.  Good peripheral circulation. Respiratory: Normal respiratory effort.  No retractions. Lungs CTAB. Gastrointestinal: Soft and nontender. No distention.  No CVA tenderness. Genitourinary: deferred Musculoskeletal: No lower extremity tenderness nor edema.  No joint effusions. Neurologic:  Normal speech and language. No gross focal neurologic deficits are  appreciated. Skin:  Skin is warm, dry and intact. No rash noted. Psychiatric: Mood and affect are normal. Speech and behavior are normal.  ____________________________________________   LABS (all labs ordered are listed, but only abnormal results are displayed)  Labs Reviewed  BASIC METABOLIC PANEL - Abnormal; Notable for the following:    Glucose, Bld 113 (*)    All other components within normal limits  TROPONIN I  CBC   ____________________________________________  EKG  ED ECG REPORT I,  Gayla Doss, the attending physician, personally viewed and interpreted this ECG.   Date: 06/24/2015  EKG Time: 15:38  Rate: 68  Rhythm: normal sinus rhythm  Axis: normal  Intervals:none  ST&T Change: No Acute ST elevation.  ____________________________________________  RADIOLOGY  CXR  IMPRESSION: No active cardiopulmonary disease.  ____________________________________________   PROCEDURES  Procedure(s) performed: None  Critical Care performed: No  ____________________________________________   INITIAL IMPRESSION / ASSESSMENT AND PLAN / ED COURSE  Pertinent labs & imaging results that were available during my care of the patient were reviewed by me and considered in my medical decision making (see chart for details).  Carnisha Feltz is a 75 y.o. female with history of coronary artery disease status post LAD stent in 2006, hypertension, hyperlipidemia, diabetes, paroxysmal atrial fibrillation prescribed Elliquis but has not been taking it because of "aches and pains it causes" presents for evaluation of chest tightness is now resolved. On exam, she is well-appearing and in no acute distress. Vital signs are stable, she is afebrile. Currently she is chest pain-free. EKG not consistent with acute ischemia. First troponin is negative however my concern is for possible ACS given her report that her symptoms today are similar to her prior heart attack. Clinical picture is not  consistent with ACS or acute aortic dissection. We'll give aspirin and admit to hospitalist for chest pain rule out.  ----------------------------------------- 5:33 PM on 06/24/2015 ----------------------------------------- Labs reviewed and are generally unremarkable. Initial troponin is negative. Chest x-ray clear. Patient had episode while in the ER of heart racing with heart rate noted to be in the 120s on the monitor, unable to capture EKG due to short duration of symptoms. No recurrent chest pain. Case discussed with the hospitalist, Dr. Elpidio Anis, for admission.  ____________________________________________   FINAL CLINICAL IMPRESSION(S) / ED DIAGNOSES  Final diagnoses:  Chest pain, unspecified chest pain type      Gayla Doss, MD 06/25/15 0030

## 2015-06-24 NOTE — H&P (Addendum)
[facility] - [place] at [facility]   PATIENT NAME: Gabriela Reed    MR#:  [id number]  DATE OF BIRTH:  1940/11/06  DATE OF ADMISSION:  06/24/2015  PRIMARY CARE PHYSICIAN: Wynona Dove, MD   REQUESTING/REFERRING PHYSICIAN: Dr. Inocencio Homes  CHIEF COMPLAINT:   Chief Complaint  Patient presents with  . Headache  . Chest Pain    HISTORY OF PRESENT ILLNESS:  Gabriela Reed  is a 75 y.o. female with a known history of CAD, hypertension, diabetes, atrial fibrillation returns to the emergency room with acute episode of chest and neck pain along with palpitations and feeling clammy. Happened while shopping. Lasted 15 minutes. No syncope. Patient has had similar episodes in the past but feels these are more frequent now since starting on Januvia for her diabetes. She has had 2 episodes of racing of her heart in the emergency room with telemetry showing no tachycardia. She had cardiac catheterization with intervention in 2004. Repeat catheterization in 2006 showed moderate LAD lesion. Patient had Holter in 2016 which caused her diagnosis of atrial fibrillation. She was placed on Eliquis but stopped using it due to muscle aches. Today her EKG shows nothing acute. Troponin is normal.  PAST MEDICAL HISTORY:   Past Medical History  Diagnosis Date  . Chicken pox   . Diabetes mellitus   . Glaucoma   . Heart disease   . Hyperlipidemia   . Hypertension   . Heart murmur   . Coronary artery disease   . Heart palpitations   . A-fib [facility])     PAST SURGICAL HISTORY:   Past Surgical History  Procedure Laterality Date  . Tonsillectomy and adenoidectomy  1949  . Coronary artery stent  2004  . Cardiac catheterization      [place] x1 stent    SOCIAL HISTORY:   Social History  Substance Use Topics  . Smoking status: Former Games developer  . Smokeless tobacco: Never Used     Comment: quit 25 years ago  . Alcohol Use: 0.0 oz/week    0 Standard drinks or equivalent  per week     Comment: very rarely    FAMILY HISTORY:   Family History  Problem Relation Age of Onset  . Hyperlipidemia Mother   . Hypertension Mother   . Hyperlipidemia Father   . Hypertension Father   . Heart disease Father 75    DRUG ALLERGIES:   Allergies  Allergen Reactions  . Niaspan [Niacin Er] Rash  . Lisinopril Cough    REVIEW OF SYSTEMS:   Review of Systems  Constitutional: Positive for malaise/fatigue. Negative for fever, chills and weight loss.  HENT: Negative for hearing loss and nosebleeds.   Eyes: Negative for blurred vision, double vision and pain.  Respiratory: Negative for cough, hemoptysis, sputum production, shortness of breath and wheezing.   Cardiovascular: Positive for chest pain and palpitations. Negative for orthopnea and leg swelling.  Gastrointestinal: Negative for nausea, vomiting, abdominal pain, diarrhea and constipation.  Genitourinary: Negative for dysuria and hematuria.  Musculoskeletal: Negative for myalgias, back pain and falls.  Skin: Negative for rash.  Neurological: Positive for tingling and weakness. Negative for dizziness, tremors, sensory change, speech change, focal weakness, seizures and headaches.  Endo/Heme/Allergies: Does not bruise/bleed easily.  Psychiatric/Behavioral: Negative for depression and memory loss. The patient is not nervous/anxious.     MEDICATIONS AT HOME:   Prior to Admission medications   Medication Sig Start Date End Date Taking? Authorizing Provider  aspirin EC 81 MG tablet  Take 81 mg by mouth at bedtime.    Yes Historical Provider, MD  atorvastatin (LIPITOR) 20 MG tablet Take 20 mg by mouth at bedtime.   Yes Historical Provider, MD  desonide (DESOWEN) 0.05 % cream Apply 1 application topically as needed. Patient taking differently: Apply 1 application topically as needed (for rosacea).  03/13/15  Yes Shelia Media, MD  hydrochlorothiazide (HYDRODIURIL) 25 MG tablet Take 1 tablet (25 mg total) by mouth  daily. 03/13/15  Yes Shelia Media, MD  metFORMIN (GLUCOPHAGE-XR) 500 MG 24 hr tablet Take 500-1,000 mg by mouth 2 (two) times daily. Pt takes one tablet in the morning and two at bedtime.   Yes Historical Provider, MD  metoprolol succinate (TOPROL-XL) 100 MG 24 hr tablet Take 1 tablet (100 mg total) by mouth daily. Take with or immediately following a meal. 11/27/14  Yes Vesta Mixer, MD  sitaGLIPtin (JANUVIA) 25 MG tablet Take 1 tablet (25 mg total) by mouth daily. 03/13/15  Yes Shelia Media, MD  Travoprost, BAK Free, (TRAVATAN) 0.004 % SOLN ophthalmic solution Place 1 drop into the left eye at bedtime.   Yes Historical Provider, MD      VITAL SIGNS:  Blood pressure 157/77, pulse 76, temperature 98.1 F (36.7 C), temperature source Oral, resp. rate 18, height  (1.626 m), weight 104.327 kg (230 lb), SpO2 99 %.  PHYSICAL EXAMINATION:  Physical Exam  GENERAL:  75 y.o.-year-old patient lying in the bed with no acute distress. Obese EYES: Pupils equal, round, reactive to light and accommodation. No scleral icterus. Extraocular muscles intact.  HEENT: Head atraumatic, normocephalic. Oropharynx and nasopharynx clear. No oropharyngeal erythema, moist oral mucosa  NECK:  Supple, no jugular venous distention. No thyroid enlargement, no tenderness.  LUNGS: Normal breath sounds bilaterally, no wheezing, rales, rhonchi. No use of accessory muscles of respiration.  CARDIOVASCULAR: S1, S2 normal. No murmurs, rubs, or gallops.  ABDOMEN: Soft, nontender, nondistended. Bowel sounds present. No organomegaly or mass.  EXTREMITIES: No pedal edema, cyanosis, or clubbing. + 2 pedal & radial pulses b/l.   NEUROLOGIC: Cranial nerves II through XII are intact. No focal Motor or sensory deficits appreciated b/l PSYCHIATRIC: The patient is alert and oriented x 3. Good affect.  SKIN: No obvious rash, lesion, or ulcer.   LABORATORY PANEL:   CBC  Recent Labs Lab 06/24/15 1541  WBC 6.0  HGB 14.1   HCT 42.4  PLT 162   ------------------------------------------------------------------------------------------------------------------  Chemistries   Recent Labs Lab 06/24/15 1541  NA 140  K 3.8  CL 102  CO2 26  GLUCOSE 113*  BUN 20  CREATININE 0.71  CALCIUM 9.7   ------------------------------------------------------------------------------------------------------------------  Cardiac Enzymes  Recent Labs Lab 06/24/15 1541  TROPONINI <0.03   ------------------------------------------------------------------------------------------------------------------  RADIOLOGY:  Dg Chest 2 View  06/24/2015  CLINICAL DATA:  Heart racing.  Chest pain and numbness in arms. EXAM: CHEST  2 VIEW COMPARISON:  None. FINDINGS: The heart size and mediastinal contours are within normal limits. Both lungs are clear. Spondylosis is identified within the thoracic spine. IMPRESSION: No active cardiopulmonary disease. Electronically Signed   By: Signa Kell M.D.   On: 06/24/2015 16:14     IMPRESSION AND PLAN:   * Chest pain with palpitations This is likely from episodes of atrial fibrillation. Admit with telemetry monitoring and get serial troponins. Patient has had recurrent episodes of this. Although likely from atrial fibrillation will need evaluation. We will get a stress test. May need Holter monitor discharge. Consult  cardiology. Continue aspirin and statin.  * Diabetes mellitus Continue home medications along with sliding scale insulin and diabetic diet  * Atrial fibrillation Continue rate control medications. Patient stopped her Eliquis due to muscle aches.  * Hypertension Continue home medications  * DVT prophylaxis with Lovenox   All the records are reviewed and case discussed with ED provider. Management plans discussed with the patient, family and they are in agreement.  CODE STATUS: FULL  TOTAL TIME TAKING CARE OF THIS PATIENT: 40 minutes.    Milagros Loll R M.D  on 06/24/2015 at 6:00 PM  Between 7am to 6pm - Pager - [phone]  After 6pm go to [email or web address] - password [email or web address]  [facility]  Office  [phone]  CC: Primary care physician; Wynona Dove, MD     Note: This dictation was prepared with Dragon dictation along with smaller phrase technology. Any transcriptional errors that result from this process are unintentional.

## 2015-06-25 ENCOUNTER — Encounter: Payer: Self-pay | Admitting: Radiology

## 2015-06-25 ENCOUNTER — Other Ambulatory Visit: Payer: Self-pay | Admitting: Cardiovascular Disease

## 2015-06-25 ENCOUNTER — Encounter (HOSPITAL_BASED_OUTPATIENT_CLINIC_OR_DEPARTMENT_OTHER): Payer: PPO

## 2015-06-25 DIAGNOSIS — I10 Essential (primary) hypertension: Secondary | ICD-10-CM | POA: Insufficient documentation

## 2015-06-25 DIAGNOSIS — R0789 Other chest pain: Secondary | ICD-10-CM

## 2015-06-25 DIAGNOSIS — I4891 Unspecified atrial fibrillation: Secondary | ICD-10-CM | POA: Insufficient documentation

## 2015-06-25 DIAGNOSIS — R002 Palpitations: Secondary | ICD-10-CM | POA: Diagnosis not present

## 2015-06-25 DIAGNOSIS — Z7189 Other specified counseling: Secondary | ICD-10-CM | POA: Insufficient documentation

## 2015-06-25 DIAGNOSIS — R079 Chest pain, unspecified: Secondary | ICD-10-CM

## 2015-06-25 LAB — NM MYOCAR MULTI W/SPECT W/WALL MOTION / EF
CHL CUP MPHR: 148 {beats}/min
CHL CUP NUCLEAR SSS: 0
CHL CUP STRESS STAGE 1 HR: 79 {beats}/min
CHL CUP STRESS STAGE 2 SPEED: 0 mph
CHL CUP STRESS STAGE 3 SPEED: 0 mph
CHL CUP STRESS STAGE 4 HR: 103 {beats}/min
CHL CUP STRESS STAGE 4 SPEED: 0 mph
CHL CUP STRESS STAGE 5 DBP: 49 mmHg
CHL CUP STRESS STAGE 5 SPEED: 0 mph
CHL CUP STRESS STAGE 6 SPEED: 0 mph
CSEPEDS: 0 s
CSEPEW: 1 METS
CSEPHR: 70 %
Exercise duration (min): 0 min
LVDIAVOL: 51 mL
LVSYSVOL: 22 mL
NUC STRESS TID: 0.86
Peak HR: 103 {beats}/min
Percent of predicted max HR: 69 %
Rest HR: 74 {beats}/min
SDS: 0
SRS: 2
Stage 2 Grade: 0 %
Stage 2 HR: 70 {beats}/min
Stage 3 Grade: 0 %
Stage 3 HR: 69 {beats}/min
Stage 4 Grade: 0 %
Stage 5 Grade: 0 %
Stage 5 HR: 98 {beats}/min
Stage 5 SBP: 179 mmHg
Stage 6 DBP: 53 mmHg
Stage 6 Grade: 0 %
Stage 6 HR: 96 {beats}/min
Stage 6 SBP: 174 mmHg

## 2015-06-25 LAB — GLUCOSE, CAPILLARY
GLUCOSE-CAPILLARY: 174 mg/dL — AB (ref 65–99)
GLUCOSE-CAPILLARY: 149 mg/dL — AB (ref 65–99)
Glucose-Capillary: 129 mg/dL — ABNORMAL HIGH (ref 65–99)

## 2015-06-25 LAB — TROPONIN I

## 2015-06-25 MED ORDER — METOPROLOL TARTRATE 50 MG PO TABS: 50.0000 mg | ORAL_TABLET | Freq: Two times a day (BID) | ORAL | Status: DC

## 2015-06-25 MED ORDER — REGADENOSON 0.4 MG/5ML IV SOLN
0.4000 mg | Freq: Once | INTRAVENOUS | Status: AC
Start: 2015-06-25 — End: 2015-06-25
  Administered 2015-06-25: 13:00:00 0.4 mg via INTRAVENOUS
  Filled 2015-06-25: qty 5

## 2015-06-25 MED ORDER — METOPROLOL TARTRATE 25 MG PO TABS: 25.0000 mg | ORAL_TABLET | Freq: Two times a day (BID) | ORAL | Status: DC

## 2015-06-25 MED ORDER — RIVAROXABAN 20 MG PO TABS: 20.0000 mg | ORAL_TABLET | Freq: Every day | ORAL | Status: DC

## 2015-06-25 MED ORDER — ENOXAPARIN SODIUM 40 MG/0.4ML ~~LOC~~ SOLN: 40.0000 mg | Freq: Two times a day (BID) | SUBCUTANEOUS | Status: DC

## 2015-06-25 MED ORDER — TECHNETIUM TC 99M SESTAMIBI - CARDIOLITE
10.0000 | Freq: Once | INTRAVENOUS | Status: AC | PRN
  Administered 2015-06-25: 12.51 via INTRAVENOUS

## 2015-06-25 MED ORDER — TECHNETIUM TC 99M SESTAMIBI - CARDIOLITE
31.7900 | Freq: Once | INTRAVENOUS | Status: AC | PRN
  Administered 2015-06-25: 13:00:00 31.79 via INTRAVENOUS

## 2015-06-25 NOTE — Progress Notes (Signed)
Anticoagulation Monitoring - Lovenox  Patient is a 75 yo female admitted with chest pain.  Patient with Lovenox 40 mg subq q24h for DVT prophylaxis. Patient's est CrCl~77 mL/min and BMI is 41.  Per anticoagulation policy, will transition patient to Lovenox 40 mg subq q12h based on CrCl >30 mL/min and BMI >40.   Pharmacy will continue to monitor per policy.  Clarisa Schools, PharmD Clinical Pharmacist 06/25/2015

## 2015-06-25 NOTE — Discharge Planning (Signed)
Pt IV removed.  DC papers given, explained and educated.  Told of suggested FU appts.  Told of scripts sent to [facility] pharm.  RN assessment adn VS revealed stability for DC to home.  Pt will be wheeled to front and family transporting home via car.

## 2015-06-25 NOTE — Discharge Summary (Addendum)
Gabriela Reed, is a 75 y.o. female  DOB 1941/04/29  MRN [id number].  Admission date:  06/24/2015  Admitting Physician  Milagros Loll, MD  Discharge Date:  06/25/2015   Primary MD  Wynona Dove, MD  Recommendations for primary care physician for things to follow:   Follow-up with primary doctor in 1 week  Admission Diagnosis  Chest pain [R07.9] Chest pain, unspecified chest pain type [R07.9]   Discharge Diagnosis  Chest pain [R07.9] Chest pain, unspecified chest pain type [R07.9]    Active Problems:   Chest pain   Atypical chest pain      Past Medical History  Diagnosis Date  . Chicken pox   . Diabetes mellitus   . Glaucoma   . Heart disease   . Hyperlipidemia   . Hypertension   . Heart murmur   . Coronary artery disease   . Heart palpitations   . A-fib [facility])     Past Surgical History  Procedure Laterality Date  . Tonsillectomy and adenoidectomy  1949  . Coronary artery stent  2004  . Cardiac catheterization      [place] x1 stent       History of present illness and  Hospital Course:     Kindly see H&P for history of present illness and admission details, please review complete Labs, Consult reports and Test reports for all details in brief  HPI  from the history and physical done on the day of admission  75 year old female patient with history of coronary artery disease, hypertension, diabetes mellitus, etc. fibrillation comes in because of chest pain and palpitations and feeling clammy. Admitted for chest pain, possible MI. Patient admitted for chest pain rule out.  Hospital Course   1.Chest pain: Patient troponins are negative 3. EKG unremarkable. Patient is going for Lexiscan stress test. If that is negative and she should be discharged home. She denies any chest pain. Today,   2. Non volvular atrial fibrillation: Rate controlled. Patient stopped  her Eliquis /secondary to muscle aches. She has rate in 50s. So I decreased the metoprolol dose to 50 mg twice a day instead of Toprol-XL. So metoprolol should be metoprolol tartrate 50 mg twice a day.pt can start Xarelto  As Dr.Gollan advised and se eif she tolerates.so Xarelto 20 mg po daily ,will take samples from Dr.Gollan's office and try, 3.DMII';continue Metformin and Januvia. 4.CAD;continue ASA,Statins  Discharge Condition: stable   Follow UP      Discharge Instructions  and  Discharge Medications   *follow up with PMD in one week Continue low sodium ,diabetic diet     Medication List    TAKE these medications        aspirin EC 81 MG tablet  Take 81 mg by mouth at bedtime.     atorvastatin 20 MG tablet  Commonly known as:  LIPITOR  Take 20 mg by mouth at bedtime.     desonide 0.05 % cream  Commonly known as:  DESOWEN  Apply 1 application topically as needed.     hydrochlorothiazide 25 MG tablet  Commonly known as:  HYDRODIURIL  Take 1 tablet (25 mg total) by mouth daily.     metFORMIN 500 MG 24 hr tablet  Commonly known as:  GLUCOPHAGE-XR  Take 500-1,000 mg by mouth 2 (two) times daily. Pt takes one tablet in the morning and two at bedtime.     metoprolol 50 MG tablet  Commonly known as:  LOPRESSOR  Take 1 tablet (50 mg  total) by mouth 2 (two) times daily.     metoprolol succinate 100 MG 24 hr tablet  Commonly known as:  TOPROL-XL  Take 1 tablet (100 mg total) by mouth daily. Take with or immediately following a meal.     sitaGLIPtin 25 MG tablet  Commonly known as:  JANUVIA  Take 1 tablet (25 mg total) by mouth daily.     Travoprost (BAK Free) 0.004 % Soln ophthalmic solution  Commonly known as:  TRAVATAN  Place 1 drop into the left eye at bedtime.          Diet and Activity recommendation: See Discharge Instructions above   Consults obtained ;cardio   Major procedures  and Radiology Reports - PLEASE review detailed and final reports for all details, in brief -     Dg Chest 2 View  06/24/2015  CLINICAL DATA:  Heart racing.  Chest pain and numbness in arms. EXAM: CHEST  2 VIEW COMPARISON:  None. FINDINGS: The heart size and mediastinal contours are within normal limits. Both lungs are clear. Spondylosis is identified within the thoracic spine. IMPRESSION: No active cardiopulmonary disease. Electronically Signed   By: Signa Kell M.D.   On: 06/24/2015 16:14    Micro Results    No results found for this or any previous visit (from the past 240 hour(s)).     Today   Subjective:   Gabriela Reed today has no headache,no chest abdominal pain,no new weakness tingling or numbness, feels much better wants to go home today.   Objective:   Blood pressure 125/81, pulse 57, temperature 97.7 F (36.5 C), temperature source Oral, resp. rate 18, height  (1.626 m), weight 109.135 kg (240 lb 9.6 oz), SpO2 100 %.  No intake or output data in the 24 hours ending 06/25/15 1016  Exam Awake Alert, Oriented x 3, No new F.N deficits, Normal affect [place].AT,PERRAL Supple Neck,No JVD, No cervical lymphadenopathy appriciated.  Symmetrical Chest wall movement, Good air movement bilaterally, CTAB RRR,No Gallops,Rubs or new Murmurs, No Parasternal Heave +ve B.Sounds, Abd Soft, Non tender, No organomegaly appriciated, No rebound -guarding or rigidity. No Cyanosis, Clubbing or edema, No new Rash or bruise  Data Review   CBC w Diff:  Lab Results  Component Value Date   WBC 6.0 06/24/2015   HGB 14.1 06/24/2015   HCT 42.4 06/24/2015   PLT 162 06/24/2015   LYMPHOPCT 39.0 09/11/2014   MONOPCT 7.7 09/11/2014   EOSPCT 2.8 09/11/2014   BASOPCT 0.4 09/11/2014    CMP:  Lab Results  Component Value Date   NA 140 06/24/2015   K 3.8 06/24/2015   CL 102 06/24/2015   CO2 26 06/24/2015   BUN 20 06/24/2015   CREATININE 0.71 06/24/2015   PROT 7.6 03/13/2015    ALBUMIN 4.4 03/13/2015   BILITOT 0.5 03/13/2015   ALKPHOS 65 03/13/2015   AST 32 03/13/2015   ALT 42* 03/13/2015  .stress test is negative.   Total Time in preparing paper work, data evaluation and todays exam - 35 minutes  Gabriela Reed M.D on 06/25/2015 at 10:16 AM    Note: This dictation was prepared with Dragon dictation along with smaller phrase technology. Any transcriptional errors that result from this process are unintentional.

## 2015-06-25 NOTE — Consult Note (Signed)
CARDIOLOGY CONSULT NOTE   Patient ID: Gabriela Reed MRN: [id number], DOB/AGE: Oct 18, 1940   Admit date: 06/24/2015 Date of Consult: 06/25/2015 Reason for Consult: Chest Pain Physician Requesting Consult: Dr. Elpidio Anis   Primary Physician: Wynona Dove, MD Primary Cardiologist: Dr. Elease Hashimoto  HPI: Gabriela Reed is a 75 y.o. female with past medical history of CAD (s/p DES to mid-LAD in 2004, cath in 2006 showing patient stent with moderate CAD in the distal LAD), PAF (not on anticoagulation), Type 2 DM, HTN, and HLD who presented to [facility] on 06/24/2015 for evaluation of chest pain.  The patient reports she developed palpitations and diaphoresis while out shopping with her daughter yesterday. The episode lasted less than 15 minutes but her daughter recommended she come to the ED for evaluation. She reported mild dyspnea and chest pain with the episode. Upon arriving to the ED, she was symptom free. She denies having any repeat symptoms overnight and "feels well" this morning.   On telemetry, she was noted to have episodes of atrial fibrillation with HR in the 110's overnight. Since 0500 she has been in NSR with HR in the 60's - 70's. Again, she denies having any symptoms overnight.   While admitted, her CBC was normal. Cyclic troponin values have been negative. BMET showed no electrolyte abnormalities. TSH checked in 08/2014 was slightly elevated at 4.58.  She is followed by Dr. Elease Hashimoto in clinic and was last seen in 01/2015. At that time she reported having body aches since starting Eliquis in 10/2014. Her stroke risk with the atrial fibrillation was reviewed with her and it was recommended she try stopping her statin to see if that was contributing to her body aches. She insisted on stopping her Eliquis at that time and has not been on it since.   Problem List Past Medical History  Diagnosis Date  . Chicken pox   . Diabetes mellitus   . Glaucoma   . Heart disease   . Hyperlipidemia    . Hypertension   . Heart murmur   . Coronary artery disease   . Heart palpitations   . A-fib [facility])     Past Surgical History  Procedure Laterality Date  . Tonsillectomy and adenoidectomy  1949  . Coronary artery stent  2004  . Cardiac catheterization      [place] x1 stent     Allergies Allergies  Allergen Reactions  . Niaspan [Niacin Er] Rash  . Lisinopril Cough    Inpatient Medications . aspirin EC  81 mg Oral Daily  . atorvastatin  20 mg Oral QHS  . enoxaparin (LOVENOX) injection  40 mg Subcutaneous Q12H  . hydrochlorothiazide  25 mg Oral Daily  . insulin aspart  0-5 Units Subcutaneous QHS  . insulin aspart  0-9 Units Subcutaneous TID WC  . latanoprost  1 drop Left Eye QHS  . metFORMIN  1,000 mg Oral QHS  . metFORMIN  500 mg Oral Q breakfast  . metoprolol tartrate  50 mg Oral BID  . sodium chloride flush  3 mL Intravenous Q12H  . sodium chloride flush  3 mL Intravenous Q12H    Family History Family History  Problem Relation Age of Onset  . Hyperlipidemia Mother   . Hypertension Mother   . Hyperlipidemia Father   . Hypertension Father   . Heart disease Father 87     Social History Social History   Social History  . Marital Status: Divorced    Spouse Name: N/A  . Number of Children:  N/A  . Years of Education: N/A   Occupational History  . Not on file.   Social History Main Topics  . Smoking status: Former Games developer  . Smokeless tobacco: Never Used     Comment: quit 25 years ago  . Alcohol Use: 0.0 oz/week    0 Standard drinks or equivalent per week     Comment: very rarely  . Drug Use: No  . Sexual Activity: Not on file   Other Topics Concern  . Not on file   Social History Narrative   Lives in [place]. Cat in home.      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  Cardiovascular:  No dyspnea on exertion, edema, orthopnea,  paroxysmal nocturnal dyspnea. Positive for palpitations and chest pain. Dermatological: No rash, lesions/masses Respiratory: No cough, Positive for dyspnea Urologic: No hematuria, dysuria Abdominal:   No nausea, vomiting, diarrhea, bright red blood per rectum, melena, or hematemesis Neurologic:  No visual changes, wkns, changes in mental status. All other systems reviewed and are otherwise negative except as noted above.  Physical Exam:  Blood pressure 125/81, pulse 57, temperature 97.7 F (36.5 C), temperature source Oral, resp. rate 18, height  (1.626 m), weight 240 lb 9.6 oz (109.135 kg), SpO2 100 %.  General: Pleasant, Caucasian female appearing in NAD Psych: Normal affect. Neuro: Alert and oriented X 3. Moves all extremities spontaneously. HEENT: Normal  Neck: Supple without bruits or JVD. Lungs:  Resp regular and unlabored, CTA without wheezing or rales. Heart: RRR no s3, s4, or murmurs. Abdomen: Soft, non-tender, non-distended, BS + x 4.  Extremities: No clubbing, cyanosis or edema. DP/PT/Radials 2+ and equal bilaterally.  Labs:  Recent Labs  06/24/15 1541 06/24/15 2008 06/25/15 0441  TROPONINI <0.03 <0.03 <0.03   Lab Results  Component Value Date   WBC 6.0 06/24/2015   HGB 14.1 06/24/2015   HCT 42.4 06/24/2015   MCV 87.2 06/24/2015   PLT 162 06/24/2015    Recent Labs Lab 06/24/15 1541  NA 140  K 3.8  CL 102  CO2 26  BUN 20  CREATININE 0.71  CALCIUM 9.7  GLUCOSE 113*   Lab Results  Component Value Date   CHOL 162 03/13/2015   HDL 49.00 03/13/2015   LDLCALC 88 03/13/2015   TRIG 125.0 03/13/2015    Radiology/Studies  Dg Chest 2 View: 06/24/2015  CLINICAL DATA:  Heart racing.  Chest pain and numbness in arms. EXAM: CHEST  2 VIEW COMPARISON:  None. FINDINGS: The heart size and mediastinal contours are within normal limits. Both lungs are clear. Spondylosis is identified within the thoracic spine. IMPRESSION: No active cardiopulmonary disease.  Electronically Signed   By: Signa Kell M.D.   On: 06/24/2015 16:14    ECG: NSR, HR 69, No acute ST or T-wave changes.  ECHOCARDIOGRAM: 10/06/2014 Study Conclusions - Left ventricle: The cavity size was normal. There was mild concentric hypertrophy. Systolic function was normal. Wall motion was normal; there were no regional wall motion abnormalities. Doppler parameters are consistent with abnormal left ventricular relaxation (grade 1 diastolic dysfunction). - Aortic valve: Transvalvular velocity was increased. There was very mild stenosis. - Mitral valve: Calcified annulus. There was mild regurgitation. - Right ventricle: Systolic function was normal. - Pulmonary arteries: Systolic pressure was high normal. PA peak pressure: 33 mm Hg (S).  ASSESSMENT AND PLAN  1. Paroxysmal Atrial Fibrillation - initially diagnosed in 10/2014.  - This patients CHA2DS2-VASc Score and unadjusted Ischemic Stroke Rate (% per year) is equal to 9.7 % stroke rate/year from a score of 6 (CHF, HTN, DM, Vascular, Age, Female). Stopped taking her Eliquis in 01/2015 due to "body aches" she experienced after starting the medication. Dr. Elease Hashimoto advocated against this due to her stroke risk but she insisted on stopping it. Today, her stroke risk was again reviewed with her and alternatives to Eliquis were discussed. She reports "all those medications make me feel weird" and does not wish to be on a blood-thinner at this time. Says she wishes to remain on ASA  alone. She was informed this is not the same as full anticoagulation, but still does not wish to try an alternative to Eliquis. - On telemetry, she was noted to have episodes of atrial fibrillation with HR in the 110's overnight. Since 0500 she has been in NSR with HR in the 50's - 70's. Again, she denies having any symptoms overnight.  - with episodes of bradycardia when in NSR, may benefit from decreasing Toprol-XL from  to  daily (would  have to take one  tablet and one  tablet since  dose is not available.)  - would also recheck TSH (could be done as outpatient if going home this afternoon).  2. Chest Pain - history of CAD s/p DES to mid-LAD in 2004, cath in 2006 showing patient stent with moderate CAD in the distal LAD - occurred in the setting of palpitations and atrial fibrillation. - currently resolved. No repeat episodes.  - Cyclic troponin values have been negative. EKG shows no acute ischemic changes. - seen in Nuclear Medicine for 1-day NST. Official results pending.  3. HTN - BP has been 118/62 - 157/105. - continue to monitor  4. HLD - continue statin therapy.  5. Type 2 DM - per admitting team   Signed, Ellsworth Lennox, PA-C 06/25/2015, 11:01 AM Pager: [phone]

## 2015-06-25 NOTE — Care Management Obs Status (Signed)
MEDICARE OBSERVATION STATUS NOTIFICATION   Patient Details  Name: Gabriela Reed MRN: [id number] Date of Birth: 14-Jul-1940   Medicare Observation Status Notification Given:  Yes, declines to sign at this time    Gwenette Greet, RN 06/25/2015, 11:32 AM

## 2015-06-25 NOTE — Plan of Care (Signed)
Problem: Consults Goal: Chest Pain Patient Education (See Patient Education module for education specifics.) Outcome: Progressing Pt likes to be called Gabriela Reed Pt lives alone at home    Past Medical History   Diagnosis  Date   .  Chicken pox     .  Diabetes mellitus     .  Glaucoma     .  Heart disease     .  Hyperlipidemia     .  Hypertension     .  Heart murmur     .  Coronary artery disease     .  Heart palpitations     .  A-fib (HCC            Pt is well controlled medicated with home medications.     Problem: Phase II Progression Outcomes Goal: Stress Test if indicated Outcome: Progressing No c/o chest pain nor distress noted, stress test in am. SR to AFib on monitor. NPO since midnight for procedure. Continue to monitor.

## 2015-06-26 ENCOUNTER — Telehealth: Payer: Self-pay

## 2015-06-26 NOTE — Telephone Encounter (Signed)
Attempted to contact pt regarding discharge from [facility] on 06/26/15. Left detailed message on pt's vm asking her to call back w/ any questions or concerns regarding discharge instructions and/or medications. Advised her of appt w/ Eula Listen, PA on 07/08/15 @ 8:00am. Asked her to call back if she will be unable to keep this appt.

## 2015-06-26 NOTE — Telephone Encounter (Addendum)
Patient following up with Cardiologist after hospital discharge.  She states she will follow up with PCP as needed.

## 2015-06-26 NOTE — Telephone Encounter (Signed)
-----   Message from Coralee Rud sent at 06/26/2015  9:40 AM EST ----- Regarding: tcm/ph 07/08/2015 Eula Listen, PA-C 8am

## 2015-07-01 ENCOUNTER — Telehealth: Payer: Self-pay | Admitting: Internal Medicine

## 2015-07-01 NOTE — Telephone Encounter (Signed)
lmtcb to schedule AWV with Denisa/msn

## 2015-07-08 ENCOUNTER — Ambulatory Visit (INDEPENDENT_AMBULATORY_CARE_PROVIDER_SITE_OTHER): Payer: PPO | Admitting: Physician Assistant

## 2015-07-08 ENCOUNTER — Encounter: Payer: Self-pay | Admitting: Physician Assistant

## 2015-07-08 VITALS — BP 130/80 | HR 84 | Ht 64.0 in | Wt 237.2 lb

## 2015-07-08 DIAGNOSIS — I10 Essential (primary) hypertension: Secondary | ICD-10-CM

## 2015-07-08 DIAGNOSIS — I2510 Atherosclerotic heart disease of native coronary artery without angina pectoris: Secondary | ICD-10-CM | POA: Diagnosis not present

## 2015-07-08 DIAGNOSIS — I480 Paroxysmal atrial fibrillation: Secondary | ICD-10-CM | POA: Diagnosis not present

## 2015-07-08 DIAGNOSIS — E785 Hyperlipidemia, unspecified: Secondary | ICD-10-CM | POA: Diagnosis not present

## 2015-07-08 DIAGNOSIS — R079 Chest pain, unspecified: Secondary | ICD-10-CM

## 2015-07-08 MED ORDER — RIVAROXABAN 20 MG PO TABS: 20.0000 mg | ORAL_TABLET | Freq: Every day | ORAL | Status: DC

## 2015-07-08 NOTE — Progress Notes (Signed)
Cardiology Office Note Date:  07/08/2015  Patient ID:  Gabriela Reed, Gabriela Reed 16-Feb-1941, MRN [id number] PCP:  Wynona Dove, MD  Cardiologist:  Dr. Elease Hashimoto, MD    Chief Complaint: Hospital follow up for Afib with RVR and chest pain  History of Present Illness: Gabriela Reed is a 75 y.o. female with history of CAD (s/p DES to mid-LAD in 2004, cath in 2006 showing patient stent with moderate CAD in the distal LAD), PAF (previously not on full-dose anticoagulation), type 2 DM, HTN, and HLD who presents for hospital follow up for chest pain. She was last seen in clinic by Dr. Elease Hashimoto 01/2015 and reported myalgias since starting Eliquis in 10/2014. Her stroke risk with the atrial fibrillation was reviewed with her and it was recommended she try stopping her statin to see if that was contributing to her body aches. She insisted on stopping her Eliquis at that time. She presented to [facility] on 1/25 with palpitations and chest pain while out shopping. Symptoms lasted 15 minutes. She was found to be in Afib with RVR on telemetry with HR in the 110's overnight, but since converted to sinus on her own with HR in the 60-70's. Cardiac enzymes were negative. Most recent TSH from 08/2014 was mildly elevated at 4.58. CXR was negative. ECG was non-acute. She underwent Lexiscan Myoview on 1/26 that showed no significant ischemia, normal wall motion, EF 83%, no ECG changes concerning for ischemia, low risk scan. She was ultimately started on Xarelto given her stroke risk and continued on her current medications with the possibility of adding flecainide in the future.   She has felt well since her discharge from [facility]. No further chest pain. She did have a brief episode of tachy-palpitations x 1 lasting approximately 5 minutes before self resolving. No associated symptoms. Tolerating medications without issues. Doing well with Xarelto so far, requests samples today.     Past Medical History  Diagnosis Date  . Chicken  pox   . DM (diabetes mellitus), type 2 (HCC)   . Glaucoma   . Hyperlipidemia   . Hypertension   . Heart murmur   . Coronary artery disease     a. s/p DES to mid-LAD in 2004, cath in 2006 showing patient stent with moderate CAD in the distal LAD  . Heart palpitations   . PAF (paroxysmal atrial fibrillation) (HCC)     a. diagnosed in 10/2014 b. quit taking Eliquis in 01/2015 due to myalgias. Refuses to be on anticoagulation    Past Surgical History  Procedure Laterality Date  . Tonsillectomy and adenoidectomy  1949  . Coronary artery stent  2004  . Cardiac catheterization      [place] x1 stent    Current Outpatient Prescriptions  Medication Sig Dispense Refill  . atorvastatin (LIPITOR) 20 MG tablet Take 20 mg by mouth at bedtime.    Marland Kitchen desonide (DESOWEN) 0.05 % cream Apply 1 application topically as needed. (Patient taking differently: Apply 1 application topically as needed (for rosacea). ) 60 g 3  . hydrochlorothiazide (HYDRODIURIL) 25 MG tablet Take 1 tablet (25 mg total) by mouth daily. 90 tablet 1  . metFORMIN (GLUCOPHAGE-XR) 500 MG 24 hr tablet Take 500-1,000 mg by mouth 2 (two) times daily. Pt takes one tablet in the morning and two at bedtime.    . metoprolol succinate (TOPROL-XL) 100 MG 24 hr tablet Take 1 tablet (100 mg total) by mouth daily. Take with or immediately following a meal. 90 tablet 3  . rivaroxaban (  XARELTO) 20 MG TABS tablet Take 1 tablet (20 mg total) by mouth daily. 30 tablet 3  . sitaGLIPtin (JANUVIA) 25 MG tablet Take 1 tablet (25 mg total) by mouth daily. 30 tablet 3   No current facility-administered medications for this visit.    Allergies:   Niaspan and Lisinopril   Social History:  The patient  reports that she has quit smoking. She has never used smokeless tobacco. She reports that she drinks alcohol. She reports that she does not use illicit drugs.   Family History:  The patient's family history includes Heart attack in her father; Heart disease  (age of onset: 54) in her father; Hyperlipidemia in her father and mother; Hypertension in her father and mother.  ROS:   Review of Systems  Constitutional: Negative for fever, chills, weight loss, malaise/fatigue and diaphoresis.  HENT: Negative for congestion.   Eyes: Negative for discharge and redness.  Respiratory: Negative for cough, hemoptysis, sputum production, shortness of breath and wheezing.   Cardiovascular: Negative for chest pain, palpitations, orthopnea, claudication, leg swelling and PND.  Gastrointestinal: Negative for heartburn, nausea, vomiting, abdominal pain, blood in stool and melena.  Genitourinary: Negative for hematuria.  Musculoskeletal: Negative for myalgias and falls.  Skin: Negative for rash.  Neurological: Negative for dizziness, sensory change, speech change, focal weakness, loss of consciousness and weakness.  Endo/Heme/Allergies: Does not bruise/bleed easily.  Psychiatric/Behavioral: The patient is not nervous/anxious.   All other systems reviewed and are negative.     PHYSICAL EXAM:  VS:  BP 130/80 mmHg  Pulse 84  Ht  (1.626 m)  Wt 237 lb 4 oz (107.616 kg)  BMI 40.70 kg/m2 BMI: Body mass index is 40.7 kg/(m^2). Well nourished, well developed, in no acute distress HEENT: normocephalic, atraumatic Neck: no JVD, carotid bruits or masses Cardiac:  normal S1, S2; RRR; no murmurs, rubs, or gallops Lungs:  clear to auscultation bilaterally, no wheezing, rhonchi or rales Abd: morbidly obese, soft, nontender, no hepatomegaly, + BS MS: no deformity or atrophy Ext: no edema Skin: warm and dry, no rash Neuro:  moves all extremities spontaneously, no focal abnormalities noted, follows commands Psych: euthymic mood, full affect   EKG:  Was ordered today. Shows NSR, 84 bpm, TWI aVL (old)  Recent Labs: 09/11/2014: TSH 4.58* 03/13/2015: ALT 42* 06/24/2015: BUN 20; Creatinine, Ser 0.71; Hemoglobin 14.1; Platelets 162; Potassium 3.8; Sodium 140    03/13/2015: Cholesterol 162; HDL 49.00; LDL Cholesterol 88; Total CHOL/HDL Ratio 3; Triglycerides 125.0; VLDL 25.0   Estimated Creatinine Clearance: 76.2 mL/min (by C-G formula based on Cr of 0.71).   Wt Readings from Last 3 Encounters:  07/08/15 237 lb 4 oz (107.616 kg)  06/25/15 240 lb 9.6 oz (109.135 kg)  04/16/15 238 lb 4 oz (108.069 kg)     Other studies reviewed: Additional studies/records reviewed today include: summarized above  ASSESSMENT AND PLAN:  1. PAF: -She remains in sinus rhythm with a heart rate in the 80's today -Continue Toprol XL 100 mg daily -Doing well with Xarelto 20 mg q dinner at this time, requests sample, these were supplied today -CHADS2VASc at least 5 (HTN, age x 1, DM, vascular disease, sex category) -Should she develop increased Afib burden could consider flecainide, though given her sensitivity to medications, may need to be cautious with starting this medication  2. CAD s/p PCI as above: -Currently without symptoms of angina -No on aspirin at this time given she is on Xarelto as above -Continue Toprol XL as above -  Lipitor 20 mg qhs -No further ischemic work up at this time  3. HTN: -Well controlled -Continue current treatment  4. HLD: -Lipitor as above  Disposition: F/u with Dr. Mariah Milling, MD in 3 months  Current medicines are reviewed at length with the patient today.  The patient did not have any concerns regarding medicines.  Elinor Dodge PA-C 07/08/2015 8:40 AM     [facility] [address] [place], [place] [address] [phone]

## 2015-07-08 NOTE — Patient Instructions (Signed)
Medication Instructions:  Your physician recommends that you continue on your current medications as directed. Please refer to the Current Medication list given to you today.   Labwork: CBC, BMET  Testing/Procedures: none  Follow-Up: Your physician recommends that you schedule a follow-up appointment in: 3 months with Dr. Mariah Milling.    Any Other Special Instructions Will Be Listed Below (If Applicable).     If you need a refill on your cardiac medications before your next appointment, please call your pharmacy.

## 2015-07-09 LAB — CBC
HEMOGLOBIN: 14.2 g/dL (ref 11.1–15.9)
Hematocrit: 41.4 % (ref 34.0–46.6)
MCH: 28.8 pg (ref 26.6–33.0)
MCHC: 34.3 g/dL (ref 31.5–35.7)
MCV: 84 fL (ref 79–97)
PLATELETS: 183 10*3/uL (ref 150–379)
RBC: 4.93 x10E6/uL (ref 3.77–5.28)
RDW: 13 % (ref 12.3–15.4)
WBC: 4.6 10*3/uL (ref 3.4–10.8)

## 2015-07-09 LAB — BASIC METABOLIC PANEL
BUN/Creatinine Ratio: 24 (ref 11–26)
BUN: 22 mg/dL (ref 8–27)
CALCIUM: 9.4 mg/dL (ref 8.7–10.3)
CHLORIDE: 97 mmol/L (ref 96–106)
CO2: 19 mmol/L (ref 18–29)
CREATININE: 0.9 mg/dL (ref 0.57–1.00)
GFR calc non Af Amer: 64 mL/min/{1.73_m2} (ref >59)
GFR, EST AFRICAN AMERICAN: 74 mL/min/{1.73_m2} (ref >59)
Glucose: 135 mg/dL — ABNORMAL HIGH (ref 65–99)
Potassium: 3.7 mmol/L (ref 3.5–5.2)
Sodium: 140 mmol/L (ref 134–144)

## 2015-07-29 ENCOUNTER — Other Ambulatory Visit: Payer: Self-pay | Admitting: Internal Medicine

## 2015-07-29 NOTE — Telephone Encounter (Signed)
Pt called to check the status of her refill for sitaGLIPtin (JANUVIA) 25 MG tablet. Pharmacy is [facility] [address] [place], [place] - [address]. Call pt @ [phone]. Thank you!

## 2015-08-03 DIAGNOSIS — M9905 Segmental and somatic dysfunction of pelvic region: Secondary | ICD-10-CM | POA: Diagnosis not present

## 2015-08-03 DIAGNOSIS — M5432 Sciatica, left side: Secondary | ICD-10-CM | POA: Diagnosis not present

## 2015-08-03 DIAGNOSIS — M9903 Segmental and somatic dysfunction of lumbar region: Secondary | ICD-10-CM | POA: Diagnosis not present

## 2015-08-03 DIAGNOSIS — M955 Acquired deformity of pelvis: Secondary | ICD-10-CM | POA: Diagnosis not present

## 2015-08-06 DIAGNOSIS — M9903 Segmental and somatic dysfunction of lumbar region: Secondary | ICD-10-CM | POA: Diagnosis not present

## 2015-08-06 DIAGNOSIS — M955 Acquired deformity of pelvis: Secondary | ICD-10-CM | POA: Diagnosis not present

## 2015-08-06 DIAGNOSIS — M5432 Sciatica, left side: Secondary | ICD-10-CM | POA: Diagnosis not present

## 2015-08-06 DIAGNOSIS — M9905 Segmental and somatic dysfunction of pelvic region: Secondary | ICD-10-CM | POA: Diagnosis not present

## 2015-08-07 DIAGNOSIS — M955 Acquired deformity of pelvis: Secondary | ICD-10-CM | POA: Diagnosis not present

## 2015-08-07 DIAGNOSIS — M9903 Segmental and somatic dysfunction of lumbar region: Secondary | ICD-10-CM | POA: Diagnosis not present

## 2015-08-07 DIAGNOSIS — M9905 Segmental and somatic dysfunction of pelvic region: Secondary | ICD-10-CM | POA: Diagnosis not present

## 2015-08-07 DIAGNOSIS — M5432 Sciatica, left side: Secondary | ICD-10-CM | POA: Diagnosis not present

## 2015-08-10 DIAGNOSIS — M5432 Sciatica, left side: Secondary | ICD-10-CM | POA: Diagnosis not present

## 2015-08-10 DIAGNOSIS — M9905 Segmental and somatic dysfunction of pelvic region: Secondary | ICD-10-CM | POA: Diagnosis not present

## 2015-08-10 DIAGNOSIS — M955 Acquired deformity of pelvis: Secondary | ICD-10-CM | POA: Diagnosis not present

## 2015-08-10 DIAGNOSIS — M9903 Segmental and somatic dysfunction of lumbar region: Secondary | ICD-10-CM | POA: Diagnosis not present

## 2015-08-12 DIAGNOSIS — M955 Acquired deformity of pelvis: Secondary | ICD-10-CM | POA: Diagnosis not present

## 2015-08-12 DIAGNOSIS — M9903 Segmental and somatic dysfunction of lumbar region: Secondary | ICD-10-CM | POA: Diagnosis not present

## 2015-08-12 DIAGNOSIS — M5432 Sciatica, left side: Secondary | ICD-10-CM | POA: Diagnosis not present

## 2015-08-12 DIAGNOSIS — M9905 Segmental and somatic dysfunction of pelvic region: Secondary | ICD-10-CM | POA: Diagnosis not present

## 2015-08-14 DIAGNOSIS — M5432 Sciatica, left side: Secondary | ICD-10-CM | POA: Diagnosis not present

## 2015-08-14 DIAGNOSIS — M9905 Segmental and somatic dysfunction of pelvic region: Secondary | ICD-10-CM | POA: Diagnosis not present

## 2015-08-14 DIAGNOSIS — M9903 Segmental and somatic dysfunction of lumbar region: Secondary | ICD-10-CM | POA: Diagnosis not present

## 2015-08-14 DIAGNOSIS — M955 Acquired deformity of pelvis: Secondary | ICD-10-CM | POA: Diagnosis not present

## 2015-08-17 DIAGNOSIS — M9905 Segmental and somatic dysfunction of pelvic region: Secondary | ICD-10-CM | POA: Diagnosis not present

## 2015-08-17 DIAGNOSIS — M9903 Segmental and somatic dysfunction of lumbar region: Secondary | ICD-10-CM | POA: Diagnosis not present

## 2015-08-17 DIAGNOSIS — M955 Acquired deformity of pelvis: Secondary | ICD-10-CM | POA: Diagnosis not present

## 2015-08-17 DIAGNOSIS — M5432 Sciatica, left side: Secondary | ICD-10-CM | POA: Diagnosis not present

## 2015-08-19 DIAGNOSIS — M9903 Segmental and somatic dysfunction of lumbar region: Secondary | ICD-10-CM | POA: Diagnosis not present

## 2015-08-19 DIAGNOSIS — M5432 Sciatica, left side: Secondary | ICD-10-CM | POA: Diagnosis not present

## 2015-08-19 DIAGNOSIS — M955 Acquired deformity of pelvis: Secondary | ICD-10-CM | POA: Diagnosis not present

## 2015-08-19 DIAGNOSIS — M9905 Segmental and somatic dysfunction of pelvic region: Secondary | ICD-10-CM | POA: Diagnosis not present

## 2015-08-20 DIAGNOSIS — M9903 Segmental and somatic dysfunction of lumbar region: Secondary | ICD-10-CM | POA: Diagnosis not present

## 2015-08-20 DIAGNOSIS — M9905 Segmental and somatic dysfunction of pelvic region: Secondary | ICD-10-CM | POA: Diagnosis not present

## 2015-08-20 DIAGNOSIS — M955 Acquired deformity of pelvis: Secondary | ICD-10-CM | POA: Diagnosis not present

## 2015-08-20 DIAGNOSIS — M5432 Sciatica, left side: Secondary | ICD-10-CM | POA: Diagnosis not present

## 2015-08-24 ENCOUNTER — Ambulatory Visit: Payer: Self-pay | Admitting: Cardiology

## 2015-08-24 DIAGNOSIS — M5432 Sciatica, left side: Secondary | ICD-10-CM | POA: Diagnosis not present

## 2015-08-24 DIAGNOSIS — M9905 Segmental and somatic dysfunction of pelvic region: Secondary | ICD-10-CM | POA: Diagnosis not present

## 2015-08-24 DIAGNOSIS — M9903 Segmental and somatic dysfunction of lumbar region: Secondary | ICD-10-CM | POA: Diagnosis not present

## 2015-08-24 DIAGNOSIS — M955 Acquired deformity of pelvis: Secondary | ICD-10-CM | POA: Diagnosis not present

## 2015-08-26 DIAGNOSIS — M9903 Segmental and somatic dysfunction of lumbar region: Secondary | ICD-10-CM | POA: Diagnosis not present

## 2015-08-26 DIAGNOSIS — M5432 Sciatica, left side: Secondary | ICD-10-CM | POA: Diagnosis not present

## 2015-08-26 DIAGNOSIS — M9905 Segmental and somatic dysfunction of pelvic region: Secondary | ICD-10-CM | POA: Diagnosis not present

## 2015-08-26 DIAGNOSIS — M955 Acquired deformity of pelvis: Secondary | ICD-10-CM | POA: Diagnosis not present

## 2015-08-27 DIAGNOSIS — M5432 Sciatica, left side: Secondary | ICD-10-CM | POA: Diagnosis not present

## 2015-08-27 DIAGNOSIS — M9905 Segmental and somatic dysfunction of pelvic region: Secondary | ICD-10-CM | POA: Diagnosis not present

## 2015-08-27 DIAGNOSIS — M955 Acquired deformity of pelvis: Secondary | ICD-10-CM | POA: Diagnosis not present

## 2015-08-27 DIAGNOSIS — M9903 Segmental and somatic dysfunction of lumbar region: Secondary | ICD-10-CM | POA: Diagnosis not present

## 2015-09-01 DIAGNOSIS — M955 Acquired deformity of pelvis: Secondary | ICD-10-CM | POA: Diagnosis not present

## 2015-09-01 DIAGNOSIS — M9903 Segmental and somatic dysfunction of lumbar region: Secondary | ICD-10-CM | POA: Diagnosis not present

## 2015-09-01 DIAGNOSIS — M5432 Sciatica, left side: Secondary | ICD-10-CM | POA: Diagnosis not present

## 2015-09-01 DIAGNOSIS — M9905 Segmental and somatic dysfunction of pelvic region: Secondary | ICD-10-CM | POA: Diagnosis not present

## 2015-09-03 DIAGNOSIS — M955 Acquired deformity of pelvis: Secondary | ICD-10-CM | POA: Diagnosis not present

## 2015-09-03 DIAGNOSIS — M9905 Segmental and somatic dysfunction of pelvic region: Secondary | ICD-10-CM | POA: Diagnosis not present

## 2015-09-03 DIAGNOSIS — M9903 Segmental and somatic dysfunction of lumbar region: Secondary | ICD-10-CM | POA: Diagnosis not present

## 2015-09-03 DIAGNOSIS — M5432 Sciatica, left side: Secondary | ICD-10-CM | POA: Diagnosis not present

## 2015-09-04 ENCOUNTER — Ambulatory Visit (INDEPENDENT_AMBULATORY_CARE_PROVIDER_SITE_OTHER): Payer: PPO | Admitting: Internal Medicine

## 2015-09-04 ENCOUNTER — Encounter: Payer: Self-pay | Admitting: Internal Medicine

## 2015-09-04 VITALS — BP 135/77 | HR 74 | Temp 98.2°F | Ht 64.0 in | Wt 234.2 lb

## 2015-09-04 DIAGNOSIS — E118 Type 2 diabetes mellitus with unspecified complications: Secondary | ICD-10-CM

## 2015-09-04 DIAGNOSIS — I10 Essential (primary) hypertension: Secondary | ICD-10-CM

## 2015-09-04 DIAGNOSIS — I480 Paroxysmal atrial fibrillation: Secondary | ICD-10-CM

## 2015-09-04 LAB — COMPREHENSIVE METABOLIC PANEL
ALK PHOS: 57 U/L (ref 39–117)
ALT: 56 U/L — AB (ref 0–35)
AST: 46 U/L — ABNORMAL HIGH (ref 0–37)
Albumin: 4.7 g/dL (ref 3.5–5.2)
BILIRUBIN TOTAL: 0.8 mg/dL (ref 0.2–1.2)
BUN: 16 mg/dL (ref 6–23)
CO2: 29 mEq/L (ref 19–32)
CREATININE: 0.77 mg/dL (ref 0.40–1.20)
Calcium: 10.3 mg/dL (ref 8.4–10.5)
Chloride: 100 mEq/L (ref 96–112)
GFR: 78.17 mL/min (ref >60.00)
GLUCOSE: 126 mg/dL — AB (ref 70–99)
Potassium: 3.8 mEq/L (ref 3.5–5.1)
SODIUM: 139 meq/L (ref 135–145)
Total Protein: 7.9 g/dL (ref 6.0–8.3)

## 2015-09-04 LAB — MICROALBUMIN / CREATININE URINE RATIO
Creatinine,U: 93.9 mg/dL
MICROALB/CREAT RATIO: 1.6 mg/g (ref 0.0–30.0)
Microalb, Ur: 1.5 mg/dL (ref 0.0–1.9)

## 2015-09-04 LAB — LIPID PANEL
CHOL/HDL RATIO: 3
Cholesterol: 134 mg/dL (ref 0–200)
HDL: 44.6 mg/dL (ref >39.00)
LDL Cholesterol: 66 mg/dL (ref 0–99)
NONHDL: 89.65
Triglycerides: 116 mg/dL (ref 0.0–149.0)
VLDL: 23.2 mg/dL (ref 0.0–40.0)

## 2015-09-04 LAB — HEMOGLOBIN A1C: Hgb A1c MFr Bld: 7.4 % — ABNORMAL HIGH (ref 4.6–6.5)

## 2015-09-04 MED ORDER — HYDROCHLOROTHIAZIDE 25 MG PO TABS: 25.0000 mg | ORAL_TABLET | Freq: Every day | ORAL | Status: DC

## 2015-09-04 MED ORDER — ACCU-CHEK SOFTCLIX LANCETS MISC: Status: DC

## 2015-09-04 MED ORDER — METFORMIN HCL ER 500 MG PO TB24: 500.0000 mg | ORAL_TABLET | Freq: Two times a day (BID) | ORAL | Status: DC

## 2015-09-04 MED ORDER — ATORVASTATIN CALCIUM 20 MG PO TABS: 20.0000 mg | ORAL_TABLET | Freq: Every day | ORAL | Status: DC

## 2015-09-04 MED ORDER — GLUCOSE BLOOD VI STRP: 1.0000 | ORAL_STRIP | Status: DC | PRN

## 2015-09-04 NOTE — Progress Notes (Signed)
Pre visit review using our clinic review tool, if applicable. No additional management support is needed unless otherwise documented below in the visit note. 

## 2015-09-04 NOTE — Assessment & Plan Note (Signed)
NSR on exam today. Rate controlled with Metoprolol. Anticoagulated with Xarelto. Will continue current medication.

## 2015-09-04 NOTE — Assessment & Plan Note (Signed)
BP Readings from Last 3 Encounters:  09/04/15 135/77  07/08/15 130/80  06/25/15 158/68   BP well controlled. Renal function with labs.

## 2015-09-04 NOTE — Assessment & Plan Note (Signed)
Wt Readings from Last 3 Encounters:  09/04/15 234 lb 4 oz (106.255 kg)  07/08/15 237 lb 4 oz (107.616 kg)  06/25/15 240 lb 9.6 oz (109.135 kg)   Congratulated pt on weight loss. Encouraged continued effort at healthy diet and exercise.

## 2015-09-04 NOTE — Progress Notes (Signed)
Subjective:    Patient ID: Gabriela Reed, female    DOB: 02-25-1941, 75 y.o.   MRN: [id number]  HPI  74YO female presents for follow up.  DM- BG running near 130s. Improved compared to previous. No low BG. Compliant with medication.  Had some recent leg and foot pain which precluded exercise. Now back exercising with stationary bike, zumba.  No recent palpitations, chest pain. Compliant with medication.    Wt Readings from Last 3 Encounters:  09/04/15 234 lb 4 oz (106.255 kg)  07/08/15 237 lb 4 oz (107.616 kg)  06/25/15 240 lb 9.6 oz (109.135 kg)   BP Readings from Last 3 Encounters:  09/04/15 135/77  07/08/15 130/80  06/25/15 158/68    Past Medical History  Diagnosis Date  . Chicken pox   . DM (diabetes mellitus), type 2 (HCC)   . Glaucoma   . Hyperlipidemia   . Hypertension   . Heart murmur   . Coronary artery disease     a. s/p DES to mid-LAD in 2004, cath in 2006 showing patient stent with moderate CAD in the distal LAD  . Heart palpitations   . PAF (paroxysmal atrial fibrillation) (HCC)     a. diagnosed in 10/2014 b. quit taking Eliquis in 01/2015 due to myalgias. Refuses to be on anticoagulation   Family History  Problem Relation Age of Onset  . Hyperlipidemia Mother   . Hypertension Mother   . Hyperlipidemia Father   . Hypertension Father   . Heart disease Father 13  . Heart attack Father    Past Surgical History  Procedure Laterality Date  . Tonsillectomy and adenoidectomy  1949  . Coronary artery stent  2004  . Cardiac catheterization      [place] x1 stent   Social History   Social History  . Marital Status: Divorced    Spouse Name: N/A  . Number of Children: N/A  . Years of Education: N/A   Social History Main Topics  . Smoking status: Former Games developer  . Smokeless tobacco: Never Used     Comment: quit 25 years ago  . Alcohol Use: 0.0 oz/week    0 Standard drinks or equivalent per week     Comment: very rarely  . Drug Use: No  .  Sexual Activity: Not Asked   Other Topics Concern  . None   Social History Narrative   Lives in [place]. Cat in home.      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  Eyes: Negative for visual disturbance.  Respiratory: Negative for cough, chest tightness and shortness of breath.   Cardiovascular: Negative for chest pain, palpitations and leg swelling.  Gastrointestinal: Negative for nausea, vomiting, abdominal pain, diarrhea and constipation.  Musculoskeletal: Negative for myalgias and arthralgias.  Skin: Negative for color change and rash.  Hematological: Negative for adenopathy. Does not bruise/bleed easily.  Psychiatric/Behavioral: Negative for sleep disturbance and dysphoric mood. The patient is not nervous/anxious.        Objective:    BP 135/77 mmHg  Pulse 74  Temp(Src) 98.2 F (36.8 C) (Oral)  Ht  (1.626 m)  Wt 234 lb 4 oz (106.255 kg)  BMI 40.19 kg/m2  SpO2 99% Physical Exam  Constitutional: She is oriented to person, place, and time. She appears well-developed and well-nourished. No distress.  HENT:  Head: Normocephalic and atraumatic.  Right Ear: External ear normal.  Left Ear: External ear normal.  Nose: Nose normal.  Mouth/Throat: Oropharynx is clear and moist. No oropharyngeal exudate.  Eyes: Conjunctivae are normal. Pupils are equal, round, and reactive to light. Right eye exhibits no discharge. Left eye exhibits no discharge. No scleral icterus.  Neck: Normal range of motion. Neck supple. No tracheal deviation present. No thyromegaly present.  Cardiovascular: Normal rate, regular rhythm, normal heart sounds and intact distal pulses.  Exam reveals no gallop and no friction rub.   No murmur heard. Pulmonary/Chest: Effort normal and breath sounds normal. No respiratory distress. She has no wheezes. She has no  rales. She exhibits no tenderness.  Musculoskeletal: Normal range of motion. She exhibits no edema or tenderness.  Lymphadenopathy:    She has no cervical adenopathy.  Neurological: She is alert and oriented to person, place, and time. No cranial nerve deficit. She exhibits normal muscle tone. Coordination normal.  Skin: Skin is warm and dry. No rash noted. She is not diaphoretic. No erythema. No pallor.  Psychiatric: She has a normal mood and affect. Her behavior is normal. Judgment and thought content normal.          Assessment & Plan:   Problem List Items Addressed This Visit      Unprioritized   Atrial fibrillation (HCC)    NSR on exam today. Rate controlled with Metoprolol. Anticoagulated with Xarelto. Will continue current medication.      Relevant Medications   atorvastatin (LIPITOR) 20 MG tablet   hydrochlorothiazide (HYDRODIURIL) 25 MG tablet   Diabetes mellitus type 2, controlled (HCC) - Primary    BG improved by report. Will check A1c with labs. Continue current medication.      Relevant Medications   atorvastatin (LIPITOR) 20 MG tablet   metFORMIN (GLUCOPHAGE-XR) 500 MG 24 hr tablet   Other Relevant Orders   Comprehensive metabolic panel   Hemoglobin A1c   Lipid panel   Microalbumin / creatinine urine ratio   Hypertension    BP Readings from Last 3 Encounters:  09/04/15 135/77  07/08/15 130/80  06/25/15 158/68   BP well controlled. Renal function with labs.      Relevant Medications   atorvastatin (LIPITOR) 20 MG tablet   hydrochlorothiazide (HYDRODIURIL) 25 MG tablet   Morbid obesity (HCC)    Wt Readings from Last 3 Encounters:  09/04/15 234 lb 4 oz (106.255 kg)  07/08/15 237 lb 4 oz (107.616 kg)  06/25/15 240 lb 9.6 oz (109.135 kg)   Congratulated pt on weight loss. Encouraged continued effort at healthy diet and exercise.      Relevant Medications   metFORMIN (GLUCOPHAGE-XR) 500 MG 24 hr tablet       Return in about 6 months (around  03/05/2016) for Recheck.  Ronna PolioJennifer Walker, MD Internal Medicine [facility]

## 2015-09-04 NOTE — Patient Instructions (Signed)
Labs today.  Follow up 6 months. 

## 2015-09-04 NOTE — Assessment & Plan Note (Signed)
BG improved by report. Will check A1c with labs. Continue current medication.

## 2015-09-07 ENCOUNTER — Other Ambulatory Visit: Payer: Self-pay

## 2015-09-07 ENCOUNTER — Encounter: Payer: Self-pay | Admitting: *Deleted

## 2015-09-07 MED ORDER — METOPROLOL SUCCINATE ER 100 MG PO TB24: 100.0000 mg | ORAL_TABLET | Freq: Every day | ORAL | Status: DC

## 2015-09-07 MED ORDER — GLUCOSE BLOOD VI STRP: 1.0000 | ORAL_STRIP | Status: DC | PRN

## 2015-09-07 MED ORDER — ACCU-CHEK SOFTCLIX LANCETS MISC: Status: DC

## 2015-09-07 MED ORDER — HYDROCHLOROTHIAZIDE 25 MG PO TABS: 25.0000 mg | ORAL_TABLET | Freq: Every day | ORAL | Status: DC

## 2015-09-07 NOTE — Telephone Encounter (Signed)
Patient walked in and requested refills, completed as requested. Thanks

## 2015-09-08 DIAGNOSIS — M9905 Segmental and somatic dysfunction of pelvic region: Secondary | ICD-10-CM | POA: Diagnosis not present

## 2015-09-08 DIAGNOSIS — M9903 Segmental and somatic dysfunction of lumbar region: Secondary | ICD-10-CM | POA: Diagnosis not present

## 2015-09-08 DIAGNOSIS — M955 Acquired deformity of pelvis: Secondary | ICD-10-CM | POA: Diagnosis not present

## 2015-09-08 DIAGNOSIS — M5432 Sciatica, left side: Secondary | ICD-10-CM | POA: Diagnosis not present

## 2015-09-10 ENCOUNTER — Ambulatory Visit: Payer: Self-pay | Admitting: Internal Medicine

## 2015-09-14 DIAGNOSIS — M955 Acquired deformity of pelvis: Secondary | ICD-10-CM | POA: Diagnosis not present

## 2015-09-14 DIAGNOSIS — M9903 Segmental and somatic dysfunction of lumbar region: Secondary | ICD-10-CM | POA: Diagnosis not present

## 2015-09-14 DIAGNOSIS — M5432 Sciatica, left side: Secondary | ICD-10-CM | POA: Diagnosis not present

## 2015-09-14 DIAGNOSIS — M9905 Segmental and somatic dysfunction of pelvic region: Secondary | ICD-10-CM | POA: Diagnosis not present

## 2015-09-17 DIAGNOSIS — H401132 Primary open-angle glaucoma, bilateral, moderate stage: Secondary | ICD-10-CM | POA: Diagnosis not present

## 2015-09-23 DIAGNOSIS — H401492 Capsular glaucoma with pseudoexfoliation of lens, unspecified eye, moderate stage: Secondary | ICD-10-CM | POA: Diagnosis not present

## 2015-09-28 DIAGNOSIS — M5432 Sciatica, left side: Secondary | ICD-10-CM | POA: Diagnosis not present

## 2015-09-28 DIAGNOSIS — M9903 Segmental and somatic dysfunction of lumbar region: Secondary | ICD-10-CM | POA: Diagnosis not present

## 2015-09-28 DIAGNOSIS — M955 Acquired deformity of pelvis: Secondary | ICD-10-CM | POA: Diagnosis not present

## 2015-09-28 DIAGNOSIS — M9905 Segmental and somatic dysfunction of pelvic region: Secondary | ICD-10-CM | POA: Diagnosis not present

## 2015-10-02 ENCOUNTER — Other Ambulatory Visit: Payer: Self-pay

## 2015-10-02 ENCOUNTER — Ambulatory Visit: Payer: Self-pay | Admitting: Internal Medicine

## 2015-10-09 ENCOUNTER — Encounter: Payer: Self-pay | Admitting: Cardiovascular Disease

## 2015-10-09 ENCOUNTER — Ambulatory Visit (INDEPENDENT_AMBULATORY_CARE_PROVIDER_SITE_OTHER): Payer: PPO | Admitting: Cardiovascular Disease

## 2015-10-09 VITALS — BP 130/68 | HR 70 | Ht 64.0 in | Wt 234.2 lb

## 2015-10-09 DIAGNOSIS — E785 Hyperlipidemia, unspecified: Secondary | ICD-10-CM

## 2015-10-09 DIAGNOSIS — I4891 Unspecified atrial fibrillation: Secondary | ICD-10-CM | POA: Diagnosis not present

## 2015-10-09 DIAGNOSIS — E1159 Type 2 diabetes mellitus with other circulatory complications: Secondary | ICD-10-CM | POA: Diagnosis not present

## 2015-10-09 DIAGNOSIS — I2510 Atherosclerotic heart disease of native coronary artery without angina pectoris: Secondary | ICD-10-CM | POA: Diagnosis not present

## 2015-10-09 DIAGNOSIS — I10 Essential (primary) hypertension: Secondary | ICD-10-CM

## 2015-10-09 NOTE — Assessment & Plan Note (Signed)
Blood pressure is well controlled on today's visit. No changes made to the medications. 

## 2015-10-09 NOTE — Assessment & Plan Note (Signed)
She is hoping to, off some of her diabetes medications Hemoglobin A1c well above goal given her coronary disease Recommended continued weight loss, exercise, strict diet. She likes to eat bread

## 2015-10-09 NOTE — Assessment & Plan Note (Signed)
We have encouraged continued exercise, careful diet management in an effort to lose weight. 

## 2015-10-09 NOTE — Patient Instructions (Signed)
You are doing well.  Please call if you have worsening shortness of breath or chest pain, Also please call if you have more frequent episodes of atrial fibrillation  Ok to cut the metoprolol if tinnitus is better with lower dose   Please call us if you have new issues that need to be addressed before your next appt.  Your physician wants you to follow-up in: 12 months.  You will receive a reminder letter in the mail two months in advance. If you don't receive a letter, please call our office to schedule the follow-up appointment.

## 2015-10-09 NOTE — Assessment & Plan Note (Signed)
Paroxysmal atrial fibrillation She is concerned metoprolol is causing tinnitus Also read that the xarelto could cause tinnitus These are not very common side effects She does not want to change xarelto Suggested she could decrease the dose of her metoprolol down to 50 mg daily to see if symptoms improve. Potentially could use calcium channel blocker for rhythm control if needed

## 2015-10-09 NOTE — Assessment & Plan Note (Signed)
Tolerating low-dose Lipitor Cholesterol is at goal on the current lipid regimen. No changes to the medications were made.    Total encounter time more than 25 minutes  Greater than 50% was spent in counseling and coordination of care with the patient

## 2015-10-09 NOTE — Assessment & Plan Note (Signed)
Currently with no symptoms of angina. No further workup at this time. Continue current medication regimen. 

## 2015-10-09 NOTE — Progress Notes (Signed)
Patient ID: Gabriela Reed, female    DOB: [birth year], 75 y.o.   MRN: [id number]  HPI Comments: Gabriela Reed is a 75 y.o.  Woman with history of morbid obesity, paroxysmal atrial fibrillation, coronary artery disease with stent to the LAD in 2004, presenting for routine follow-up of her coronary artery disease and PAF   Notes reviewed from previous visits indicating she felt she is having side effects on eliquis.  Was having body ache, joint ache, headaches.  Now she is taking Xarelto.  Today reports that she has worsening tinnitus and blames the combination of her medications.  She is unsure whether this is metoprolol, xarelto  Or her diabetes medications.  Wonders if she can stop the mall and start " fresh"   Reports she is slowly losing weight, trying to exercise more  Atrial fibrillation episodes are rare typically lasting 5 sometimes up to 15 minutes at a time.  Resolve on their own without intervention.   Prior stress test 06/25/2015 showing no ischemia She presented to the hospital January 26 with chest pain, paroxysmal atrial fibrillation The daytime was not on anticoagulation. Long discussion, she agreed to restart anticoagulation  EKG on today's visit shows normal sinus rhythm with rate 70 bpm, no significant ST or T-wave changes  Other past medical history reviewed November 27, 2014:  event monitor and was found to have atrial fibrillation .      Allergies  Allergen Reactions  . Niaspan [Niacin Er] Rash  . Lisinopril Cough    Current Outpatient Prescriptions on File Prior to Visit  Medication Sig Dispense Refill  . ACCU-CHEK SOFTCLIX LANCETS lancets Use as instructed with Accu Check One Touch Ultra Mini glucometer to test blood sugars twice daily.  Dx E11.9 100 each 5  . atorvastatin (LIPITOR) 20 MG tablet Take 1 tablet (20 mg total) by mouth at bedtime. 90 tablet 3  . desonide (DESOWEN) 0.05 % cream Apply 1 application topically as needed. (Patient taking differently:  Apply 1 application topically as needed (for rosacea). ) 60 g 3  . glucose blood test strip 1 each by Other route as needed for other. Use as instructed with Accu Check One Touch Ultra Mini glucometer to test blood sugars twice daily.  Dx E11.9 100 each 5  . hydrochlorothiazide (HYDRODIURIL) 25 MG tablet Take 1 tablet (25 mg total) by mouth daily. 90 tablet 3  . JANUVIA 25 MG tablet TAKE ONE TABLET BY MOUTH ONCE DAILY 30 tablet 5  . metFORMIN (GLUCOPHAGE-XR) 500 MG 24 hr tablet Take 1-2 tablets (500-1,000 mg total) by mouth 2 (two) times daily. Pt takes one tablet in the morning and two at bedtime. 180 tablet 1  . metoprolol succinate (TOPROL-XL) 100 MG 24 hr tablet Take 1 tablet (100 mg total) by mouth daily. Take with or immediately following a meal. 90 tablet 3  . rivaroxaban (XARELTO) 20 MG TABS tablet Take 1 tablet (20 mg total) by mouth daily. 30 tablet 3   No current facility-administered medications on file prior to visit.    Past Medical History  Diagnosis Date  . Chicken pox   . DM (diabetes mellitus), type 2 (HCC)   . Glaucoma   . Hyperlipidemia   . Hypertension   . Heart murmur   . Coronary artery disease     a. s/p DES to mid-LAD in 2004, cath in 2006 showing patient stent with moderate CAD in the distal LAD  . Heart palpitations   . PAF (paroxysmal atrial fibrillation) (  HCC)     a. diagnosed in 10/2014 b. quit taking Eliquis in 01/2015 due to myalgias. Refuses to be on anticoagulation    Past Surgical History  Procedure Laterality Date  . Tonsillectomy and adenoidectomy  1949  . Coronary artery stent  2004  . Cardiac catheterization      [place] x1 stent    Social History  reports that she has quit smoking. She has never used smokeless tobacco. She reports that she drinks alcohol. She reports that she does not use illicit drugs.  Family History family history includes Heart attack in her father; Heart disease (age of onset: 2365) in her father; Hyperlipidemia in  her father and mother; Hypertension in her father and mother.   Review of Systems  Constitutional: Positive for fatigue.  HENT: Positive for tinnitus.   Eyes: Negative.   Respiratory: Negative.   Cardiovascular: Negative.   Gastrointestinal: Negative.   Musculoskeletal: Positive for myalgias and arthralgias.  Neurological: Negative.   Hematological: Negative.   Psychiatric/Behavioral: Negative.   All other systems reviewed and are negative.   BP 130/68 mmHg  Pulse 70  Ht 5\' 4"  (1.626 m)  Wt 234 lb 4 oz (106.255 kg)  BMI 40.19 kg/m2  Physical Exam  Constitutional: She is oriented to person, place, and time. She appears well-developed and well-nourished.  Obese  HENT:  Head: Normocephalic.  Nose: Nose normal.  Mouth/Throat: Oropharynx is clear and moist.  Eyes: Conjunctivae are normal. Pupils are equal, round, and reactive to light.  Neck: Normal range of motion. Neck supple. No JVD present.  Cardiovascular: Normal rate, regular rhythm, normal heart sounds and intact distal pulses.  Exam reveals no gallop and no friction rub.   No murmur heard. Pulmonary/Chest: Effort normal and breath sounds normal. No respiratory distress. She has no wheezes. She has no rales. She exhibits no tenderness.  Abdominal: Soft. Bowel sounds are normal. She exhibits no distension. There is no tenderness.  Musculoskeletal: Normal range of motion. She exhibits no edema or tenderness.  Lymphadenopathy:    She has no cervical adenopathy.  Neurological: She is alert and oriented to person, place, and time. Coordination normal.  Skin: Skin is warm and dry. No rash noted. No erythema.  Psychiatric: She has a normal mood and affect. Her behavior is normal. Judgment and thought content normal.

## 2015-10-19 DIAGNOSIS — M5432 Sciatica, left side: Secondary | ICD-10-CM | POA: Diagnosis not present

## 2015-10-19 DIAGNOSIS — M955 Acquired deformity of pelvis: Secondary | ICD-10-CM | POA: Diagnosis not present

## 2015-10-19 DIAGNOSIS — M9905 Segmental and somatic dysfunction of pelvic region: Secondary | ICD-10-CM | POA: Diagnosis not present

## 2015-10-19 DIAGNOSIS — M9903 Segmental and somatic dysfunction of lumbar region: Secondary | ICD-10-CM | POA: Diagnosis not present

## 2015-11-09 DIAGNOSIS — M955 Acquired deformity of pelvis: Secondary | ICD-10-CM | POA: Diagnosis not present

## 2015-11-09 DIAGNOSIS — M9903 Segmental and somatic dysfunction of lumbar region: Secondary | ICD-10-CM | POA: Diagnosis not present

## 2015-11-09 DIAGNOSIS — M9905 Segmental and somatic dysfunction of pelvic region: Secondary | ICD-10-CM | POA: Diagnosis not present

## 2015-11-09 DIAGNOSIS — M5432 Sciatica, left side: Secondary | ICD-10-CM | POA: Diagnosis not present

## 2015-12-02 DIAGNOSIS — M955 Acquired deformity of pelvis: Secondary | ICD-10-CM | POA: Diagnosis not present

## 2015-12-02 DIAGNOSIS — M9903 Segmental and somatic dysfunction of lumbar region: Secondary | ICD-10-CM | POA: Diagnosis not present

## 2015-12-02 DIAGNOSIS — M9905 Segmental and somatic dysfunction of pelvic region: Secondary | ICD-10-CM | POA: Diagnosis not present

## 2015-12-02 DIAGNOSIS — M5432 Sciatica, left side: Secondary | ICD-10-CM | POA: Diagnosis not present

## 2015-12-23 DIAGNOSIS — M5432 Sciatica, left side: Secondary | ICD-10-CM | POA: Diagnosis not present

## 2015-12-23 DIAGNOSIS — M955 Acquired deformity of pelvis: Secondary | ICD-10-CM | POA: Diagnosis not present

## 2015-12-23 DIAGNOSIS — M9903 Segmental and somatic dysfunction of lumbar region: Secondary | ICD-10-CM | POA: Diagnosis not present

## 2015-12-23 DIAGNOSIS — M9905 Segmental and somatic dysfunction of pelvic region: Secondary | ICD-10-CM | POA: Diagnosis not present

## 2015-12-29 ENCOUNTER — Telehealth: Payer: Self-pay

## 2015-12-29 NOTE — Telephone Encounter (Signed)
Notified patient samples of xarelto 20 mg placed at the front desk. Xarelto 20 mg Lot# [id number] Exp. 8-19

## 2015-12-29 NOTE — Telephone Encounter (Signed)
Pt would like Xarelto samples. 20 mg 

## 2016-01-13 DIAGNOSIS — M9903 Segmental and somatic dysfunction of lumbar region: Secondary | ICD-10-CM | POA: Diagnosis not present

## 2016-01-13 DIAGNOSIS — M9905 Segmental and somatic dysfunction of pelvic region: Secondary | ICD-10-CM | POA: Diagnosis not present

## 2016-01-13 DIAGNOSIS — M5432 Sciatica, left side: Secondary | ICD-10-CM | POA: Diagnosis not present

## 2016-01-13 DIAGNOSIS — M955 Acquired deformity of pelvis: Secondary | ICD-10-CM | POA: Diagnosis not present

## 2016-01-26 DIAGNOSIS — H401492 Capsular glaucoma with pseudoexfoliation of lens, unspecified eye, moderate stage: Secondary | ICD-10-CM | POA: Diagnosis not present

## 2016-02-10 DIAGNOSIS — M5432 Sciatica, left side: Secondary | ICD-10-CM | POA: Diagnosis not present

## 2016-02-10 DIAGNOSIS — M9903 Segmental and somatic dysfunction of lumbar region: Secondary | ICD-10-CM | POA: Diagnosis not present

## 2016-02-10 DIAGNOSIS — M9905 Segmental and somatic dysfunction of pelvic region: Secondary | ICD-10-CM | POA: Diagnosis not present

## 2016-02-10 DIAGNOSIS — M955 Acquired deformity of pelvis: Secondary | ICD-10-CM | POA: Diagnosis not present

## 2016-02-29 ENCOUNTER — Ambulatory Visit: Payer: Self-pay | Admitting: Internal Medicine

## 2016-03-02 ENCOUNTER — Ambulatory Visit: Payer: Self-pay | Admitting: Internal Medicine

## 2016-03-02 DIAGNOSIS — H401492 Capsular glaucoma with pseudoexfoliation of lens, unspecified eye, moderate stage: Secondary | ICD-10-CM | POA: Diagnosis not present

## 2016-03-04 ENCOUNTER — Other Ambulatory Visit: Payer: Self-pay

## 2016-03-04 MED ORDER — METFORMIN HCL ER 500 MG PO TB24: 500.0000 mg | ORAL_TABLET | Freq: Two times a day (BID) | ORAL | 0 refills | Status: DC

## 2016-03-07 ENCOUNTER — Encounter: Payer: Self-pay | Admitting: Family

## 2016-03-07 ENCOUNTER — Ambulatory Visit: Payer: Self-pay | Admitting: Internal Medicine

## 2016-03-07 ENCOUNTER — Ambulatory Visit (INDEPENDENT_AMBULATORY_CARE_PROVIDER_SITE_OTHER): Payer: PPO | Admitting: Family

## 2016-03-07 ENCOUNTER — Telehealth: Payer: Self-pay | Admitting: Family

## 2016-03-07 VITALS — BP 144/78 | HR 73 | Temp 97.8°F | Ht 64.0 in | Wt 234.2 lb

## 2016-03-07 DIAGNOSIS — Z13820 Encounter for screening for osteoporosis: Secondary | ICD-10-CM

## 2016-03-07 DIAGNOSIS — E1159 Type 2 diabetes mellitus with other circulatory complications: Secondary | ICD-10-CM

## 2016-03-07 DIAGNOSIS — I10 Essential (primary) hypertension: Secondary | ICD-10-CM | POA: Diagnosis not present

## 2016-03-07 LAB — COMPREHENSIVE METABOLIC PANEL
ALBUMIN: 4.3 g/dL (ref 3.5–5.2)
ALK PHOS: 64 U/L (ref 39–117)
ALT: 44 U/L — AB (ref 0–35)
AST: 30 U/L (ref 0–37)
BILIRUBIN TOTAL: 0.6 mg/dL (ref 0.2–1.2)
BUN: 15 mg/dL (ref 6–23)
CALCIUM: 9.9 mg/dL (ref 8.4–10.5)
CO2: 28 mEq/L (ref 19–32)
Chloride: 102 mEq/L (ref 96–112)
Creatinine, Ser: 0.7 mg/dL (ref 0.40–1.20)
GFR: 87.13 mL/min (ref >60.00)
GLUCOSE: 157 mg/dL — AB (ref 70–99)
POTASSIUM: 4.4 meq/L (ref 3.5–5.1)
Sodium: 139 mEq/L (ref 135–145)
TOTAL PROTEIN: 7.3 g/dL (ref 6.0–8.3)

## 2016-03-07 LAB — HEMOGLOBIN A1C: HEMOGLOBIN A1C: 7.2 % — AB (ref 4.6–6.5)

## 2016-03-07 MED ORDER — METFORMIN HCL ER 500 MG PO TB24: 500.0000 mg | ORAL_TABLET | Freq: Two times a day (BID) | ORAL | 0 refills | Status: DC

## 2016-03-07 MED ORDER — ACCU-CHEK SOFTCLIX LANCETS MISC: 5 refills | Status: DC

## 2016-03-07 MED ORDER — METOPROLOL SUCCINATE ER 100 MG PO TB24: 100.0000 mg | ORAL_TABLET | Freq: Every day | ORAL | 3 refills | Status: DC

## 2016-03-07 MED ORDER — GLUCOSE BLOOD VI STRP: 1.0000 | ORAL_STRIP | 5 refills | Status: DC | PRN

## 2016-03-07 NOTE — Telephone Encounter (Signed)
Ok to do so

## 2016-03-07 NOTE — Telephone Encounter (Signed)
Pt called stating that the pharmacy has not filled metFORMIN (GLUCOPHAGE-XR) 500 MG 24 hr tablet because the directions were on wrong. They should read take 1 tablet in the morning and 2 tablets at night.  Please advise, thank you!  Pharmacy - [facility] [address] - [place], [place] - [id number]  Call Pt @ [phone]

## 2016-03-07 NOTE — Assessment & Plan Note (Signed)
Stable. Continues to follow with cardiology, Dr. Mariah MillingGollan. Will continue to follow current regimen.

## 2016-03-07 NOTE — Assessment & Plan Note (Signed)
Pending A1c. Last A1c 7.4 Patient prefers to be on metformin only and requested a referral to diabetic nutritionist. She states she has done well with metformin and diet controlled diabetes in the past. We'll discontinue Januvia. Will continue to follow

## 2016-03-07 NOTE — Progress Notes (Signed)
Pre visit review using our clinic review tool, if applicable. No additional management support is needed unless otherwise documented below in the visit note. 

## 2016-03-07 NOTE — Patient Instructions (Signed)
Pleasure meeting you.  Basic Carbohydrate Counting for Diabetes Mellitus Carbohydrate counting is a method for keeping track of the amount of carbohydrates you eat. Eating carbohydrates naturally increases the level of sugar (glucose) in your blood, so it is important for you to know the amount that is okay for you to have in every meal. Carbohydrate counting helps keep the level of glucose in your blood within normal limits. The amount of carbohydrates allowed is different for every person. A dietitian can help you calculate the amount that is right for you. Once you know the amount of carbohydrates you can have, you can count the carbohydrates in the foods you want to eat. Carbohydrates are found in the following foods:  Grains, such as breads and cereals.  Dried beans and soy products.  Starchy vegetables, such as potatoes, peas, and corn.  Fruit and fruit juices.  Milk and yogurt.  Sweets and snack foods, such as cake, cookies, candy, chips, soft drinks, and fruit drinks. CARBOHYDRATE COUNTING There are two ways to count the carbohydrates in your food. You can use either of the methods or a combination of both. Reading the "Nutrition Facts" on Packaged Food The "Nutrition Facts" is an area that is included on the labels of almost all packaged food and beverages in the [place]. It includes the serving size of that food or beverage and information about the nutrients in each serving of the food, including the grams (g) of carbohydrate per serving.  Decide the number of servings of this food or beverage that you will be able to eat or drink. Multiply that number of servings by the number of grams of carbohydrate that is listed on the label for that serving. The total will be the amount of carbohydrates you will be having when you eat or drink this food or beverage. Learning Standard Serving Sizes of Food When you eat food that is not packaged or does not include "Nutrition Facts" on the  label, you need to measure the servings in order to count the amount of carbohydrates.A serving of most carbohydrate-rich foods contains about 15 g of carbohydrates. The following list includes serving sizes of carbohydrate-rich foods that provide 15 g ofcarbohydrate per serving:   1 slice of bread (1 oz) or 1 six-inch tortilla.    of a hamburger bun or English muffin.  4-6 crackers.   cup unsweetened dry cereal.    cup hot cereal.   cup rice or pasta.    cup mashed potatoes or  of a large baked potato.  1 cup fresh fruit or one small piece of fruit.    cup canned or frozen fruit or fruit juice.  1 cup milk.   cup plain fat-free yogurt or yogurt sweetened with artificial sweeteners.   cup cooked dried beans or starchy vegetable, such as peas, corn, or potatoes.  Decide the number of standard-size servings that you will eat. Multiply that number of servings by 15 (the grams of carbohydrates in that serving). For example, if you eat 2 cups of strawberries, you will have eaten 2 servings and 30 g of carbohydrates (2 servings x 15 g = 30 g). For foods such as soups and casseroles, in which more than one food is mixed in, you will need to count the carbohydrates in each food that is included. EXAMPLE OF CARBOHYDRATE COUNTING Sample Dinner  3 oz chicken breast.   cup of brown rice.   cup of corn.  1 cup milk.     1 cup strawberries with sugar-free whipped topping.  Carbohydrate Calculation Step 1: Identify the foods that contain carbohydrates:   Rice.   Corn.   Milk.   Strawberries. Step 2:Calculate the number of servings eaten of each:   2 servings of rice.   1 serving of corn.   1 serving of milk.   1 serving of strawberries. Step 3: Multiply each of those number of servings by 15 g:   2 servings of rice x 15 g = 30 g.   1 serving of corn x 15 g = 15 g.   1 serving of milk x 15 g = 15 g.   1 serving of strawberries x 15 g = 15  g. Step 4: Add together all of the amounts to find the total grams of carbohydrates eaten: 30 g + 15 g + 15 g + 15 g = 75 g.   This information is not intended to replace advice given to you by your health care provider. Make sure you discuss any questions you have with your health care provider.   Document Released: 05/16/2005 Document Revised: 06/06/2014 Document Reviewed: 04/12/2013 Elsevier Interactive Patient Education 2016 [employer] Inc.  

## 2016-03-07 NOTE — Assessment & Plan Note (Signed)
Stable. Controlled at last visit. We'll continue to monitor and draw lipids at subsequent visit.

## 2016-03-07 NOTE — Assessment & Plan Note (Addendum)
Stable. We'll continue to monitor. Pending CMP.

## 2016-03-07 NOTE — Telephone Encounter (Signed)
Okay to refill as patient takes it.   She is taking less than max total day dose of 2000mg /day.

## 2016-03-07 NOTE — Progress Notes (Signed)
Subjective:    Patient ID: Gabriela MessJudith O'Reilly, female    DOB: 03/17/1941, 75 y.o.   MRN: [id number]  CC: Gabriela MessJudith O'Reilly is a 75 y.o. female who presents today for follow up.   HPI: Patient here for follow-up on diabetes.  DM- Diarrhea from [place] and has stopped medication. Would like to see a nutritionist. Hasn't been exercising or eat well. Averaging 160's. She is on metformin in the past has been able to control her diabetes with diet in the metformin.  HTN- Takes BP at home, usually runs SBP 130's. Follows with Dr. Mariah MillingGollan. Denies exertional chest pain or pressure, numbness or tingling radiating to left arm or jaw, palpitations, dizziness, frequent headaches, changes in vision, or shortness of breath.   Declines mammogram and Pap smear testing at her age.  Ordered DEXA scan.          HISTORY:  Past Medical History:  Diagnosis Date  . Chicken pox   . Coronary artery disease    a. s/p DES to mid-LAD in 2004, cath in 2006 showing patient stent with moderate CAD in the distal LAD  . DM (diabetes mellitus), type 2 (HCC)   . Glaucoma   . Heart murmur   . Heart palpitations   . Hyperlipidemia   . Hypertension   . PAF (paroxysmal atrial fibrillation) (HCC)    a. diagnosed in 10/2014 b. quit taking Eliquis in 01/2015 due to myalgias. Refuses to be on anticoagulation   Past Surgical History:  Procedure Laterality Date  . CARDIAC CATHETERIZATION     [place] x1 stent  . coronary artery stent  2004  . TONSILLECTOMY AND ADENOIDECTOMY  1949   Family History  Problem Relation Age of Onset  . Hyperlipidemia Mother   . Hypertension Mother   . Hyperlipidemia Father   . Hypertension Father   . Heart disease Father [id number]  . Heart attack Father     Allergies: Niaspan [niacin er] and Lisinopril Current Outpatient Prescriptions on File Prior to Visit  Medication Sig Dispense Refill  . atorvastatin (LIPITOR) 20 MG tablet Take 1 tablet (20 mg total) by mouth at bedtime. 90 tablet 3   . desonide (DESOWEN) 0.05 % cream Apply 1 application topically as needed. (Patient taking differently: Apply 1 application topically as needed (for rosacea). ) 60 g 3  . hydrochlorothiazide (HYDRODIURIL) 25 MG tablet Take 1 tablet (25 mg total) by mouth daily. 90 tablet 3  . rivaroxaban (XARELTO) 20 MG TABS tablet Take 1 tablet (20 mg total) by mouth daily. 30 tablet 3   No current facility-administered medications on file prior to visit.     Social History  Substance Use Topics  . Smoking status: Former Games developermoker  . Smokeless tobacco: Never Used     Comment: quit 25 years ago  . Alcohol use 0.0 oz/week     Comment: very rarely    Review of Systems  Constitutional: Negative for chills and fever.  Respiratory: Negative for cough.   Cardiovascular: Negative for chest pain and palpitations.  Gastrointestinal: Negative for nausea and vomiting.      Objective:    BP (!) 144/78   Pulse 73   Temp 97.8 F (36.6 C) (Oral)   Ht 5\' 4"  (1.626 m)   Wt 234 lb 3.2 oz (106.2 kg)   SpO2 97%   BMI 40.20 kg/m  BP Readings from Last 3 Encounters:  03/07/16 (!) 144/78  10/09/15 130/68  09/04/15 135/77   Wt Readings from Last 3 Encounters:  03/07/16 234 lb 3.2 oz (106.2 kg)  10/09/15 234 lb 4 oz (106.3 kg)  09/04/15 234 lb 4 oz (106.3 kg)    Physical Exam  Constitutional: She appears well-developed and well-nourished.  Eyes: Conjunctivae are normal.  Neck: No thyroid mass and no thyromegaly present.  Cardiovascular: Normal rate, regular rhythm, normal heart sounds and normal pulses.   Pulmonary/Chest: Effort normal and breath sounds normal. She has no wheezes. She has no rhonchi. She has no rales.  Lymphadenopathy:       Head (right side): No submental, no submandibular, no tonsillar, no preauricular, no posterior auricular and no occipital adenopathy present.       Head (left side): No submental, no submandibular, no tonsillar, no preauricular, no posterior auricular and no occipital  adenopathy present.    She has no cervical adenopathy.  Neurological: She is alert.  Skin: Skin is warm and dry.  Psychiatric: She has a normal mood and affect. Her speech is normal and behavior is normal. Thought content normal.  Vitals reviewed.      Assessment & Plan:   Problem List Items Addressed This Visit      Endocrine   Diabetes mellitus type 2, controlled (HCC) - Primary    Pending A1c. Last A1c 7.4 Patient prefers to be on metformin only and requested a referral to diabetic nutritionist. She states she has done well with metformin and diet controlled diabetes in the past. We'll discontinue Januvia. Will continue to follow      Relevant Medications   metFORMIN (GLUCOPHAGE-XR) 500 MG 24 hr tablet   glucose blood test strip   ACCU-CHEK SOFTCLIX LANCETS lancets   Other Relevant Orders   Comprehensive metabolic panel   Hemoglobin A1c   Referral to Nutrition and Diabetes Services     Other   Hyperlipidemia with target LDL less than 70    Stable. Controlled at last visit. We'll continue to monitor and draw lipids at subsequent visit.      Relevant Medications   metoprolol succinate (TOPROL-XL) 100 MG 24 hr tablet    Other Visit Diagnoses   None.      I have discontinued Ms. O'Reilly's JANUVIA. I am also having her maintain her desonide, rivaroxaban, atorvastatin, hydrochlorothiazide, latanoprost, metFORMIN, metoprolol succinate, glucose blood, and ACCU-CHEK SOFTCLIX LANCETS.   Meds ordered this encounter  Medications  . latanoprost (XALATAN) 0.005 % ophthalmic solution  . metFORMIN (GLUCOPHAGE-XR) 500 MG 24 hr tablet    Sig: Take 1-2 tablets (500-1,000 mg total) by mouth 2 (two) times daily. Pt takes one tablet in the morning and two at bedtime.    Dispense:  180 tablet    Refill:  0    Order Specific Question:   Supervising Provider    Answer:   Duncan Dull L [[id number]]  . metoprolol succinate (TOPROL-XL) 100 MG 24 hr tablet    Sig: Take 1 tablet (100 mg  total) by mouth daily. Take with or immediately following a meal.    Dispense:  90 tablet    Refill:  3    Order Specific Question:   Supervising Provider    Answer:   Duncan Dull L [[id number]]  . glucose blood test strip    Sig: 1 each by Other route as needed for other. Use as instructed with Accu Check One Touch Ultra Mini glucometer to test blood sugars twice daily.  Dx E11.9    Dispense:  100 each    Refill:  5    Order Specific  Question:   Supervising Provider    Answer:   Sherlene Shams [[id number]]  . ACCU-CHEK SOFTCLIX LANCETS lancets    Sig: Use as instructed with Accu Check One Touch Ultra Mini glucometer to test blood sugars twice daily.  Dx E11.9    Dispense:  100 each    Refill:  5    Order Specific Question:   Supervising Provider    Answer:   Sherlene Shams [[id number]]    Return precautions given.   Risks, benefits, and alternatives of the medications and treatment plan prescribed today were discussed, and patient expressed understanding.   Education regarding symptom management and diagnosis given to patient on AVS.  Continue to follow with Rennie Plowman, FNP for routine health maintenance.   Gabriela Reed and I agreed with plan.   Rennie Plowman, FNP

## 2016-03-08 ENCOUNTER — Other Ambulatory Visit: Payer: Self-pay | Admitting: Family

## 2016-03-08 ENCOUNTER — Telehealth: Payer: Self-pay | Admitting: Family

## 2016-03-08 DIAGNOSIS — R748 Abnormal levels of other serum enzymes: Secondary | ICD-10-CM

## 2016-03-08 MED ORDER — METFORMIN HCL ER 500 MG PO TB24: ORAL_TABLET | ORAL | 0 refills | Status: DC

## 2016-03-08 NOTE — Telephone Encounter (Signed)
Pt lm on office vm. She still does not have her metFORMIN (GLUCOPHAGE-XR) 500 MG 24 hr tablet refilled and she is out.

## 2016-03-08 NOTE — Telephone Encounter (Signed)
Medication has been ordered this morning and pharmacy received it via electronically

## 2016-03-08 NOTE — Telephone Encounter (Signed)
Medication has been refilled.

## 2016-03-09 DIAGNOSIS — M5432 Sciatica, left side: Secondary | ICD-10-CM | POA: Diagnosis not present

## 2016-03-09 DIAGNOSIS — M9905 Segmental and somatic dysfunction of pelvic region: Secondary | ICD-10-CM | POA: Diagnosis not present

## 2016-03-09 DIAGNOSIS — M9903 Segmental and somatic dysfunction of lumbar region: Secondary | ICD-10-CM | POA: Diagnosis not present

## 2016-03-09 DIAGNOSIS — M955 Acquired deformity of pelvis: Secondary | ICD-10-CM | POA: Diagnosis not present

## 2016-03-17 ENCOUNTER — Telehealth: Payer: Self-pay | Admitting: Cardiovascular Disease

## 2016-03-17 NOTE — Telephone Encounter (Signed)
No samples available, Contacting Rep for samples.

## 2016-03-17 NOTE — Telephone Encounter (Signed)
Patient calling the office for samples of medication:   1.  What medication and dosage are you requesting samples for? xarelto 20 mg  2.  Are you currently out of this medication?  Has a couple left.  Pt states she is in donut hole

## 2016-03-17 NOTE — Telephone Encounter (Signed)
Xarelto 20 mg samples placed at front desk for pick up. 

## 2016-04-01 ENCOUNTER — Encounter: Payer: PPO | Attending: Family | Admitting: Dietician

## 2016-04-01 VITALS — BP 116/64 | Ht 64.0 in | Wt 232.1 lb

## 2016-04-01 DIAGNOSIS — IMO0001 Reserved for inherently not codable concepts without codable children: Secondary | ICD-10-CM

## 2016-04-01 DIAGNOSIS — E119 Type 2 diabetes mellitus without complications: Secondary | ICD-10-CM | POA: Diagnosis not present

## 2016-04-01 DIAGNOSIS — Z713 Dietary counseling and surveillance: Secondary | ICD-10-CM | POA: Insufficient documentation

## 2016-04-01 DIAGNOSIS — Z6839 Body mass index (BMI) 39.0-39.9, adult: Secondary | ICD-10-CM | POA: Insufficient documentation

## 2016-04-01 NOTE — Progress Notes (Signed)
Diabetes Self-Management Education  Visit Type: First/Initial  Appt. Start Time: 1330 Appt. End Time: 1430  04/01/2016  Ms. [name], identified by name and date of birth, is a 75 y.o. female with a diagnosis of Diabetes: Type 2.   ASSESSMENT  Blood pressure 116/64, height 5\' 4"  (1.626 m), weight 232 lb 1.6 oz (105.3 kg). Body mass index is 39.84 kg/m.      Diabetes Self-Management Education - 04/01/16 1344      Visit Information   Visit Type First/Initial     Initial Visit   Diabetes Type Type 2   Date Diagnosed 2009     Health Coping   How would you rate your overall health? Good     Psychosocial Assessment   Patient Belief/Attitude about Diabetes Other (comment)  patient did not comment   Self-care barriers None   Self-management support Family   Other persons present Patient   Patient Concerns Nutrition/Meal planning   Special Needs None   Preferred Learning Style Hands on   Learning Readiness Ready   How often do you need to have someone help you when you read instructions, pamphlets, or other written materials from your doctor or pharmacy? 1 - Never   What is the last grade level you completed in school? --  2.5 years college     Pre-Education Assessment   Patient understands the diabetes disease and treatment process. Demonstrates understanding / competency   Patient understands incorporating nutritional management into lifestyle. Needs Review   Patient undertands incorporating physical activity into lifestyle. Demonstrates understanding / competency   Patient understands using medications safely. Demonstrates understanding / competency   Patient understands monitoring blood glucose, interpreting and using results Needs Review   Patient understands prevention, detection, and treatment of acute complications. Needs Review   Patient understands prevention, detection, and treatment of chronic complications. Demonstrates understanding / competency    Patient understands how to develop strategies to address psychosocial issues. Needs Review   Patient understands how to develop strategies to promote health/change behavior. Demonstrates understanding / competency     Complications   Last HgB A1C per patient/outside source 7.4 %  per patient   How often do you check your blood sugar? 3-4 times / week   Fasting Blood glucose range (mg/dL) 16-109;604-54070-129;130-179   Have you had a dilated eye exam in the past 12 months? Yes   Have you had a dental exam in the past 12 months? Yes   Are you checking your feet? Yes   How many days per week are you checking your feet? 7     Dietary Intake   Breakfast sometimes tea before exercse; egg/ whites with toast, V8, sometimes just fruit: grapefruit/ apples/ cuties/blackberries   Snack (morning) cuties   Lunch salads, sandwich, leftovers   Snack (afternoon) chips and dip   Dinner meat usually chicken or fish, vegetables 9no lima beans)   Snack (evening) none   Beverage(s) water, sugar-free beverages     Exercise   Exercise Type Moderate (swimming / aerobic walking)   How many days per week to you exercise? 4   How many minutes per day do you exercise? 45   Total minutes per week of exercise 180     Patient Education   Previous Diabetes Education Yes (please comment)  [facility]   Nutrition management  Food label reading, portion sizes and measuring food.;Reviewed blood glucose goals for pre and post meals and how to evaluate the patients' food intake on their  blood glucose level.;Meal options for control of blood glucose level and chronic complications.   Monitoring Taught/discussed recording of test results and interpretation of SMBG.     Outcomes   Expected Outcomes Demonstrated interest in learning. Expect positive outcomes   Future DMSE PRN      Individualized Plan for Diabetes Self-Management Training:   Learning Objective:  Patient will have a greater understanding of diabetes  self-management. Patient education plan is to attend individual and/or group sessions per assessed needs and concerns.   Plan:  Developed menus for patient based on her personal food preferences, with goal of 1400kcal daily intake.  Advised her to have at least 1200kcal daily, and a minimum of 9 servings carbohydrate to meet basic nutritional needs.   Patient Instructions   Patient will use menus developed and provided during visit to help with her own meal planning.    Expected Outcomes:  Demonstrated interest in learning. Expect positive outcomes  Education material provided: Planning A Balanced Meal; Quick and Healthy Meal Ideas; Smart Snacking; personal menus  If problems or questions, patient to contact team via:  Phone  Future DSME appointment: PRN; patient declined follow-up visit at this time.

## 2016-04-01 NOTE — Patient Instructions (Signed)
   Patient will use menus developed and provided during visit to help with her own meal planning.

## 2016-04-06 ENCOUNTER — Ambulatory Visit
Admission: RE | Admit: 2016-04-06 | Discharge: 2016-04-06 | Disposition: A | Discharge status: Home / Self Care | Payer: PPO | Source: Ambulatory Visit | Attending: Family | Admitting: Family

## 2016-04-06 DIAGNOSIS — E119 Type 2 diabetes mellitus without complications: Secondary | ICD-10-CM | POA: Insufficient documentation

## 2016-04-06 DIAGNOSIS — Z13820 Encounter for screening for osteoporosis: Secondary | ICD-10-CM | POA: Insufficient documentation

## 2016-04-06 DIAGNOSIS — Z780 Asymptomatic menopausal state: Secondary | ICD-10-CM | POA: Insufficient documentation

## 2016-04-07 DIAGNOSIS — Z780 Asymptomatic menopausal state: Secondary | ICD-10-CM | POA: Diagnosis not present

## 2016-04-13 DIAGNOSIS — M9903 Segmental and somatic dysfunction of lumbar region: Secondary | ICD-10-CM | POA: Diagnosis not present

## 2016-04-13 DIAGNOSIS — M955 Acquired deformity of pelvis: Secondary | ICD-10-CM | POA: Diagnosis not present

## 2016-04-13 DIAGNOSIS — M9905 Segmental and somatic dysfunction of pelvic region: Secondary | ICD-10-CM | POA: Diagnosis not present

## 2016-04-13 DIAGNOSIS — M5432 Sciatica, left side: Secondary | ICD-10-CM | POA: Diagnosis not present

## 2016-04-14 IMAGING — CR DG CHEST 2V
1 series · 2 of 2 positions shown · non-contrast
Comparison: None.

CLINICAL DATA: Heart racing.  Chest pain and numbness in arms.

EXAM:
CHEST  2 VIEW

[Series 1: dg chest 2 view · 0.14mm/px · 2 of 2 slices shown]
[im 1/2]
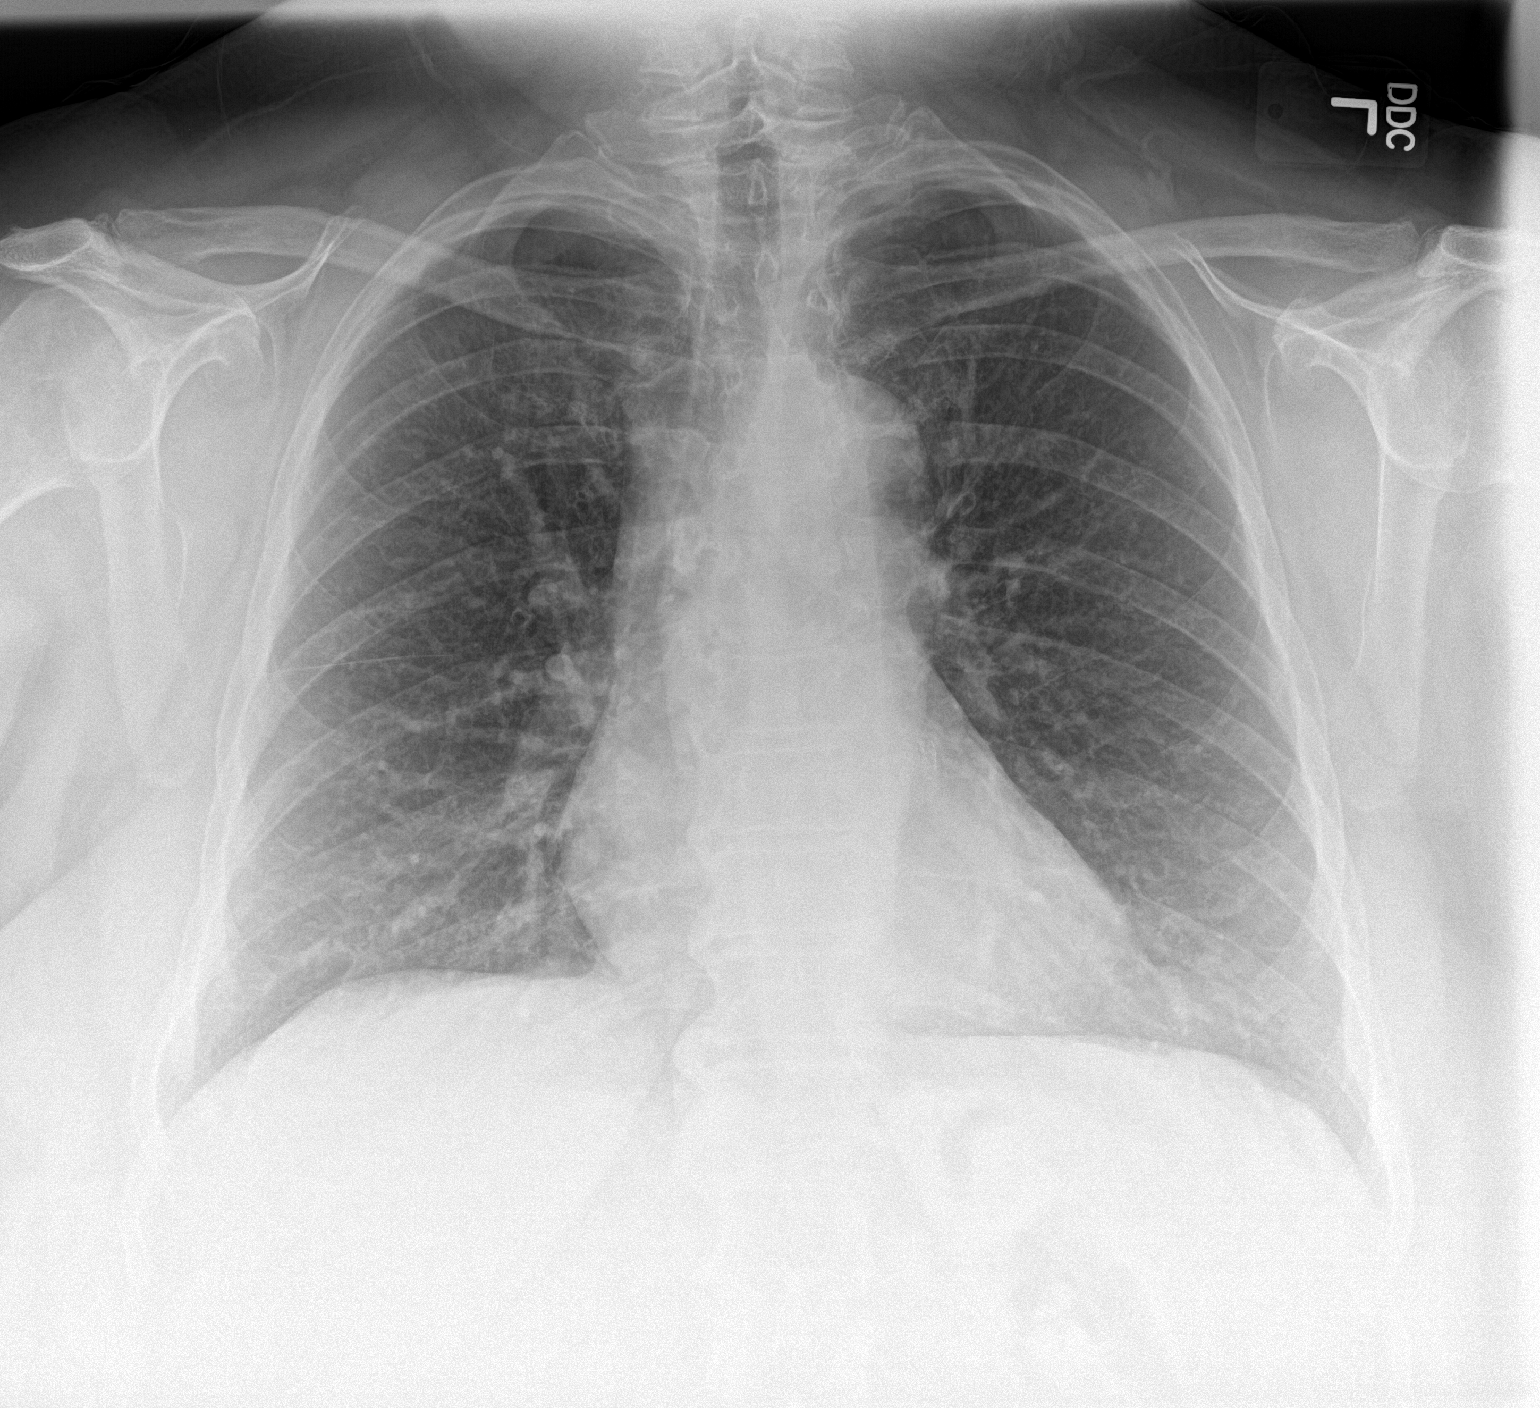
[im 2/2]
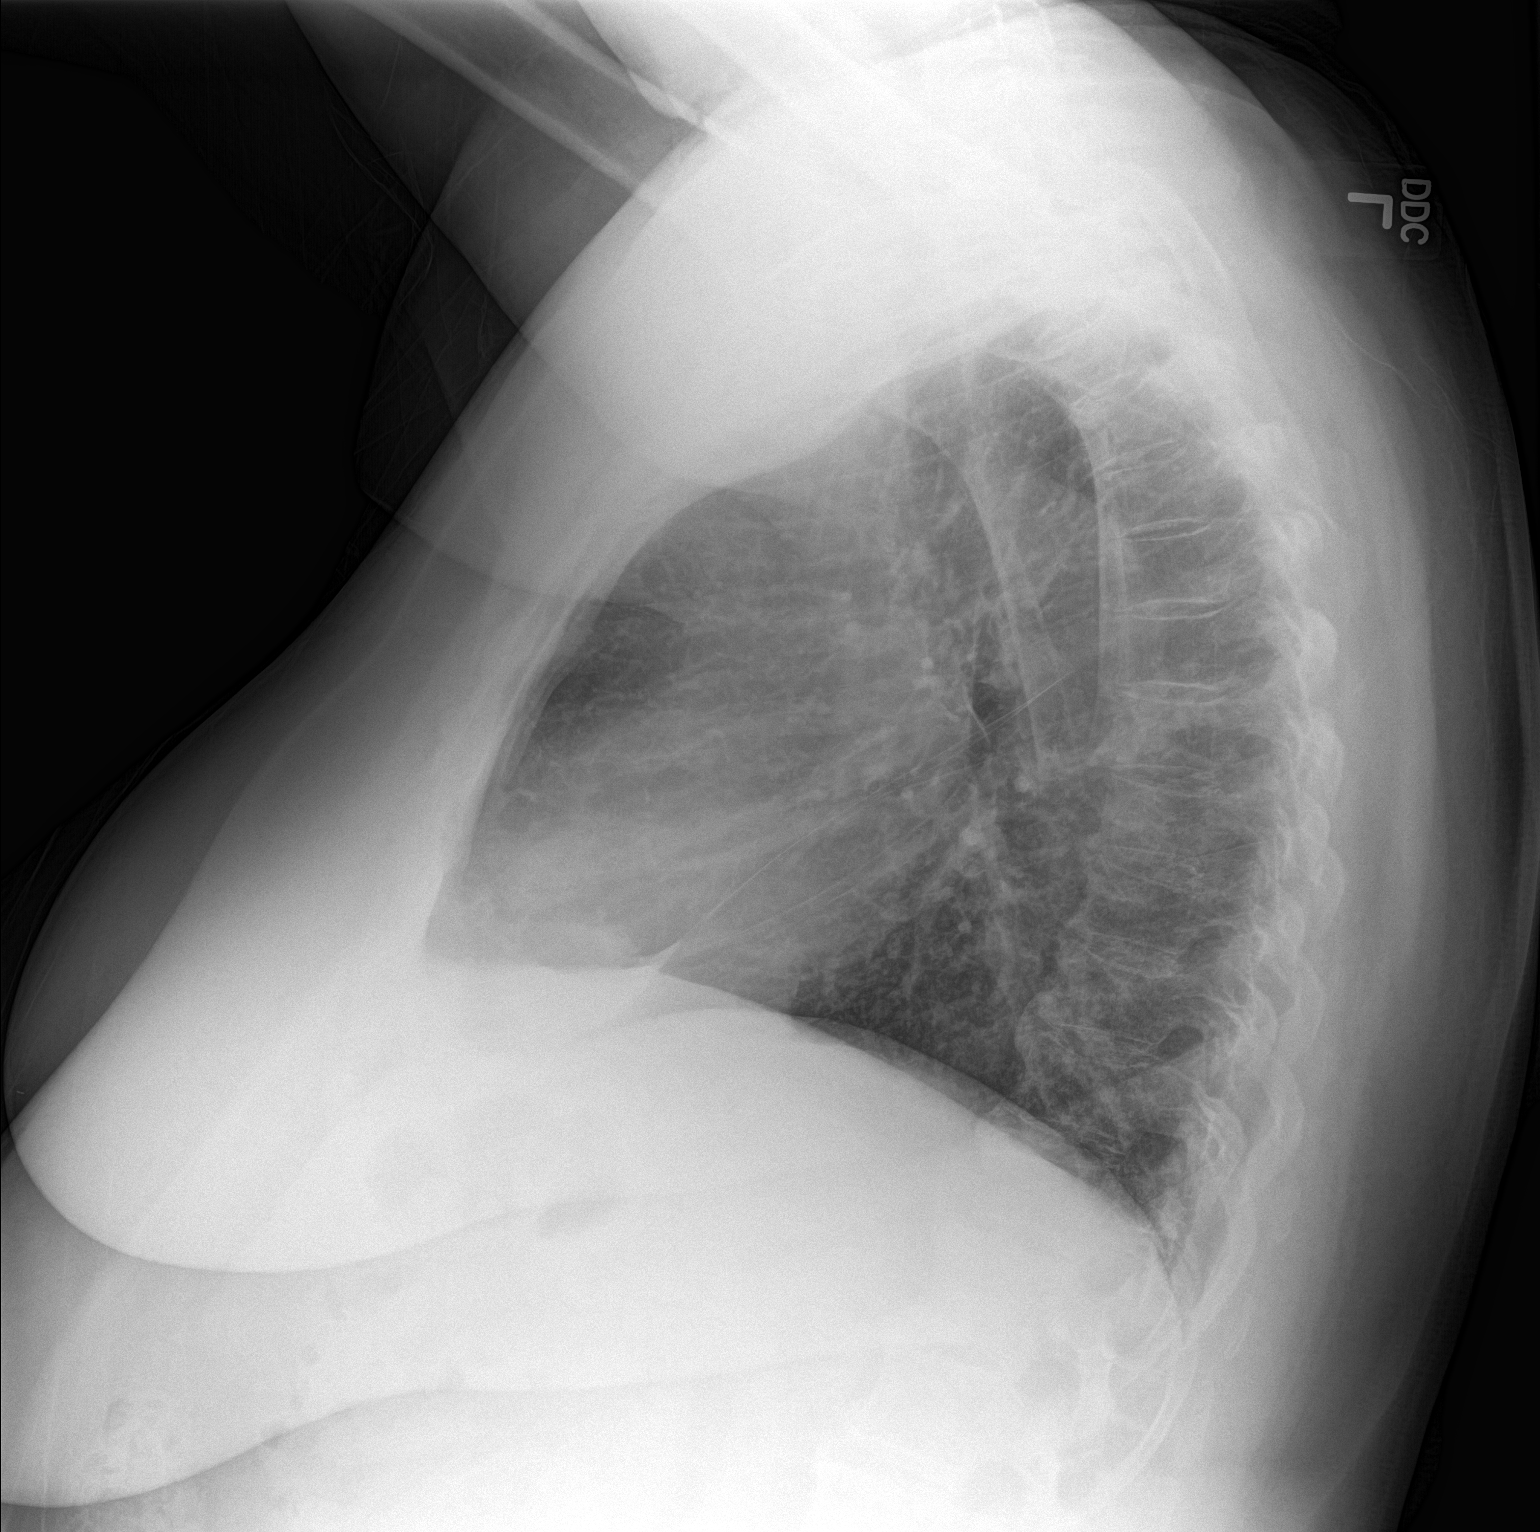

[2 of 2 positions shown; findings below may reference images not displayed]

FINDINGS: The heart size and mediastinal contours are within normal limits.
Both lungs are clear. Spondylosis is identified within the thoracic
spine.
IMPRESSION: No active cardiopulmonary disease.

## 2016-04-28 ENCOUNTER — Telehealth: Payer: Self-pay

## 2016-04-28 NOTE — Telephone Encounter (Signed)
Pt would like Xarelto20mg samples  

## 2016-04-28 NOTE — Telephone Encounter (Signed)
Placed samples at front desk for pick up. 

## 2016-05-03 ENCOUNTER — Other Ambulatory Visit: Payer: Self-pay | Admitting: Family

## 2016-05-03 DIAGNOSIS — E1159 Type 2 diabetes mellitus with other circulatory complications: Secondary | ICD-10-CM

## 2016-05-04 ENCOUNTER — Other Ambulatory Visit: Payer: Self-pay

## 2016-05-04 DIAGNOSIS — E1159 Type 2 diabetes mellitus with other circulatory complications: Secondary | ICD-10-CM

## 2016-05-04 MED ORDER — METFORMIN HCL ER 500 MG PO TB24: ORAL_TABLET | ORAL | 0 refills | Status: DC

## 2016-05-04 NOTE — Telephone Encounter (Signed)
Refilled medication

## 2016-05-18 DIAGNOSIS — M9903 Segmental and somatic dysfunction of lumbar region: Secondary | ICD-10-CM | POA: Diagnosis not present

## 2016-05-18 DIAGNOSIS — M9905 Segmental and somatic dysfunction of pelvic region: Secondary | ICD-10-CM | POA: Diagnosis not present

## 2016-05-18 DIAGNOSIS — M5432 Sciatica, left side: Secondary | ICD-10-CM | POA: Diagnosis not present

## 2016-05-18 DIAGNOSIS — M955 Acquired deformity of pelvis: Secondary | ICD-10-CM | POA: Diagnosis not present

## 2016-06-01 DIAGNOSIS — H401492 Capsular glaucoma with pseudoexfoliation of lens, unspecified eye, moderate stage: Secondary | ICD-10-CM | POA: Diagnosis not present

## 2016-06-15 DIAGNOSIS — M5432 Sciatica, left side: Secondary | ICD-10-CM | POA: Diagnosis not present

## 2016-06-15 DIAGNOSIS — M9903 Segmental and somatic dysfunction of lumbar region: Secondary | ICD-10-CM | POA: Diagnosis not present

## 2016-06-15 DIAGNOSIS — M955 Acquired deformity of pelvis: Secondary | ICD-10-CM | POA: Diagnosis not present

## 2016-06-15 DIAGNOSIS — M9905 Segmental and somatic dysfunction of pelvic region: Secondary | ICD-10-CM | POA: Diagnosis not present

## 2016-06-28 ENCOUNTER — Telehealth: Payer: Self-pay | Admitting: Cardiovascular Disease

## 2016-06-28 ENCOUNTER — Other Ambulatory Visit: Payer: Self-pay | Admitting: Cardiovascular Disease

## 2016-06-28 NOTE — Telephone Encounter (Signed)
Patient calling the office for samples of medication:   1.  What medication and dosage are you requesting samples for? xarelto 20mg  2.  Are you currently out of this medication? "Almost out"    

## 2016-06-28 NOTE — Telephone Encounter (Signed)
Spoke with patient made her aware that the samples for  Xarelto Lot number: [id number] Expiration date: 4/20 are up front ready for pick up.

## 2016-07-06 ENCOUNTER — Other Ambulatory Visit: Payer: Self-pay | Admitting: Family

## 2016-07-06 DIAGNOSIS — E1159 Type 2 diabetes mellitus with other circulatory complications: Secondary | ICD-10-CM

## 2016-07-08 NOTE — Telephone Encounter (Signed)
[facility] [address]

## 2016-07-08 NOTE — Telephone Encounter (Signed)
Pt called and stated that she is completely out of medication, and wants to if she could have a 90 days supply. She has issues with this being filled. Please call when completed. Please advise, thank you!  Pharmacy - [facility] [address] - [place], [place] - [id number]  Call pt @ [phone]

## 2016-07-13 DIAGNOSIS — M5432 Sciatica, left side: Secondary | ICD-10-CM | POA: Diagnosis not present

## 2016-07-13 DIAGNOSIS — M955 Acquired deformity of pelvis: Secondary | ICD-10-CM | POA: Diagnosis not present

## 2016-07-13 DIAGNOSIS — M9903 Segmental and somatic dysfunction of lumbar region: Secondary | ICD-10-CM | POA: Diagnosis not present

## 2016-07-13 DIAGNOSIS — M9905 Segmental and somatic dysfunction of pelvic region: Secondary | ICD-10-CM | POA: Diagnosis not present

## 2016-07-27 ENCOUNTER — Telehealth: Payer: Self-pay | Admitting: Cardiovascular Disease

## 2016-07-27 NOTE — Telephone Encounter (Signed)
Patient notified no samples available of xarelto 20 mg at this time. The patient needs before Monday, going out of town. Patient is aware someone from our office will contact her back when samples available.

## 2016-07-27 NOTE — Telephone Encounter (Signed)
Samples of XARELTO 20 MG TABS tablet. Please call when ready for pick up. Thanks!!

## 2016-07-28 NOTE — Telephone Encounter (Signed)
Pt. Notified samples available of Xarelto 20 mg # 4 boxes Lot#  [id number] Exp. 08/2018.

## 2016-08-10 DIAGNOSIS — M955 Acquired deformity of pelvis: Secondary | ICD-10-CM | POA: Diagnosis not present

## 2016-08-10 DIAGNOSIS — M9903 Segmental and somatic dysfunction of lumbar region: Secondary | ICD-10-CM | POA: Diagnosis not present

## 2016-08-10 DIAGNOSIS — M5432 Sciatica, left side: Secondary | ICD-10-CM | POA: Diagnosis not present

## 2016-08-10 DIAGNOSIS — M9905 Segmental and somatic dysfunction of pelvic region: Secondary | ICD-10-CM | POA: Diagnosis not present

## 2016-08-31 DIAGNOSIS — H401492 Capsular glaucoma with pseudoexfoliation of lens, unspecified eye, moderate stage: Secondary | ICD-10-CM | POA: Diagnosis not present

## 2016-09-05 ENCOUNTER — Ambulatory Visit (INDEPENDENT_AMBULATORY_CARE_PROVIDER_SITE_OTHER): Payer: PPO | Admitting: Family Medicine

## 2016-09-05 ENCOUNTER — Telehealth: Payer: Self-pay | Admitting: Family

## 2016-09-05 ENCOUNTER — Ambulatory Visit: Payer: Self-pay | Admitting: Family

## 2016-09-05 ENCOUNTER — Encounter: Payer: Self-pay | Admitting: Family Medicine

## 2016-09-05 DIAGNOSIS — E1159 Type 2 diabetes mellitus with other circulatory complications: Secondary | ICD-10-CM

## 2016-09-05 DIAGNOSIS — I10 Essential (primary) hypertension: Secondary | ICD-10-CM

## 2016-09-05 DIAGNOSIS — E785 Hyperlipidemia, unspecified: Secondary | ICD-10-CM | POA: Diagnosis not present

## 2016-09-05 LAB — LIPID PANEL
CHOLESTEROL: 147 mg/dL (ref 0–200)
HDL: 46.1 mg/dL (ref >39.00)
LDL Cholesterol: 75 mg/dL (ref 0–99)
NonHDL: 100.46
Total CHOL/HDL Ratio: 3
Triglycerides: 125 mg/dL (ref 0.0–149.0)
VLDL: 25 mg/dL (ref 0.0–40.0)

## 2016-09-05 LAB — COMPREHENSIVE METABOLIC PANEL WITH GFR
ALT: 34 U/L (ref 0–35)
AST: 27 U/L (ref 0–37)
Albumin: 4.5 g/dL (ref 3.5–5.2)
Alkaline Phosphatase: 63 U/L (ref 39–117)
BUN: 17 mg/dL (ref 6–23)
CO2: 29 meq/L (ref 19–32)
Calcium: 9.9 mg/dL (ref 8.4–10.5)
Chloride: 103 meq/L (ref 96–112)
Creatinine, Ser: 0.82 mg/dL (ref 0.40–1.20)
GFR: 72.49 mL/min
Glucose, Bld: 159 mg/dL — ABNORMAL HIGH (ref 70–99)
Potassium: 3.9 meq/L (ref 3.5–5.1)
Sodium: 139 meq/L (ref 135–145)
Total Bilirubin: 0.7 mg/dL (ref 0.2–1.2)
Total Protein: 7.5 g/dL (ref 6.0–8.3)

## 2016-09-05 LAB — HEMOGLOBIN A1C: HEMOGLOBIN A1C: 7.9 % — AB (ref 4.6–6.5)

## 2016-09-05 MED ORDER — METOPROLOL SUCCINATE ER 100 MG PO TB24: 100.0000 mg | ORAL_TABLET | Freq: Every day | ORAL | 3 refills | Status: DC

## 2016-09-05 MED ORDER — METFORMIN HCL ER (MOD) 1000 MG PO TB24: 1000.0000 mg | ORAL_TABLET | Freq: Two times a day (BID) | ORAL | 3 refills | Status: DC

## 2016-09-05 MED ORDER — ATORVASTATIN CALCIUM 20 MG PO TABS: 20.0000 mg | ORAL_TABLET | Freq: Every day | ORAL | 3 refills | Status: DC

## 2016-09-05 MED ORDER — GLUCOSE BLOOD VI STRP: ORAL_STRIP | 12 refills | Status: DC

## 2016-09-05 MED ORDER — ONETOUCH DELICA LANCETS 33G MISC: 11 refills | Status: DC

## 2016-09-05 MED ORDER — METFORMIN HCL ER 500 MG PO TB24: 1000.0000 mg | ORAL_TABLET | Freq: Two times a day (BID) | ORAL | 1 refills | Status: DC

## 2016-09-05 MED ORDER — HYDROCHLOROTHIAZIDE 25 MG PO TABS: 25.0000 mg | ORAL_TABLET | Freq: Every day | ORAL | 3 refills | Status: DC

## 2016-09-05 NOTE — Assessment & Plan Note (Signed)
Uncontrolled.  A1C 7.9 today.  Increasing metformin to 1000 mg BID.

## 2016-09-05 NOTE — Telephone Encounter (Signed)
I will switch it back.

## 2016-09-05 NOTE — Assessment & Plan Note (Signed)
Stable. Continue Metoprolol and HCTZ. 

## 2016-09-05 NOTE — Progress Notes (Signed)
Pre visit review using our clinic review tool, if applicable. No additional management support is needed unless otherwise documented below in the visit note. 

## 2016-09-05 NOTE — Assessment & Plan Note (Signed)
LDL 75 today. Needs increase in Lipitor.

## 2016-09-05 NOTE — Patient Instructions (Signed)
Medications as prescribed.  Follow-up in 3 months  Take care  Dr. Kirin Brandenburger  

## 2016-09-05 NOTE — Telephone Encounter (Signed)
Pt called about the medication of metFORMIN (GLUMETZA) 1000 MG (MOD) 24 hr tablet. Pt states it's not in her formulary. Please advise?  Pharmacy is [facility] [address], [place] - [address]  Thank you!  Call pt @ [phone].

## 2016-09-05 NOTE — Progress Notes (Signed)
Subjective:  Patient ID: Gabriela Reed, female    DOB: 10/27/1940  Age: 76 y.o. MRN: [id number]  CC: Follow up  HPI:  76 year old female with Morbid obesity, HTN, HLD, DM-2, CAD, Atrial fib presents for follow up.  DM-2  Uncontrolled.  Sugars have been rising; 170's recently.  Currently on Metformin (total 1500 mg daily).  Needs A1C today.  HTN  Has been stable on HCTZ, Metoprolol.  HLD  Has been at goal on Lipitor.  Needs lipid panel today.  Social Hx   Social History   Social History  . Marital status: Divorced    Spouse name: N/A  . Number of children: N/A  . Years of education: N/A   Social History Main Topics  . Smoking status: Former Games developer  . Smokeless tobacco: Never Used     Comment: quit 25 years ago  . Alcohol use 0.0 oz/week     Comment: very rarely  . Drug use: No  . Sexual activity: Not Asked   Other Topics Concern  . None   Social History Narrative   Lives in [place]. From [place] and moved here for her daughter, granddaughter. in home.      Work - retired.   Diet - regular   Exercise - [employer]    Review of Systems  Constitutional: Negative.   Respiratory: Negative.   Cardiovascular: Negative.    Objective:  BP (!) 144/80   Pulse 70   Temp 97.8 F (36.6 C) (Oral)   Wt 235 lb 4 oz (106.7 kg)   SpO2 92%   BMI 40.38 kg/m   BP/Weight 09/05/2016 04/01/2016 03/07/2016  Systolic BP 144 116 144  Diastolic BP 80 64 78  Wt. (Lbs) 235.25 232.1 234.2  BMI 40.38 39.84 40.2   Physical Exam  Constitutional: She is oriented to person, place, and time. She appears well-developed. No distress.  Cardiovascular: Normal rate.   Pulmonary/Chest: Effort normal and breath sounds normal.  Neurological: She is alert and oriented to person, place, and time.  Skin: No rash noted.  Psychiatric: She has a normal mood and affect.  Vitals reviewed.  Lab Results  Component Value Date   WBC 4.6 07/08/2015   HGB 14.1 06/24/2015   HCT 41.4  07/08/2015   PLT 183 07/08/2015   GLUCOSE 159 (H) 09/05/2016   CHOL 147 09/05/2016   TRIG 125.0 09/05/2016   HDL 46.10 09/05/2016   LDLCALC 75 09/05/2016   ALT 34 09/05/2016   AST 27 09/05/2016   NA 139 09/05/2016   K 3.9 09/05/2016   CL 103 09/05/2016   CREATININE 0.82 09/05/2016   BUN 17 09/05/2016   CO2 29 09/05/2016   TSH 4.58 (H) 09/11/2014   HGBA1C 7.9 (H) 09/05/2016   MICROALBUR 1.5 09/04/2015    Assessment & Plan:   Problem List Items Addressed This Visit    Hypertension    Stable. Continue Metoprolol and HCTZ.      Relevant Medications   metoprolol succinate (TOPROL-XL) 100 MG 24 hr tablet   hydrochlorothiazide (HYDRODIURIL) 25 MG tablet   atorvastatin (LIPITOR) 20 MG tablet   Hyperlipidemia with target LDL less than 70    LDL 75 today. Needs increase in Lipitor.      Relevant Medications   metoprolol succinate (TOPROL-XL) 100 MG 24 hr tablet   hydrochlorothiazide (HYDRODIURIL) 25 MG tablet   atorvastatin (LIPITOR) 20 MG tablet   Other Relevant Orders   Lipid panel (Completed)   Diabetes mellitus (HCC)  Uncontrolled.  A1C 7.9 today.  Increasing metformin to 1000 mg BID.      Relevant Medications   atorvastatin (LIPITOR) 20 MG tablet   metFORMIN (GLUCOPHAGE-XR) 500 MG 24 hr tablet   Other Relevant Orders   Hemoglobin A1c (Completed)   Comprehensive metabolic panel (Completed)      Meds ordered this encounter  Medications  . metoprolol succinate (TOPROL-XL) 100 MG 24 hr tablet    Sig: Take 1 tablet (100 mg total) by mouth daily. Take with or immediately following a meal.    Dispense:  90 tablet    Refill:  3  . DISCONTD: metFORMIN (GLUMETZA) 1000 MG (MOD) 24 hr tablet    Sig: Take 1 tablet (1,000 mg total) by mouth 2 (two) times daily.    Dispense:  180 tablet    Refill:  3    Please consider 90 day supplies to promote better adherence  . hydrochlorothiazide (HYDRODIURIL) 25 MG tablet    Sig: Take 1 tablet (25 mg total) by mouth daily.     Dispense:  90 tablet    Refill:  3  . atorvastatin (LIPITOR) 20 MG tablet    Sig: Take 1 tablet (20 mg total) by mouth at bedtime.    Dispense:  90 tablet    Refill:  3  . ONETOUCH DELICA LANCETS 33G MISC    Sig: Use up to 4 times daily to check blood sugar.    Dispense:  100 each    Refill:  11  . glucose blood (ONE TOUCH ULTRA TEST) test strip    Sig: Use up to 4 times daily to check blood sugar.    Dispense:  100 each    Refill:  12  . metFORMIN (GLUCOPHAGE-XR) 500 MG 24 hr tablet    Sig: Take 2 tablets (1,000 mg total) by mouth 2 (two) times daily.    Dispense:  360 tablet    Refill:  1    Please consider 90 day supplies to promote better adherence    Follow-up: 3 months  Tashi Andujo Adriana Simas DO [facility]

## 2016-09-07 DIAGNOSIS — H401193 Primary open-angle glaucoma, unspecified eye, severe stage: Secondary | ICD-10-CM | POA: Diagnosis not present

## 2016-09-14 DIAGNOSIS — M955 Acquired deformity of pelvis: Secondary | ICD-10-CM | POA: Diagnosis not present

## 2016-09-14 DIAGNOSIS — M9905 Segmental and somatic dysfunction of pelvic region: Secondary | ICD-10-CM | POA: Diagnosis not present

## 2016-09-14 DIAGNOSIS — M5432 Sciatica, left side: Secondary | ICD-10-CM | POA: Diagnosis not present

## 2016-09-14 DIAGNOSIS — M9903 Segmental and somatic dysfunction of lumbar region: Secondary | ICD-10-CM | POA: Diagnosis not present

## 2016-10-08 NOTE — Progress Notes (Signed)
Cardiology Office Note  Date:  10/10/2016   ID:  Gabriela Reed, DOB 02/22/1941, MRN [id number]  PCP:  Tommie Samsook, Jayce G, DO   Chief Complaint  Patient presents with  . other    12mo f/u. Pt  Reviewed meds with pt verbally. Pt. had some A-fib spells in April but "doing well."   Last office visit May 2017  HPI:  Gabriela Reed is a 76 y.o.  woman with history of  morbid obesity, Diabetes type 2  paroxysmal atrial fibrillation,  coronary artery disease with stent to the LAD in 2004, presenting for routine follow-up of her coronary artery disease and PAF  More episodes of atrial fib in April 2018 12 episodes Lasting minutes to hours, never more than 24 hours Now better  She does not have a "emergency pill" for her episodes of atrial fibrillation Tolerating anticoagulation Chronic tinnitus Taking metoprolol 100 mg daily   Reports she is slowly losing weight, trying to exercise more  EKG personally reviewed by myself on todays visit Shows normal sinus rhythm with rate 68 bpm no significant ST or T-wave changes  Other past medical history reviewed  Prior stress test 06/25/2015 showing no ischemia   hospital January 26 with chest pain, paroxysmal atrial fibrillation The daytime was not on anticoagulation. Long discussion, she agreed to restart anticoagulation  November 27, 2014:  event monitor and was found to have atrial fibrillation .    PMH:   has a past medical history of Chicken pox; Coronary artery disease; DM (diabetes mellitus), type 2 (HCC); Glaucoma; Heart murmur; Heart palpitations; Hyperlipidemia; Hypertension; and PAF (paroxysmal atrial fibrillation) (HCC).  PSH:    Past Surgical History:  Procedure Laterality Date  . CARDIAC CATHETERIZATION     [place] x1 stent  . coronary artery stent  2004  . TONSILLECTOMY AND ADENOIDECTOMY  1949    Current Outpatient Prescriptions  Medication Sig Dispense Refill  . ACCU-CHEK SOFTCLIX LANCETS lancets Use as instructed  with Accu Check One Touch Ultra Mini glucometer to test blood sugars twice daily.  Dx E11.9 100 each 5  . atorvastatin (LIPITOR) 20 MG tablet Take 1 tablet (20 mg total) by mouth at bedtime. 90 tablet 3  . desonide (DESOWEN) 0.05 % cream Apply 1 application topically as needed. (Patient taking differently: Apply 1 application topically as needed (for rosacea). ) 60 g 3  . glucose blood (ONE TOUCH ULTRA TEST) test strip Use up to 4 times daily to check blood sugar. 100 each 12  . glucose blood test strip 1 each by Other route as needed for other. Use as instructed with Accu Check One Touch Ultra Mini glucometer to test blood sugars twice daily.  Dx E11.9 100 each 5  . hydrochlorothiazide (HYDRODIURIL) 25 MG tablet Take 1 tablet (25 mg total) by mouth daily. 90 tablet 3  . metFORMIN (GLUCOPHAGE-XR) 500 MG 24 hr tablet Take 2 tablets (1,000 mg total) by mouth 2 (two) times daily. 360 tablet 1  . metoprolol succinate (TOPROL-XL) 100 MG 24 hr tablet Take 1 tablet (100 mg total) by mouth daily. Take with or immediately following a meal. 90 tablet 3  . ONETOUCH DELICA LANCETS 33G MISC Use up to 4 times daily to check blood sugar. 100 each 11  . XARELTO 20 MG TABS tablet TAKE ONE TABLET BY MOUTH ONCE DAILY WITH  SUPPER 30 tablet 11   No current facility-administered medications for this visit.      Allergies:   Niaspan [niacin er] and Lisinopril  Social History:  The patient  reports that she has quit smoking. She has never used smokeless tobacco. She reports that she drinks alcohol. She reports that she does not use drugs.   Family History:   family history includes Heart attack in her father; Heart disease (age of onset: 24) in her father; Hyperlipidemia in her father and mother; Hypertension in her father and mother.    Review of Systems: Review of Systems  Constitutional: Negative.   Respiratory: Negative.   Cardiovascular: Positive for palpitations.       Tachycardia  Gastrointestinal:  Negative.   Musculoskeletal: Negative.   Neurological: Negative.   Psychiatric/Behavioral: Negative.   All other systems reviewed and are negative.    PHYSICAL EXAM: VS:  BP (!) 160/86 (BP Location: Left Arm, Patient Position: Sitting, Cuff Size: Normal)   Pulse 68   Ht 5\' 4"  (1.626 m)   Wt 235 lb 8 oz (106.8 kg)   BMI 40.42 kg/m  , BMI Body mass index is 40.42 kg/m. GEN: Well nourished, well developed, in no acute distress , obese HEENT: normal  Neck: no JVD, carotid bruits, or masses Cardiac: RRR; no murmurs, rubs, or gallops,no edema  Respiratory:  clear to auscultation bilaterally, normal work of breathing GI: soft, nontender, nondistended, + BS MS: no deformity or atrophy  Skin: warm and dry, no rash Neuro:  Strength and sensation are intact Psych: euthymic mood, full affect    Recent Labs: 09/05/2016: ALT 34; BUN 17; Creatinine, Ser 0.82; Potassium 3.9; Sodium 139    Lipid Panel Lab Results  Component Value Date   CHOL 147 09/05/2016   HDL 46.10 09/05/2016   LDLCALC 75 09/05/2016   TRIG 125.0 09/05/2016      Wt Readings from Last 3 Encounters:  10/10/16 235 lb 8 oz (106.8 kg)  09/05/16 235 lb 4 oz (106.7 kg)  04/01/16 232 lb 1.6 oz (105.3 kg)       ASSESSMENT AND PLAN:  Paroxysmal atrial fibrillation (HCC) Discussed various treatment options for her paroxysmal episodes Suggested she take propranolol and diltiazem low-dose as needed for breakthrough episodes  We did discuss use of antiarrhythmics if she has more frequent episodes   Coronary artery disease of native artery of native heart with stable angina pectoris (HCC) Currently with no symptoms of angina. No further workup at this time. Continue current medication regimen.  Essential hypertension Blood pressure elevated on today's visit  Recommended she monitor pressures at home and call us if it continues to run high  Recommended weight loss   Hyperlipidemia with target LDL less than  70 Cholesterol is at goal on the current lipid regimen. No changes to the medications were made.  Type 2 diabetes mellitus with other circulatory complication, without long-term current use of insulin (HCC) Stressed importance of weight loss  Hemoglobin A1c 7.9  Eating bread  Morbid obesity (HCC) Recommended she continue her exercise program but watch her carbohydrate intake   Total encounter time more than 25 minutes  Greater than 50% was spent in counseling and coordination of care with the patient   Disposition:   F/U  12 months  No orders of the defined types were placed in this encounter.    Signed, Dossie Arbour, M.D., Ph.D. 10/10/2016  [facility] [place], [place] [phone]

## 2016-10-10 ENCOUNTER — Ambulatory Visit (INDEPENDENT_AMBULATORY_CARE_PROVIDER_SITE_OTHER): Payer: PPO | Admitting: Cardiovascular Disease

## 2016-10-10 ENCOUNTER — Encounter: Payer: Self-pay | Admitting: Cardiovascular Disease

## 2016-10-10 VITALS — BP 160/86 | HR 68 | Ht 64.0 in | Wt 235.5 lb

## 2016-10-10 DIAGNOSIS — I480 Paroxysmal atrial fibrillation: Secondary | ICD-10-CM | POA: Diagnosis not present

## 2016-10-10 DIAGNOSIS — I25118 Atherosclerotic heart disease of native coronary artery with other forms of angina pectoris: Secondary | ICD-10-CM | POA: Diagnosis not present

## 2016-10-10 DIAGNOSIS — E1159 Type 2 diabetes mellitus with other circulatory complications: Secondary | ICD-10-CM | POA: Diagnosis not present

## 2016-10-10 DIAGNOSIS — I10 Essential (primary) hypertension: Secondary | ICD-10-CM

## 2016-10-10 DIAGNOSIS — E785 Hyperlipidemia, unspecified: Secondary | ICD-10-CM | POA: Diagnosis not present

## 2016-10-10 MED ORDER — PROPRANOLOL HCL 20 MG PO TABS: 20.0000 mg | ORAL_TABLET | Freq: Three times a day (TID) | ORAL | 3 refills | Status: DC | PRN

## 2016-10-10 MED ORDER — DILTIAZEM HCL 30 MG PO TABS: 30.0000 mg | ORAL_TABLET | Freq: Three times a day (TID) | ORAL | 3 refills | Status: DC | PRN

## 2016-10-10 NOTE — Patient Instructions (Addendum)
Medication Instructions:   Take diltiazem and propranolol as needed for atrial fib, Take up to every 4 to 6 hours  Labwork:  No new labs needed  Testing/Procedures:  No further testing at this time   I recommend watching educational videos on topics of interest to you at:       www.goemmi.com  Enter code: HEARTCARE    Follow-Up: It was a pleasure seeing you in the office today. Please call us if you have new issues that need to be addressed before your next appt.  [phone]  Your physician wants you to follow-up in: 12 months.  You will receive a reminder letter in the mail two months in advance. If you don't receive a letter, please call our office to schedule the follow-up appointment.  If you need a refill on your cardiac medications before your next appointment, please call your pharmacy.

## 2016-10-12 ENCOUNTER — Telehealth: Payer: Self-pay | Admitting: Cardiovascular Disease

## 2016-10-12 DIAGNOSIS — M955 Acquired deformity of pelvis: Secondary | ICD-10-CM | POA: Diagnosis not present

## 2016-10-12 DIAGNOSIS — M9903 Segmental and somatic dysfunction of lumbar region: Secondary | ICD-10-CM | POA: Diagnosis not present

## 2016-10-12 DIAGNOSIS — M9905 Segmental and somatic dysfunction of pelvic region: Secondary | ICD-10-CM | POA: Diagnosis not present

## 2016-10-12 DIAGNOSIS — M5432 Sciatica, left side: Secondary | ICD-10-CM | POA: Diagnosis not present

## 2016-10-12 NOTE — Telephone Encounter (Signed)
Patient just picked up new rx for propanolol and diltiazem both bottles are labeled prn   Patient wants to clarify dosage and also if she should have both meds   Please call.

## 2016-10-12 NOTE — Telephone Encounter (Signed)
Per 5/14 OV notes: "Discussed various treatment options for her paroxysmal episodes Suggested she take propranolol and diltiazem low-dose as needed for breakthrough episodes  We did discuss use of antiarrhythmics if she has more frequent episodes "  Pt called to clarify instructions for taking propranolol and diltiazem. Reviewed with pt who verbalized understanding and is agreeable w/plan.

## 2016-11-07 ENCOUNTER — Telehealth: Payer: Self-pay | Admitting: *Deleted

## 2016-11-07 MED ORDER — ATORVASTATIN CALCIUM 20 MG PO TABS: 20.0000 mg | ORAL_TABLET | Freq: Every day | ORAL | 3 refills | Status: DC

## 2016-11-07 NOTE — Telephone Encounter (Signed)
Sent rx to the pharmacy, thanks

## 2016-11-07 NOTE — Telephone Encounter (Signed)
Medication Refill requested for : atorvastatin  Pharmacy:[facility] [address]  Return Contact : [phone]

## 2016-11-09 DIAGNOSIS — M9905 Segmental and somatic dysfunction of pelvic region: Secondary | ICD-10-CM | POA: Diagnosis not present

## 2016-11-09 DIAGNOSIS — M9903 Segmental and somatic dysfunction of lumbar region: Secondary | ICD-10-CM | POA: Diagnosis not present

## 2016-11-09 DIAGNOSIS — M5432 Sciatica, left side: Secondary | ICD-10-CM | POA: Diagnosis not present

## 2016-11-09 DIAGNOSIS — M955 Acquired deformity of pelvis: Secondary | ICD-10-CM | POA: Diagnosis not present

## 2016-11-15 ENCOUNTER — Telehealth: Payer: Self-pay

## 2016-11-15 NOTE — Telephone Encounter (Signed)
Pt would like Xarelto samples.  

## 2016-11-15 NOTE — Telephone Encounter (Signed)
Xarelto 20 mg samples placed at front desk for pick up. 

## 2016-12-07 ENCOUNTER — Ambulatory Visit (INDEPENDENT_AMBULATORY_CARE_PROVIDER_SITE_OTHER): Payer: PPO | Admitting: Family Medicine

## 2016-12-07 ENCOUNTER — Ambulatory Visit: Payer: Self-pay | Admitting: Family

## 2016-12-07 ENCOUNTER — Encounter: Payer: Self-pay | Admitting: Family Medicine

## 2016-12-07 VITALS — BP 142/84 | HR 74 | Temp 98.2°F | Wt 230.1 lb

## 2016-12-07 DIAGNOSIS — I10 Essential (primary) hypertension: Secondary | ICD-10-CM | POA: Diagnosis not present

## 2016-12-07 DIAGNOSIS — E1159 Type 2 diabetes mellitus with other circulatory complications: Secondary | ICD-10-CM

## 2016-12-07 DIAGNOSIS — E785 Hyperlipidemia, unspecified: Secondary | ICD-10-CM

## 2016-12-07 LAB — POCT GLYCOSYLATED HEMOGLOBIN (HGB A1C): HEMOGLOBIN A1C: 7

## 2016-12-07 NOTE — Assessment & Plan Note (Signed)
Stable.  Continue current medications.

## 2016-12-07 NOTE — Patient Instructions (Signed)
A1C is at goal.  Continue what you're doing.  Follow up in 6 months.  Take care  Dr. Adriana Simasook

## 2016-12-07 NOTE — Assessment & Plan Note (Signed)
At goal on Lipitor. Continue. 

## 2016-12-07 NOTE — Progress Notes (Signed)
Subjective:  Patient ID: Gabriela Reed, female    DOB: 06/14/1940  Age: 76 y.o. MRN: [id number]  CC: Follow up   HPI:  76 year old female with coronary artery disease, hypertension, paroxysmal A. fib, hyperlipidemia, DM 2 presents for follow-up.  DM-2  Blood sugars seem to be improving. Was 150 this morning.  She endorses compliance with metformin.  She is exercising and trying to lose weight.  Hypertension  Stable on HCTZ, Metoprolol, Diltiazem.  HLD  At goal on Lipitor.  Social Hx   Social History   Social History  . Marital status: Divorced    Spouse name: N/A  . Number of children: N/A  . Years of education: N/A   Social History Main Topics  . Smoking status: Former Games developermoker  . Smokeless tobacco: Never Used     Comment: quit 25 years ago  . Alcohol use 0.0 oz/week     Comment: very rarely  . Drug use: No  . Sexual activity: Not Asked   Other Topics Concern  . None   Social History Narrative   [place]ives in [place]. From [place] and moved here for her daughter, granddaughter. in home.      Work - retired.   Diet - regular   Exercise - [employer]    Review of Systems  Respiratory: Negative.   Cardiovascular: Negative.    Objective:  BP (!) 142/84 (BP Location: Left Arm, Patient Position: Sitting, Cuff Size: Large)   Pulse 74   Temp 98.2 F (36.8 C) (Oral)   Wt 230 lb 2 oz (104.4 kg)   SpO2 95%   BMI 39.50 kg/m   BP/Weight 12/07/2016 10/10/2016 09/05/2016  Systolic BP 142 160 144  Diastolic BP 84 86 80  Wt. (Lbs) 230.13 235.5 235.25  BMI 39.5 40.42 40.38    Physical Exam  Constitutional: She is oriented to person, place, and time. She appears well-developed. No distress.  Cardiovascular: Normal rate and regular rhythm.   Pulmonary/Chest: Effort normal. She has no wheezes. She has no rales.  Neurological: She is alert and oriented to person, place, and time.  Psychiatric: She has a normal mood and affect.  Vitals reviewed.   Lab Results    Component Value Date   WBC 4.6 07/08/2015   HGB 14.2 07/08/2015   HCT 41.4 07/08/2015   PLT 183 07/08/2015   GLUCOSE 159 (H) 09/05/2016   CHOL 147 09/05/2016   TRIG 125.0 09/05/2016   HDL 46.10 09/05/2016   LDLCALC 75 09/05/2016   ALT 34 09/05/2016   AST 27 09/05/2016   NA 139 09/05/2016   K 3.9 09/05/2016   CL 103 09/05/2016   CREATININE 0.82 09/05/2016   BUN 17 09/05/2016   CO2 29 09/05/2016   TSH 4.58 (H) 09/11/2014   HGBA1C 7.0 12/07/2016   MICROALBUR 1.5 09/04/2015    Assessment & Plan:   Problem List Items Addressed This Visit      Cardiovascular and Mediastinum   Hypertension    Stable. Continue current medications.        Endocrine   Diabetes mellitus (HCC) - Primary    A1c 7 today. At goal. Continue metformin. Continue exercise and weight loss.      Relevant Orders   POCT glycosylated hemoglobin (Hb A1C) (Completed)     Other   Hyperlipidemia with target LDL less than 70    At goal on Lipitor. Continue.        Follow-up: 6 months  Marytza Grandpre Adriana Simasook DO [facility]  [employer]

## 2016-12-07 NOTE — Assessment & Plan Note (Signed)
A1c 7 today. At goal. Continue metformin. Continue exercise and weight loss.

## 2016-12-14 DIAGNOSIS — M955 Acquired deformity of pelvis: Secondary | ICD-10-CM | POA: Diagnosis not present

## 2016-12-14 DIAGNOSIS — M9905 Segmental and somatic dysfunction of pelvic region: Secondary | ICD-10-CM | POA: Diagnosis not present

## 2016-12-14 DIAGNOSIS — M5432 Sciatica, left side: Secondary | ICD-10-CM | POA: Diagnosis not present

## 2016-12-14 DIAGNOSIS — M9903 Segmental and somatic dysfunction of lumbar region: Secondary | ICD-10-CM | POA: Diagnosis not present

## 2016-12-20 ENCOUNTER — Telehealth: Payer: Self-pay | Admitting: Cardiovascular Disease

## 2016-12-20 NOTE — Telephone Encounter (Signed)
Xarelto 20 mg tablet samples placed at front desk for pick up. 

## 2016-12-20 NOTE — Telephone Encounter (Signed)
Patient calling the office for samples of medication: ° ° °1.  What medication and dosage are you requesting samples for? xarelto 20mg ° °2.  Are you currently out of this medication? yes ° ° °

## 2017-01-04 ENCOUNTER — Encounter: Payer: Self-pay | Admitting: Family Medicine

## 2017-01-11 DIAGNOSIS — M9901 Segmental and somatic dysfunction of cervical region: Secondary | ICD-10-CM | POA: Diagnosis not present

## 2017-01-11 DIAGNOSIS — M5412 Radiculopathy, cervical region: Secondary | ICD-10-CM | POA: Diagnosis not present

## 2017-01-11 DIAGNOSIS — M5136 Other intervertebral disc degeneration, lumbar region: Secondary | ICD-10-CM | POA: Diagnosis not present

## 2017-01-11 DIAGNOSIS — M9903 Segmental and somatic dysfunction of lumbar region: Secondary | ICD-10-CM | POA: Diagnosis not present

## 2017-01-19 ENCOUNTER — Telehealth: Payer: Self-pay | Admitting: Cardiovascular Disease

## 2017-01-19 NOTE — Telephone Encounter (Signed)
Pt is aware that I have 1 bottle for here at the front for pick up. Pt mentioned she has 4-5 pills left. She will wait until drug rep drops [place] off more samples to stop by .

## 2017-01-19 NOTE — Telephone Encounter (Signed)
Patient calling the office for samples of medication:   1.  What medication and dosage are you requesting samples for? Xarelto 20 po daily   2.  Are you currently out of this medication? Almost

## 2017-01-20 NOTE — Telephone Encounter (Signed)
Error

## 2017-01-20 NOTE — Telephone Encounter (Signed)
Samples placed up front.  2 bottles of Xarelto 20MG  LOT # [id number] EXP: 12/20

## 2017-02-15 DIAGNOSIS — M5412 Radiculopathy, cervical region: Secondary | ICD-10-CM | POA: Diagnosis not present

## 2017-02-15 DIAGNOSIS — M9903 Segmental and somatic dysfunction of lumbar region: Secondary | ICD-10-CM | POA: Diagnosis not present

## 2017-02-15 DIAGNOSIS — M9901 Segmental and somatic dysfunction of cervical region: Secondary | ICD-10-CM | POA: Diagnosis not present

## 2017-02-15 DIAGNOSIS — M5136 Other intervertebral disc degeneration, lumbar region: Secondary | ICD-10-CM | POA: Diagnosis not present

## 2017-02-24 ENCOUNTER — Telehealth: Payer: Self-pay | Admitting: Cardiovascular Disease

## 2017-02-24 NOTE — Telephone Encounter (Signed)
Medication Samples have been provided to the patient.  Drug name: Xarelto       Strength:        Qty: 2 bottles LOT: [id number]  Exp.Date: 02/21   Margrett Rud 9:51 AM 02/24/2017

## 2017-02-24 NOTE — Telephone Encounter (Signed)
Patient calling the office for samples of medication:   1.  What medication and dosage are you requesting samples for? xarelto  2.  Are you currently out of this medication?  Only has tablets left   She is in donut hole

## 2017-03-08 ENCOUNTER — Other Ambulatory Visit: Payer: Self-pay | Admitting: Family Medicine

## 2017-03-08 DIAGNOSIS — E1159 Type 2 diabetes mellitus with other circulatory complications: Secondary | ICD-10-CM

## 2017-03-17 ENCOUNTER — Ambulatory Visit (INDEPENDENT_AMBULATORY_CARE_PROVIDER_SITE_OTHER): Payer: PPO | Admitting: *Deleted

## 2017-03-17 DIAGNOSIS — Z23 Encounter for immunization: Secondary | ICD-10-CM

## 2017-03-22 DIAGNOSIS — M9903 Segmental and somatic dysfunction of lumbar region: Secondary | ICD-10-CM | POA: Diagnosis not present

## 2017-03-22 DIAGNOSIS — M5412 Radiculopathy, cervical region: Secondary | ICD-10-CM | POA: Diagnosis not present

## 2017-03-22 DIAGNOSIS — M9901 Segmental and somatic dysfunction of cervical region: Secondary | ICD-10-CM | POA: Diagnosis not present

## 2017-03-22 DIAGNOSIS — M5136 Other intervertebral disc degeneration, lumbar region: Secondary | ICD-10-CM | POA: Diagnosis not present

## 2017-03-23 ENCOUNTER — Telehealth: Payer: Self-pay | Admitting: Cardiovascular Disease

## 2017-03-23 NOTE — Telephone Encounter (Signed)
Patient calling the office for samples of medication:   1.  What medication and dosage are you requesting samples for? XARELTO 20MG    2.  Are you currently out of this medication? Getting low but will be going on vacation and wants some to tide over until they return home

## 2017-03-24 NOTE — Telephone Encounter (Signed)
Made patient aware that we did not have any samples and not sure when we will get more. She stated she would call Monday (03/27/2017) to see if we had more.

## 2017-04-13 ENCOUNTER — Telehealth: Payer: Self-pay | Admitting: Cardiovascular Disease

## 2017-04-13 NOTE — Telephone Encounter (Signed)
Patient calling the office for samples of medication:   1.  What medication and dosage are you requesting samples for? xarelto  2.  Are you currently out of this medication?  Only has 2 left

## 2017-04-13 NOTE — Telephone Encounter (Signed)
LMOVM to contact office concerning Patient Assistance.

## 2017-04-13 NOTE — Telephone Encounter (Signed)
Please PA form upfront for patient pick up.

## 2017-04-26 DIAGNOSIS — M9903 Segmental and somatic dysfunction of lumbar region: Secondary | ICD-10-CM | POA: Diagnosis not present

## 2017-04-26 DIAGNOSIS — M9901 Segmental and somatic dysfunction of cervical region: Secondary | ICD-10-CM | POA: Diagnosis not present

## 2017-04-26 DIAGNOSIS — M5136 Other intervertebral disc degeneration, lumbar region: Secondary | ICD-10-CM | POA: Diagnosis not present

## 2017-04-26 DIAGNOSIS — M5412 Radiculopathy, cervical region: Secondary | ICD-10-CM | POA: Diagnosis not present

## 2017-05-01 DIAGNOSIS — H401492 Capsular glaucoma with pseudoexfoliation of lens, unspecified eye, moderate stage: Secondary | ICD-10-CM | POA: Diagnosis not present

## 2017-05-01 LAB — HM DIABETES EYE EXAM

## 2017-05-02 ENCOUNTER — Encounter: Payer: Self-pay | Admitting: Family Medicine

## 2017-05-26 DIAGNOSIS — M5412 Radiculopathy, cervical region: Secondary | ICD-10-CM | POA: Diagnosis not present

## 2017-05-26 DIAGNOSIS — M5136 Other intervertebral disc degeneration, lumbar region: Secondary | ICD-10-CM | POA: Diagnosis not present

## 2017-05-26 DIAGNOSIS — M9903 Segmental and somatic dysfunction of lumbar region: Secondary | ICD-10-CM | POA: Diagnosis not present

## 2017-05-26 DIAGNOSIS — M9901 Segmental and somatic dysfunction of cervical region: Secondary | ICD-10-CM | POA: Diagnosis not present

## 2017-05-29 ENCOUNTER — Telehealth: Payer: Self-pay | Admitting: Family Medicine

## 2017-05-29 MED ORDER — DESONIDE 0.05 % EX CREA: 1.0000 "application " | TOPICAL_CREAM | CUTANEOUS | 1 refills | Status: DC | PRN

## 2017-05-29 NOTE — Telephone Encounter (Signed)
Please advise on refill.

## 2017-05-29 NOTE — Telephone Encounter (Signed)
Copied from CRM [phone]. Topic: Quick Communication - Rx Refill/Question >> May 29, 2017 10:25 AM Alexander BergeronBarksdale, Harvey B wrote: Pt called b/c the pharmacy has been contacted and a request apparently has been sent for the Rx of desonide (DESOWEN) 0.05 % cream [[id number]][[id number]], pt is needing the Rx filled before the end of year for insurance purposes, but no contact has been made to let pt know that drug is ready, contact pt to advise or pharmacy

## 2017-05-29 NOTE — Telephone Encounter (Signed)
Please advise 

## 2017-05-29 NOTE — Telephone Encounter (Signed)
Patient states she uses this for rosacea and as needed a couple times a week.Patient states she does not use this around her eyes

## 2017-05-29 NOTE — Telephone Encounter (Signed)
Sent to pharmacy. Please confirm that she takes this for her rosacea and see how often she uses this. Please also make sure she is not using this around her eyes as it appears she has a history of glaucoma. Thanks.

## 2017-05-29 NOTE — Telephone Encounter (Signed)
Noted. Thanks.

## 2017-06-12 ENCOUNTER — Ambulatory Visit (INDEPENDENT_AMBULATORY_CARE_PROVIDER_SITE_OTHER): Payer: PPO | Admitting: Family Medicine

## 2017-06-12 ENCOUNTER — Encounter: Payer: Self-pay | Admitting: Family Medicine

## 2017-06-12 ENCOUNTER — Ambulatory Visit: Payer: Self-pay | Admitting: Family Medicine

## 2017-06-12 ENCOUNTER — Other Ambulatory Visit: Payer: Self-pay

## 2017-06-12 VITALS — BP 154/80 | HR 81 | Temp 97.4°F | Wt 237.8 lb

## 2017-06-12 DIAGNOSIS — H6120 Impacted cerumen, unspecified ear: Secondary | ICD-10-CM | POA: Insufficient documentation

## 2017-06-12 DIAGNOSIS — H6121 Impacted cerumen, right ear: Secondary | ICD-10-CM

## 2017-06-12 DIAGNOSIS — R05 Cough: Secondary | ICD-10-CM | POA: Insufficient documentation

## 2017-06-12 DIAGNOSIS — E1159 Type 2 diabetes mellitus with other circulatory complications: Secondary | ICD-10-CM | POA: Diagnosis not present

## 2017-06-12 DIAGNOSIS — I10 Essential (primary) hypertension: Secondary | ICD-10-CM

## 2017-06-12 DIAGNOSIS — I480 Paroxysmal atrial fibrillation: Secondary | ICD-10-CM | POA: Diagnosis not present

## 2017-06-12 DIAGNOSIS — R059 Cough, unspecified: Secondary | ICD-10-CM | POA: Insufficient documentation

## 2017-06-12 LAB — COMPREHENSIVE METABOLIC PANEL
ALT: 44 U/L — ABNORMAL HIGH (ref 0–35)
AST: 36 U/L (ref 0–37)
Albumin: 4.5 g/dL (ref 3.5–5.2)
Alkaline Phosphatase: 65 U/L (ref 39–117)
BUN: 12 mg/dL (ref 6–23)
CALCIUM: 9.8 mg/dL (ref 8.4–10.5)
CHLORIDE: 101 meq/L (ref 96–112)
CO2: 29 meq/L (ref 19–32)
Creatinine, Ser: 0.77 mg/dL (ref 0.40–1.20)
GFR: 77.79 mL/min (ref >60.00)
Glucose, Bld: 186 mg/dL — ABNORMAL HIGH (ref 70–99)
POTASSIUM: 4 meq/L (ref 3.5–5.1)
Sodium: 139 mEq/L (ref 135–145)
Total Bilirubin: 0.8 mg/dL (ref 0.2–1.2)
Total Protein: 7.6 g/dL (ref 6.0–8.3)

## 2017-06-12 LAB — CBC
HCT: 42.8 % (ref 36.0–46.0)
HEMOGLOBIN: 14.4 g/dL (ref 12.0–15.0)
MCHC: 33.6 g/dL (ref 30.0–36.0)
MCV: 88.8 fl (ref 78.0–100.0)
PLATELETS: 179 10*3/uL (ref 150.0–400.0)
RBC: 4.82 Mil/uL (ref 3.87–5.11)
RDW: 12.7 % (ref 11.5–15.5)
WBC: 5.3 10*3/uL (ref 4.0–10.5)

## 2017-06-12 LAB — HEMOGLOBIN A1C: HEMOGLOBIN A1C: 8.3 % — AB (ref 4.6–6.5)

## 2017-06-12 LAB — MICROALBUMIN / CREATININE URINE RATIO
CREATININE, U: 213.1 mg/dL
MICROALB UR: 7.9 mg/dL — AB (ref 0.0–1.9)
Microalb Creat Ratio: 3.7 mg/g (ref 0.0–30.0)

## 2017-06-12 NOTE — Progress Notes (Signed)
Gabriela Rumps, MD Phone: [phone]  Gabriela Reed is a 77 y.o. female who presents today for follow-up.  Hypertension: Typically 130s/70s.  Taking HCTZ.  Does take metoprolol daily.  No chest pain, shortness of breath, or edema.  Diabetes: Typically between 130 and 150 though excursions to 170.  That depends on what she eats.  She does take metformin.  When her sugar is up she does note some polyuria.  No hypoglycemia.  She has not been exercising as much over the holidays.  A. fib: Taking Xarelto.  She does note occasional episodes of palpitations.  She has seen her cardiologist for this in the past and they did recommend taking diltiazem or propranolol as needed.  The diltiazem does not work quite as well.  The propranolol works fairly well.  She does take metoprolol.  She notes no excessive bleeding.  No recent episodes of palpitations.  She reports she developed cough in December and this went away though recurred in January.  She has had some nasal congestion and rhinorrhea.  No significant sinus congestion.  She feels the need to clear her throat.  She notes no reflux symptoms.  No pain in her throat.  No trouble swallowing.  No trouble breathing.  No history of allergies.  No fevers.  She requested that I check her ears that she has had cerumen impaction previously.  She notes a little bit of trouble hearing at times.  Social History   Tobacco Use  Smoking Status Former Smoker  Smokeless Tobacco Never Used  Tobacco Comment   quit 25 years ago     ROS see history of present illness  Objective  Physical Exam Vitals:   06/12/17 0902 06/12/17 0926  BP: (!) 160/80 (!) 154/80  Pulse: 81   Temp: (!) 97.4 F (36.3 C)   SpO2: 98%     BP Readings from Last 3 Encounters:  06/12/17 (!) 154/80  12/07/16 (!) 142/84  10/10/16 (!) 160/86   Wt Readings from Last 3 Encounters:  06/12/17 237 lb 12.8 oz (107.9 kg)  12/07/16 230 lb 2 oz (104.4 kg)  10/10/16 235 lb 8 oz  (106.8 kg)    Physical Exam  Constitutional: No distress.  HENT:  Head: Normocephalic and atraumatic.  Mouth/Throat: Oropharynx is clear and moist. No oropharyngeal exudate.  Left TM normal, right TM obscured by cerumen, normal TM on the right after irrigation  Eyes: Conjunctivae are normal. Pupils are equal, round, and reactive to light.  Cardiovascular: Normal rate, regular rhythm and normal heart sounds.  Pulmonary/Chest: Effort normal and breath sounds normal.  Musculoskeletal: She exhibits no edema.  Neurological: She is alert. Gait normal.  Skin: Skin is warm and dry. She is not diaphoretic.     Assessment/Plan: Please see individual problem list.  Hypertension Above goal in the office though at goal at home.  She will continue to monitor.  Continue current regimen.  Check lab work.  Paroxysmal atrial fibrillation (HCC) Relatively stable.  The as needed propranolol has been working well.  She reports she is awaiting a call for her next appointment with cardiology.  She will keep that.  The likely need to consider antiarrhythmics at some point.  Continue Xarelto.  Check CBC.  Diabetes mellitus ([place]) Seems to be relatively well controlled.  We will check an A1c.  Continue metformin.  Cough Patient with intermittent cough associated with nasal congestion.  I suspect it seems as though the cough is related to mild throat irritation.  No  reflux symptoms.  Benign lung exam.  Suspect likely postnasal drip contributing to this.  She can try Claritin for this.  If not improving in the next 2 weeks she will contact us and we will refer to ENT.  Cerumen impaction Possibly contributing to hearing difficulty.  Normal TMs noted.  If her hearing is not improved with removal of wax she will let us know we can refer to ENT.   Orders Placed This Encounter  Procedures  . Urine Microalbumin w/creat. ratio  . Comp Met (CMET)  . HgB A1c  . CBC    No orders of the defined types were placed  in this encounter.  Health maintenance: Patient declined mammography.  I did recommend this.  Gabriela Rumps, MD [place]

## 2017-06-12 NOTE — Assessment & Plan Note (Addendum)
Patient with intermittent cough associated with nasal congestion.  I suspect it seems as though the cough is related to mild throat irritation.  No reflux symptoms.  Benign lung exam.  Suspect likely postnasal drip contributing to this.  She can try Claritin for this.  If not improving in the next 2 weeks she will contact us and we will refer to ENT.

## 2017-06-12 NOTE — Assessment & Plan Note (Signed)
Relatively stable.  The as needed propranolol has been working well.  She reports she is awaiting a call for her next appointment with cardiology.  She will keep that.  The likely need to consider antiarrhythmics at some point.  Continue Xarelto.  Check CBC.

## 2017-06-12 NOTE — Patient Instructions (Addendum)
Nice to see you. Please continue to monitor your blood pressure. Please try Claritin to see if that will help with your throat clearing sensation.  If this does not help in the next 2 weeks please contact us. We will contact you with your lab results.

## 2017-06-12 NOTE — Assessment & Plan Note (Signed)
Seems to be relatively well controlled.  We will check an A1c.  Continue metformin.

## 2017-06-12 NOTE — Assessment & Plan Note (Signed)
Possibly contributing to hearing difficulty.  Normal TMs noted.  If her hearing is not improved with removal of wax she will let us know we can refer to ENT.

## 2017-06-12 NOTE — Assessment & Plan Note (Signed)
Above goal in the office though at goal at home.  She will continue to monitor.  Continue current regimen.  Check lab work.

## 2017-06-23 DIAGNOSIS — M9901 Segmental and somatic dysfunction of cervical region: Secondary | ICD-10-CM | POA: Diagnosis not present

## 2017-06-23 DIAGNOSIS — M9903 Segmental and somatic dysfunction of lumbar region: Secondary | ICD-10-CM | POA: Diagnosis not present

## 2017-06-23 DIAGNOSIS — M5136 Other intervertebral disc degeneration, lumbar region: Secondary | ICD-10-CM | POA: Diagnosis not present

## 2017-06-23 DIAGNOSIS — M5412 Radiculopathy, cervical region: Secondary | ICD-10-CM | POA: Diagnosis not present

## 2017-06-30 ENCOUNTER — Other Ambulatory Visit: Payer: Self-pay | Admitting: Cardiovascular Disease

## 2017-07-18 ENCOUNTER — Telehealth: Payer: Self-pay | Admitting: Family Medicine

## 2017-07-18 NOTE — Telephone Encounter (Signed)
Ok to switch   TMs

## 2017-07-18 NOTE — Telephone Encounter (Signed)
This is fine with me. I will forward to Dr McLean-Scocuzza.

## 2017-07-18 NOTE — Telephone Encounter (Signed)
Copied from CRM [phone]. Topic: General - Other >> Jul 18, 2017  2:32 PM Gabriela Reed, Gabriela Reed, RMA wrote: Reason for CRM: pt called and requested to be transferred to [place]oc[place] she stated that she had requested a female physician a while ago and was told that when a female became available that she would be contacted and never was. She stated that she has an [facility] and would like to know if she can switch before then Please call [phone]

## 2017-07-18 NOTE — Telephone Encounter (Signed)
Please advise 

## 2017-07-19 NOTE — Telephone Encounter (Signed)
PCP has been changed from Dr. Birdie SonsSonnenberg to Dr. Shirlee LatchMcLean.

## 2017-07-19 NOTE — Telephone Encounter (Signed)
The patient is aware her follow up will be with Dr. Shirlee LatchMcLean on 4.18.19 @ 8:30.

## 2017-07-26 DIAGNOSIS — M5412 Radiculopathy, cervical region: Secondary | ICD-10-CM | POA: Diagnosis not present

## 2017-07-26 DIAGNOSIS — M5136 Other intervertebral disc degeneration, lumbar region: Secondary | ICD-10-CM | POA: Diagnosis not present

## 2017-07-26 DIAGNOSIS — M9901 Segmental and somatic dysfunction of cervical region: Secondary | ICD-10-CM | POA: Diagnosis not present

## 2017-07-26 DIAGNOSIS — M9903 Segmental and somatic dysfunction of lumbar region: Secondary | ICD-10-CM | POA: Diagnosis not present

## 2017-08-23 DIAGNOSIS — M5136 Other intervertebral disc degeneration, lumbar region: Secondary | ICD-10-CM | POA: Diagnosis not present

## 2017-08-23 DIAGNOSIS — M5412 Radiculopathy, cervical region: Secondary | ICD-10-CM | POA: Diagnosis not present

## 2017-08-23 DIAGNOSIS — M9901 Segmental and somatic dysfunction of cervical region: Secondary | ICD-10-CM | POA: Diagnosis not present

## 2017-08-23 DIAGNOSIS — M9903 Segmental and somatic dysfunction of lumbar region: Secondary | ICD-10-CM | POA: Diagnosis not present

## 2017-08-29 DIAGNOSIS — H401492 Capsular glaucoma with pseudoexfoliation of lens, unspecified eye, moderate stage: Secondary | ICD-10-CM | POA: Diagnosis not present

## 2017-09-06 ENCOUNTER — Telehealth: Payer: Self-pay | Admitting: Internal Medicine

## 2017-09-06 ENCOUNTER — Telehealth: Payer: Self-pay | Admitting: *Deleted

## 2017-09-06 DIAGNOSIS — E1159 Type 2 diabetes mellitus with other circulatory complications: Secondary | ICD-10-CM

## 2017-09-06 MED ORDER — METFORMIN HCL ER 500 MG PO TB24: 1000.0000 mg | ORAL_TABLET | Freq: Two times a day (BID) | ORAL | 1 refills | Status: DC

## 2017-09-06 MED ORDER — HYDROCHLOROTHIAZIDE 25 MG PO TABS: 25.0000 mg | ORAL_TABLET | Freq: Every day | ORAL | 3 refills | Status: DC

## 2017-09-06 NOTE — Telephone Encounter (Signed)
Copied from CRM #[id number]. Topic: Inquiry °>> Sep 06, 2017  8:22 AM Robinson, Andra M wrote: °Reason for CRM: Patient called requesting refills for both Hydrochlorothiazide (HYDRODIURIL) 25 MG tablet and MetFORMIN (GLUCOPHAGE-XR) 500 MG 24 hr tablet. Patient is completely out of both medications. Patient's preferred pharmacy is [phone] - , [place] - [address] [phone] (Phone)  [phone] (Fax).       Thank You!!! ° ° ° ° ° ° ° ° ° °

## 2017-09-06 NOTE — Telephone Encounter (Signed)
Copied from CRM [phone]. Topic: Inquiry >> Sep 06, 2017  8:22 AM Gabriela Reed, Andra M wrote: Reason for CRM: Patient called requesting refills for both Hydrochlorothiazide (HYDRODIURIL) 25 MG tablet and MetFORMIN (GLUCOPHAGE-XR) 500 MG 24 hr tablet. Patient is completely out of both medications. Patient's preferred pharmacy is [facility] [address] - [place], [place] - [id number] [phone] (Phone)  [phone] (Fax).       Thank You!!!

## 2017-09-06 NOTE — Telephone Encounter (Signed)
medication has been refilled. 

## 2017-09-14 ENCOUNTER — Encounter: Payer: Self-pay | Admitting: Internal Medicine

## 2017-09-14 ENCOUNTER — Ambulatory Visit (INDEPENDENT_AMBULATORY_CARE_PROVIDER_SITE_OTHER): Payer: PPO | Admitting: Internal Medicine

## 2017-09-14 ENCOUNTER — Other Ambulatory Visit: Payer: Self-pay | Admitting: Internal Medicine

## 2017-09-14 ENCOUNTER — Ambulatory Visit: Payer: Self-pay | Admitting: Family Medicine

## 2017-09-14 ENCOUNTER — Telehealth: Payer: Self-pay | Admitting: Internal Medicine

## 2017-09-14 VITALS — BP 132/78 | HR 62 | Temp 98.2°F | Ht 64.0 in | Wt 231.4 lb

## 2017-09-14 DIAGNOSIS — I10 Essential (primary) hypertension: Secondary | ICD-10-CM | POA: Diagnosis not present

## 2017-09-14 DIAGNOSIS — R946 Abnormal results of thyroid function studies: Secondary | ICD-10-CM

## 2017-09-14 DIAGNOSIS — L719 Rosacea, unspecified: Secondary | ICD-10-CM | POA: Insufficient documentation

## 2017-09-14 DIAGNOSIS — K760 Fatty (change of) liver, not elsewhere classified: Secondary | ICD-10-CM

## 2017-09-14 DIAGNOSIS — Z23 Encounter for immunization: Secondary | ICD-10-CM

## 2017-09-14 DIAGNOSIS — E119 Type 2 diabetes mellitus without complications: Secondary | ICD-10-CM | POA: Diagnosis not present

## 2017-09-14 DIAGNOSIS — R7989 Other specified abnormal findings of blood chemistry: Secondary | ICD-10-CM

## 2017-09-14 LAB — COMPREHENSIVE METABOLIC PANEL
ALT: 25 U/L (ref 0–35)
AST: 24 U/L (ref 0–37)
Albumin: 4.1 g/dL (ref 3.5–5.2)
Alkaline Phosphatase: 52 U/L (ref 39–117)
BUN: 18 mg/dL (ref 6–23)
CHLORIDE: 102 meq/L (ref 96–112)
CO2: 26 meq/L (ref 19–32)
Calcium: 9.5 mg/dL (ref 8.4–10.5)
Creatinine, Ser: 0.77 mg/dL (ref 0.40–1.20)
GFR: 77.73 mL/min (ref >60.00)
GLUCOSE: 153 mg/dL — AB (ref 70–99)
POTASSIUM: 3.6 meq/L (ref 3.5–5.1)
SODIUM: 139 meq/L (ref 135–145)
Total Bilirubin: 0.7 mg/dL (ref 0.2–1.2)
Total Protein: 7 g/dL (ref 6.0–8.3)

## 2017-09-14 LAB — LIPID PANEL
CHOLESTEROL: 106 mg/dL (ref 0–200)
HDL: 38.9 mg/dL — AB (ref >39.00)
LDL Cholesterol: 43 mg/dL (ref 0–99)
NonHDL: 67.48
TRIGLYCERIDES: 124 mg/dL (ref 0.0–149.0)
Total CHOL/HDL Ratio: 3
VLDL: 24.8 mg/dL (ref 0.0–40.0)

## 2017-09-14 LAB — HEMOGLOBIN A1C: Hgb A1c MFr Bld: 7.9 % — ABNORMAL HIGH (ref 4.6–6.5)

## 2017-09-14 LAB — T4, FREE: Free T4: 0.77 ng/dL (ref 0.60–1.60)

## 2017-09-14 LAB — TSH: TSH: 4.94 u[IU]/mL — ABNORMAL HIGH (ref 0.35–4.50)

## 2017-09-14 MED ORDER — METRONIDAZOLE 1 % EX GEL: Freq: Every day | CUTANEOUS | 2 refills | Status: DC

## 2017-09-14 MED ORDER — SITAGLIPTIN PHOSPHATE 100 MG PO TABS: 100.0000 mg | ORAL_TABLET | Freq: Every day | ORAL | 1 refills | Status: DC

## 2017-09-14 NOTE — Telephone Encounter (Signed)
Pt states she reviewed lab results on MyChart; verbalizes understanding.  Appt made for 09/18/17 at 1400 for Thyroid Peroxidase Antibody.

## 2017-09-14 NOTE — Progress Notes (Signed)
Chief Complaint  Patient presents with  . Follow-up   F/u 1. DM cbgs 140s-178 in am on metformin 1000 MG XR bid and wants to add back something else last A1C 8.3 but having highs 2. Rosacea using Desowen x years but still bothered by purple color to face at times has to wear make up  3. AF rate controlled appt with cards 09/2017  4. HTN BP controlled today on Toprol 100 XL, hctz 25 mg qd, dilt 30 mg tid  5. 2016 abnormal thyroid function tests will fu   Review of Systems  Constitutional: Negative for weight loss.  HENT: Negative for hearing loss.   Eyes: Negative for blurred vision.  Respiratory: Negative for shortness of breath.   Cardiovascular: Negative for chest pain.  Gastrointestinal: Negative for abdominal pain, nausea and vomiting.   Past Medical History:  Diagnosis Date  . Chicken pox   . Coronary artery disease    a. s/p DES to mid-LAD in 2004, cath in 2006 showing patient stent with moderate CAD in the distal LAD  . DM (diabetes mellitus), type 2 (HCC)   . Glaucoma   . Heart murmur   . Heart palpitations   . Hyperlipidemia   . Hypertension   . PAF (paroxysmal atrial fibrillation) (HCC)    a. diagnosed in 10/2014 b. quit taking Eliquis in 01/2015 due to myalgias. Refuses to be on anticoagulation   Past Surgical History:  Procedure Laterality Date  . CARDIAC CATHETERIZATION     [place] x1 stent  . coronary artery stent  2004  . TONSILLECTOMY AND ADENOIDECTOMY  1949   Family History  Problem Relation Age of Onset  . Hyperlipidemia Mother   . Hypertension Mother   . Hyperlipidemia Father   . Hypertension Father   . Heart disease Father 9  . Heart attack Father    Social History   Socioeconomic History  . Marital status: Divorced    Spouse name: Not on file  . Number of children: Not on file  . Years of education: Not on file  . Highest education level: Not on file  Occupational History  . Not on file  Social Needs  . Financial resource strain: Not  on file  . Food insecurity:    Worry: Not on file    Inability: Not on file  . Transportation needs:    Medical: Not on file    Non-medical: Not on file  Tobacco Use  . Smoking status: Former Games developer  . Smokeless tobacco: Never Used  . Tobacco comment: quit 25 years ago  Substance and Sexual Activity  . Alcohol use: Yes    Alcohol/week: 0.0 oz    Comment: very rarely  . Drug use: No  . Sexual activity: Not on file  Lifestyle  . Physical activity:    Days per week: Not on file    Minutes per session: Not on file  . Stress: Not on file  Relationships  . Social connections:    Talks on phone: Not on file    Gets together: Not on file    Attends religious service: Not on file    Active member of club or organization: Not on file    Attends meetings of clubs or organizations: Not on file    Relationship status: Not on file  . Intimate partner violence:    Fear of current or ex partner: Not on file    Emotionally abused: Not on file    Physically abused: Not  on file    Forced sexual activity: Not on file  Other Topics Concern  . Not on file  Social History Narrative   Lives in [place]. From [place] and moved here for her daughter, granddaughter. in home.      Work - retired.   Diet - regular   Exercise - [employer]   Current Meds  Medication Sig  . ACCU-CHEK SOFTCLIX LANCETS lancets Use as instructed with Accu Check One Touch Ultra Mini glucometer to test blood sugars twice daily.  Dx E11.9  . atorvastatin (LIPITOR) 20 MG tablet Take 1 tablet (20 mg total) by mouth at bedtime.  Marland Kitchen desonide (DESOWEN) 0.05 % cream Apply 1 application topically as needed (for rosacea).  . diltiazem (CARDIZEM) 30 MG tablet Take 1 tablet (30 mg total) by mouth 3 (three) times daily as needed.  Marland Kitchen glucose blood (ONE TOUCH ULTRA TEST) test strip Use up to 4 times daily to check blood sugar.  Marland Kitchen glucose blood test strip 1 each by Other route as needed for other. Use as instructed with Accu Check One  Touch Ultra Mini glucometer to test blood sugars twice daily.  Dx E11.9  . hydrochlorothiazide (HYDRODIURIL) 25 MG tablet Take 1 tablet (25 mg total) by mouth daily.  . metFORMIN (GLUCOPHAGE-XR) 500 MG 24 hr tablet Take 2 tablets (1,000 mg total) by mouth 2 (two) times daily.  . metoprolol succinate (TOPROL-XL) 100 MG 24 hr tablet Take 1 tablet (100 mg total) by mouth daily. Take with or immediately following a meal.  . ONETOUCH DELICA LANCETS 33G MISC Use up to 4 times daily to check blood sugar.  . propranolol (INDERAL) 20 MG tablet Take 1 tablet (20 mg total) by mouth 3 (three) times daily as needed.  Carlena Hurl 20 MG TABS tablet TAKE ONE TABLET BY MOUTH ONCE DAILY WITH  SUPPER   Allergies  Allergen Reactions  . Niaspan [Niacin Er] Rash  . Lisinopril Cough   No results found for this or any previous visit (from the past 2160 hour(s)). Objective  Body mass index is 39.72 kg/m. Wt Readings from Last 3 Encounters:  09/14/17 231 lb 6.4 oz (105 kg)  06/12/17 237 lb 12.8 oz (107.9 kg)  12/07/16 230 lb 2 oz (104.4 kg)   Temp Readings from Last 3 Encounters:  09/14/17 98.2 F (36.8 C) (Oral)  06/12/17 (!) 97.4 F (36.3 C) (Oral)  12/07/16 98.2 F (36.8 C) (Oral)   BP Readings from Last 3 Encounters:  09/14/17 132/78  06/12/17 (!) 154/80  12/07/16 (!) 142/84   Pulse Readings from Last 3 Encounters:  09/14/17 62  06/12/17 81  12/07/16 74    Physical Exam  Constitutional: She is oriented to person, place, and time. Vital signs are normal. She appears well-developed and well-nourished.  HENT:  Head: Normocephalic and atraumatic.  Mouth/Throat: Oropharynx is clear and moist and mucous membranes are normal.  Eyes: Pupils are equal, round, and reactive to light. Conjunctivae are normal.  Cardiovascular: Normal rate. An irregularly irregular rhythm present.  In Afib  Pulmonary/Chest: Effort normal and breath sounds normal.  Neurological: She is alert and oriented to person, place,  and time. Gait normal.  Skin: Skin is warm, dry and intact.  Psychiatric: She has a normal mood and affect. Her speech is normal and behavior is normal. Judgment and thought content normal. Cognition and memory are normal.  Nursing note and vitals reviewed.   Assessment   1. DM 2 last A1C 8.3 with hyperglycemia  2. HTN controlled  3. Rosacea 4. Afib today rate controlled  5. HM 6. Abnormal tfts  Plan  1. Cont 1000 mg bid metformin  Add januvia 100 mg qd  F/u in 3-4 months  Labs today CMET, lipid A1C  2. Cont meds  3. Add metrogel  4. F/u cards 09/2017  5.  Had flu shot  prevnar had  Given pna 32 today  Zoster had, declines shingrix, Tdap   DEXA 04/07/16 neg, colonoscopy had, out of age age window pap, mammo declines  rec calcium 600 mg bid, vit D 3 1000 IU qd  6. Check TSH, T4 today  Provider: Dr. French Anaracy McLean-[facility]oc[facility]

## 2017-09-14 NOTE — Progress Notes (Signed)
Pre visit review using our clinic review tool, if applicable. No additional management support is needed unless otherwise documented below in the visit note. 

## 2017-09-14 NOTE — Patient Instructions (Addendum)
F/u in 3-4 months with PCP  Vitamin D3 1000 IU daily and calcium 600 mg 2x per day   Pneumococcal Polysaccharide Vaccine: What You Need to Know 1. Why get vaccinated? Vaccination can protect older adults (and some children and younger adults) from pneumococcal disease. Pneumococcal disease is caused by bacteria that can spread from person to person through close contact. It can cause ear infections, and it can also lead to more serious infections of the:  Lungs (pneumonia),  Blood (bacteremia), and  Covering of the brain and spinal cord (meningitis). Meningitis can cause deafness and brain damage, and it can be fatal.  Anyone can get pneumococcal disease, but children under 49 years of age, people with certain medical conditions, adults over 46 years of age, and cigarette smokers are at the highest risk. About 18,000 older adults die each year from pneumococcal disease in the [place]. Treatment of pneumococcal infections with penicillin and other drugs used to be more effective. But some strains of the disease have become resistant to these drugs. This makes prevention of the disease, through vaccination, even more important. 2. Pneumococcal polysaccharide vaccine (PPSV23) Pneumococcal polysaccharide vaccine (PPSV23) protects against 23 types of pneumococcal bacteria. It will not prevent all pneumococcal disease. PPSV23 is recommended for:  All adults 53 years of age and older,  Anyone 2 through 75 years of age with certain long-term health problems,  Anyone 2 through 75 years of age with a weakened immune system,  Adults 41 through 75 years of age who smoke cigarettes or have asthma.  Most people need only one dose of PPSV. A second dose is recommended for certain high-risk groups. People 102 and older should get a dose even if they have gotten one or more doses of the vaccine before they turned 65. Your healthcare provider can give you more information about these  recommendations. Most healthy adults develop protection within 2 to 3 weeks of getting the shot. 3. Some people should not get this vaccine  Anyone who has had a life-threatening allergic reaction to PPSV should not get another dose.  Anyone who has a severe allergy to any component of PPSV should not receive it. Tell your provider if you have any severe allergies.  Anyone who is moderately or severely ill when the shot is scheduled may be asked to wait until they recover before getting the vaccine. Someone with a mild illness can usually be vaccinated.  Children less than 36 years of age should not receive this vaccine.  There is no evidence that PPSV is harmful to either a pregnant woman or to her fetus. However, as a precaution, women who need the vaccine should be vaccinated before becoming pregnant, if possible. 4. Risks of a vaccine reaction With any medicine, including vaccines, there is a chance of side effects. These are usually mild and go away on their own, but serious reactions are also possible. About half of people who get PPSV have mild side effects, such as redness or pain where the shot is given, which go away within about two days. Less than 1 out of 100 people develop a fever, muscle aches, or more severe local reactions. Problems that could happen after any vaccine:  People sometimes faint after a medical procedure, including vaccination. Sitting or lying down for about 15 minutes can help prevent fainting, and injuries caused by a fall. Tell your doctor if you feel dizzy, or have vision changes or ringing in the ears.  Some people get severe  pain in the shoulder and have difficulty moving the arm where a shot was given. This happens very rarely.  Any medication can cause a severe allergic reaction. Such reactions from a vaccine are very rare, estimated at about 1 in a million doses, and would happen within a few minutes to a few hours after the vaccination. As with any  medicine, there is a very remote chance of a vaccine causing a serious injury or death. The safety of vaccines is always being monitored. For more information, visit: http://floyd.org/www.cdc.gov/vaccinesafety/ 5. What if there is a serious reaction? What should I look for? Look for anything that concerns you, such as signs of a severe allergic reaction, very high fever, or unusual behavior. Signs of a severe allergic reaction can include hives, swelling of the face and throat, difficulty breathing, a fast heartbeat, dizziness, and weakness. These would usually start a few minutes to a few hours after the vaccination. What should I do? If you think it is a severe allergic reaction or other emergency that can't wait, call 9-1-1 or get to the nearest hospital. Otherwise, call your doctor. Afterward, the reaction should be reported to the Vaccine Adverse Event Reporting System (VAERS). Your doctor might file this report, or you can do it yourself through the VAERS web site at www.vaers.LAgents.nohhs.gov, or by calling 1-9890497300. VAERS does not give medical advice. 6. How can I learn more?  Ask your doctor. He or she can give you the vaccine package insert or suggest other sources of information.  Call your local or state health department.  Contact the Centers for Disease Control and Prevention (CDC): ? Call 626-115-16261-219-712-3135 (1-800-CDC-INFO) or ? Visit CDC's website at PicCapture.uywww.cdc.gov/vaccines CDC Pneumococcal Polysaccharide Vaccine VIS (09/20/13) This information is not intended to replace advice given to you by your health care provider. Make sure you discuss any questions you have with your health care provider. Document Released: 03/13/2006 Document Revised: 02/04/2016 Document Reviewed: 02/04/2016 Elsevier Interactive Patient Education  2017 Elsevier Inc.  Rosacea Rosacea is a long-term (chronic) condition that affects the skin of the face, including the cheeks, nose, brow, and chin. This condition can also affect  the eyes. Rosacea causes blood vessels near the surface of the skin to enlarge, which results in redness. What are the causes? The cause of this condition is not known. Certain triggers can make rosacea worse, including:  Hot baths.  Exercise.  Sunlight.  Very hot or cold temperatures.  Hot or spicy foods and drinks.  Drinking alcohol.  Stress.  Taking blood pressure medicine.  Long-term use of topical steroids on the face.  What increases the risk? This condition is more likely to develop in:  People who are older than 75 years of age.  Women.  People who have light-colored skin (light complexion).  People who have a family history of rosacea.  What are the signs or symptoms? Symptoms of this condition include:  Redness of the face.  Red bumps or pimples on the face.  A red, enlarged nose.  Blushing easily.  Red lines on the skin.  Irritated or burning feeling in the eyes.  Swollen eyelids.  How is this diagnosed? This condition is diagnosed with a medical history and physical exam. How is this treated? There is no cure for this condition, but treatment can help to control your symptoms. Your health care provider may recommend that you see a skin specialist (dermatologist). Treatment may include:  Antibiotic medicines that are applied to the skin or taken  as a pill.  Laser treatment to improve the appearance of the skin.  Surgery. This is rare.  Your health care provider will also recommend the best way to take care of your skin. Even after your skin improves, you will likely need to continue treatment to prevent your rosacea from coming back. Follow these instructions at home: Skin Care Take care of your skin as told by your health care provider. You may be told to do these things:  Wash your skin gently two or more times each day.  Use mild soap.  Use a sunscreen or sunblock with SPF 30 or greater.  Use gentle cosmetics that are meant for  sensitive skin.  Shave with an electric shaver instead of a blade.  Lifestyle  Try to keep track of what foods trigger this condition. Avoid any triggers. These may include: ? Spicy foods. ? Seafood. ? Cheese. ? Hot liquids. ? Nuts. ? Chocolate. ? Iodized salt.  Do not drink alcohol.  Avoid extremely cold or hot temperatures.  Try to reduce your stress. If you need help, talk with your health care provider.  When you exercise, do these things to stay cool: ? Limit your sun exposure. ? Use a fan. ? Do shorter and more frequent intervals of exercise. General instructions  Keep all follow-up visits as told by your health care provider. This is important.  Take over-the-counter and prescription medicines only as told by your health care provider.  If your eyelids are affected, apply warm compresses to them. Do this as told by your health care provider.  If you were prescribed an antibiotic medicine, apply or take it as told by your health care provider. Do not stop using the antibiotic even if your condition improves. Contact a health care provider if:  Your symptoms get worse.  Your symptoms do not improve after two months of treatment.  You have new symptoms.  You have any changes in vision or you have problems with your eyes, such as redness or itching.  You feel depressed.  You lose your appetite.  You have trouble concentrating. This information is not intended to replace advice given to you by your health care provider. Make sure you discuss any questions you have with your health care provider. Document Released: 06/23/2004 Document Revised: 10/22/2015 Document Reviewed: 07/23/2014 Elsevier Interactive Patient Education  [employer].

## 2017-09-14 NOTE — Telephone Encounter (Signed)
Copied from CRM [phone]. Topic: Quick Communication - Lab Results >> Sep 14, 2017  3:49 PM Bronwen BettersBooth, Brock T, CMA wrote:  Called patient to inform them of [id number] lab results. When patient returns call, triage nurse may disclose results.  >> Sep 14, 2017  4:02 PM Elliot GaultBell, Tiffany M wrote:  Relation to pt: self Call back number: [phone] (H)   Reason for call:  Patient returning call regarding lab results, please advise

## 2017-09-18 ENCOUNTER — Other Ambulatory Visit (INDEPENDENT_AMBULATORY_CARE_PROVIDER_SITE_OTHER): Payer: PPO

## 2017-09-18 DIAGNOSIS — R7989 Other specified abnormal findings of blood chemistry: Secondary | ICD-10-CM | POA: Diagnosis not present

## 2017-09-19 LAB — THYROID PEROXIDASE ANTIBODY: Thyroperoxidase Ab SerPl-aCnc: 1 IU/mL (ref <9)

## 2017-09-27 DIAGNOSIS — M5136 Other intervertebral disc degeneration, lumbar region: Secondary | ICD-10-CM | POA: Diagnosis not present

## 2017-09-27 DIAGNOSIS — M9901 Segmental and somatic dysfunction of cervical region: Secondary | ICD-10-CM | POA: Diagnosis not present

## 2017-09-27 DIAGNOSIS — M5412 Radiculopathy, cervical region: Secondary | ICD-10-CM | POA: Diagnosis not present

## 2017-09-27 DIAGNOSIS — M9903 Segmental and somatic dysfunction of lumbar region: Secondary | ICD-10-CM | POA: Diagnosis not present

## 2017-10-02 ENCOUNTER — Other Ambulatory Visit: Payer: Self-pay | Admitting: Family Medicine

## 2017-10-03 NOTE — Telephone Encounter (Signed)
Pt needs a refill metoprolol . The refill was sent to dr cook. Pt sees dr Judie Grieve

## 2017-10-11 ENCOUNTER — Encounter: Payer: Self-pay | Admitting: *Deleted

## 2017-10-11 NOTE — Progress Notes (Signed)
Cardiology Office Note  Date:  10/12/2017   ID:  Gabriela Reed, DOB December 01, 1940, MRN [id number]  PCP:  McLean-Scocuzza, Pasty Spillers, MD   Chief Complaint  Patient presents with  . OTHER    12 month f/u no complaints today. Meds reviewed verbally with pt.    HPI:  Gabriela Reed is a 77 y.o.  woman with history of  morbid obesity, Diabetes type 2 paroxysmal atrial fibrillation,  coronary artery disease with stent to the LAD in 2004, presenting for routine follow-up of her coronary artery disease and PAF  In follow-up today she reports being in atrial fibrillation when recently seen by primary care Does not feel atrial fibrillation Occasionally has a palpitation "if it goes too fast" Some worsening shortness of breath Periodically takes propranolol  Reports she is compliant with her anticoagulation, xarelto Feels that she is having some gas or bloating on the diabetes medication  Weight continues to run high Denies sleep apnea  EKG personally reviewed by myself on todays visit Shows Atrial fibrillation with ventricular rate 111 bpm nonspecific ST abnormality  Other past medical history reviewed  Prior stress test 06/25/2015 showing no ischemia   hospital January 26 with chest pain, paroxysmal atrial fibrillation The daytime was not on anticoagulation. Long discussion, she agreed to restart anticoagulation  November 27, 2014:  event monitor and was found to have atrial fibrillation .    PMH:   has a past medical history of Chicken pox, Coronary artery disease, DM (diabetes mellitus), type 2 (HCC), Glaucoma, Heart murmur, Heart palpitations, Hyperlipidemia, Hypertension, and PAF (paroxysmal atrial fibrillation) (HCC).  PSH:    Past Surgical History:  Procedure Laterality Date  . CARDIAC CATHETERIZATION     [place] x1 stent  . coronary artery stent  2004  . TONSILLECTOMY AND ADENOIDECTOMY  1949    Current Outpatient Medications  Medication Sig Dispense Refill  .  ACCU-CHEK SOFTCLIX LANCETS lancets Use as instructed with Accu Check One Touch Ultra Mini glucometer to test blood sugars twice daily.  Dx E11.9 100 each 5  . atorvastatin (LIPITOR) 20 MG tablet Take 1 tablet (20 mg total) by mouth at bedtime. 90 tablet 3  . glucose blood (ONE TOUCH ULTRA TEST) test strip Use up to 4 times daily to check blood sugar. 100 each 12  . glucose blood test strip 1 each by Other route as needed for other. Use as instructed with Accu Check One Touch Ultra Mini glucometer to test blood sugars twice daily.  Dx E11.9 100 each 5  . hydrochlorothiazide (HYDRODIURIL) 25 MG tablet Take 1 tablet (25 mg total) by mouth daily. 90 tablet 3  . metFORMIN (GLUCOPHAGE-XR) 500 MG 24 hr tablet Take 2 tablets (1,000 mg total) by mouth 2 (two) times daily. 360 tablet 1  . metoprolol succinate (TOPROL-XL) 100 MG 24 hr tablet TAKE 1 TABLET BY MOUTH ONCE DAILY. TAKE WITH OR IMMEDIATELY FOLLOWING A MEAL 90 tablet 3  . metroNIDAZOLE (METROGEL) 1 % gel Apply topically daily. To face for rosacea 60 g 2  . ONETOUCH DELICA LANCETS 33G MISC Use up to 4 times daily to check blood sugar. 100 each 11  . propranolol (INDERAL) 20 MG tablet Take 1 tablet (20 mg total) by mouth 3 (three) times daily as needed. 270 tablet 3  . sitaGLIPtin (JANUVIA) 100 MG tablet Take 1 tablet (100 mg total) by mouth daily. 90 tablet 1  . XARELTO 20 MG TABS tablet TAKE ONE TABLET BY MOUTH ONCE DAILY WITH  SUPPER  30 tablet 3  . flecainide (TAMBOCOR) 100 MG tablet Take 1 tablet (100 mg total) by mouth 2 (two) times daily. 60 tablet 6   No current facility-administered medications for this visit.      Allergies:   Niaspan [niacin er] and Lisinopril   Social History:  The patient  reports that she has quit smoking. She has never used smokeless tobacco. She reports that she drinks alcohol. She reports that she does not use drugs.   Family History:   family history includes Heart attack in her father; Heart disease (age of  onset: 64) in her father; Hyperlipidemia in her father and mother; Hypertension in her father and mother.    Review of Systems: Review of Systems  Constitutional: Negative.   Respiratory: Negative.   Cardiovascular: Positive for palpitations.       Tachycardia  Gastrointestinal: Negative.   Musculoskeletal: Negative.   Neurological: Negative.   Psychiatric/Behavioral: Negative.   All other systems reviewed and are negative.    PHYSICAL EXAM: VS:  BP 126/78 (BP Location: Left Arm, Patient Position: Sitting, Cuff Size: Large)   Pulse (!) 111   Ht  (1.626 m)   Wt 226 lb 12 oz (102.9 kg)   BMI 38.92 kg/m  , BMI Body mass index is 38.92 kg/m. Constitutional:  oriented to person, place, and time. No distress. obese HENT:  Head: Normocephalic and atraumatic.  Eyes:  no discharge. No scleral icterus.  Neck: Normal range of motion. Neck supple. No JVD present.  Cardiovascular: irregularly irregular, tachycardic,  normal heart sounds and intact distal pulses. Exam reveals no gallop and no friction rub. No edema No murmur heard. Pulmonary/Chest: Effort normal and breath sounds normal. No stridor. No respiratory distress.  no wheezes.  no rales.  no tenderness.  Abdominal: Soft.  no distension.  no tenderness.  Musculoskeletal: Normal range of motion.  no  tenderness or deformity.  Neurological:  normal muscle tone. Coordination normal. No atrophy Skin: Skin is warm and dry. No rash noted. not diaphoretic.  Psychiatric:  normal mood and affect. behavior is normal. Thought content normal.   Recent Labs: 06/12/2017: Hemoglobin 14.4; Platelets 179.0 09/14/2017: ALT 25; BUN 18; Creatinine, Ser 0.77; Potassium 3.6; Sodium 139; TSH 4.94    Lipid Panel Lab Results  Component Value Date   CHOL 106 09/14/2017   HDL 38.90 (L) 09/14/2017   LDLCALC 43 09/14/2017   TRIG 124.0 09/14/2017      Wt Readings from Last 3 Encounters:  10/12/17 226 lb 12 oz (102.9 kg)  09/14/17 231 lb 6.4  oz (105 kg)  06/12/17 237 lb 12.8 oz (107.9 kg)       ASSESSMENT AND PLAN:  Paroxysmal atrial fibrillation (HCC) She is taking diltiazem propranolol periodically,  Now with persistent atrial fibrillation Some worsening shortness of breath on exertion Recommended she increase metoprolol succinate up to 100 mg in the morning and 50 mg at night We will add flecainide 100 mg twice a day Treadmill for flecainide in 2 weeks' time If she continues to be in atrial fibrillation we will schedule cardioversion  Coronary artery disease of native artery of native heart with stable angina pectoris (HCC) Some worsening shortness of breath likely secondary to atrial fibrillation We'll try to restore normal sinus rhythm No plan for ischemic workup at this time  Essential hypertension Medication changes as above Recommended changing her diet, left stump modification, weight loss  Hyperlipidemia with target LDL less than 70 Cholesterol is at goal on  the current lipid regimen. No changes to the medications were made. stable  Type 2 diabetes mellitus with other circulatory complication, without long-term current use of insulin (HCC) Stressed importance of weight loss  Hemoglobin A1c elevated Recommended dietary changes  Morbid obesity (HCC) Recommended she continue her exercise program but watch her carbohydrate intake She is down 10 pounds over the past 6 months   Total encounter time more than 45 minutes  Greater than 50% was spent in counseling and coordination of care with the patient   Disposition:   F/U  5-6 weeks   Orders Placed This Encounter  Procedures  . Exercise Tolerance Test  . EKG 12-Lead     Signed, Dossie Arbour, M.D., Ph.D. 10/12/2017  [facility] [place], [place] [phone]

## 2017-10-12 ENCOUNTER — Encounter: Payer: Self-pay | Admitting: Cardiovascular Disease

## 2017-10-12 ENCOUNTER — Ambulatory Visit: Payer: PPO | Admitting: Cardiovascular Disease

## 2017-10-12 VITALS — BP 126/78 | HR 111 | Ht 64.0 in | Wt 226.8 lb

## 2017-10-12 DIAGNOSIS — I25118 Atherosclerotic heart disease of native coronary artery with other forms of angina pectoris: Secondary | ICD-10-CM

## 2017-10-12 DIAGNOSIS — E785 Hyperlipidemia, unspecified: Secondary | ICD-10-CM | POA: Diagnosis not present

## 2017-10-12 DIAGNOSIS — I481 Persistent atrial fibrillation: Secondary | ICD-10-CM

## 2017-10-12 DIAGNOSIS — I10 Essential (primary) hypertension: Secondary | ICD-10-CM

## 2017-10-12 DIAGNOSIS — E1159 Type 2 diabetes mellitus with other circulatory complications: Secondary | ICD-10-CM | POA: Diagnosis not present

## 2017-10-12 DIAGNOSIS — I4819 Other persistent atrial fibrillation: Secondary | ICD-10-CM

## 2017-10-12 MED ORDER — PROPRANOLOL HCL 20 MG PO TABS: 20.0000 mg | ORAL_TABLET | Freq: Three times a day (TID) | ORAL | 3 refills | Status: DC | PRN

## 2017-10-12 MED ORDER — FLECAINIDE ACETATE 100 MG PO TABS: 100.0000 mg | ORAL_TABLET | Freq: Two times a day (BID) | ORAL | 6 refills | Status: DC

## 2017-10-12 NOTE — Patient Instructions (Addendum)
We need an treadmill EKG in 2 weeks   Medication Instructions:   Please increase the metoprolol up to 100 mg in the Am and add 50 mg at night  Start flecainide 100 mg twice a day  Take over the counter potassium daily  Labwork:  No new labs needed  Testing/Procedures:  No further testing at this time   Follow-Up: It was a pleasure seeing you in the office today. Please call us if you have new issues that need to be addressed before your next appt.  [phone]  Your physician wants you to follow-up in: 5 weeks   If you need a refill on your cardiac medications before your next appointment, please call your pharmacy.  For educational health videos Log in to : [email or web address] Or : [email or web address], password : [email or web address]

## 2017-10-24 ENCOUNTER — Telehealth: Payer: Self-pay | Admitting: Cardiovascular Disease

## 2017-10-24 NOTE — Telephone Encounter (Signed)
Pt states she is having some bad reactions from her medications. States she has SOB and headaches and her eyes feel funny. She is not sure if it is the flecainide or Metoprolol. States her ears also have a ringing in them

## 2017-10-24 NOTE — Telephone Encounter (Signed)
Left voicemail message to call back  

## 2017-10-26 ENCOUNTER — Ambulatory Visit (INDEPENDENT_AMBULATORY_CARE_PROVIDER_SITE_OTHER): Payer: PPO | Admitting: *Deleted

## 2017-10-26 ENCOUNTER — Telehealth: Payer: Self-pay | Admitting: Cardiovascular Disease

## 2017-10-26 DIAGNOSIS — I4819 Other persistent atrial fibrillation: Secondary | ICD-10-CM

## 2017-10-26 DIAGNOSIS — I481 Persistent atrial fibrillation: Secondary | ICD-10-CM | POA: Diagnosis not present

## 2017-10-26 NOTE — Telephone Encounter (Signed)
Spoke with patient and reviewed plan of care. She verbalized understanding with no further questions at this time.

## 2017-10-26 NOTE — Telephone Encounter (Signed)
Patient was in today for her treadmill flecainide test and she denies receiving a voicemail message. Called her number while she was here to confirm that it went through. EKG done and she was in normal sinus rhythm. See nurse visit note for additional information.

## 2017-10-26 NOTE — Telephone Encounter (Signed)
Pt is returning the call to pam to let her know she received her message.

## 2017-10-26 NOTE — Progress Notes (Signed)
1.) Reason for visit: Treadmill ordered for flecainide but patient has not taken in 2 whole days so switched to nurse visit with EKG check.  2.) Name of MD requesting visit: Dr. Mariah Milling ordered Treadmill but since she stopped medication we did EKG  3.) H&P: Afib, diabetes, CAD  4.) ROS related to problem: Patient states that she feels much better and has not had any new episodes of afib. She was on flecainide for 10 days but started having shortness of breath, headaches, and just felt terrible. Since she stopped taking the flecainide for 2 whole days then treadmill test switched to just EKG check.   5.) Assessment and plan per MD: EKG show sinus rhythm at this time and she reports feeling better since stopping the flecainide. She reports sensitivities to medications and that she could not tolerate it. Patient instructed to continue holding medication and keep scheduled follow up with Dr. Mariah Milling 11/14/17 at 09:00AM.

## 2017-10-27 NOTE — Addendum Note (Signed)
Addended by: Bryna Colander on: 10/27/2017 11:08 AM   Modules accepted: Orders

## 2017-11-01 DIAGNOSIS — M9903 Segmental and somatic dysfunction of lumbar region: Secondary | ICD-10-CM | POA: Diagnosis not present

## 2017-11-01 DIAGNOSIS — M5412 Radiculopathy, cervical region: Secondary | ICD-10-CM | POA: Diagnosis not present

## 2017-11-01 DIAGNOSIS — M5136 Other intervertebral disc degeneration, lumbar region: Secondary | ICD-10-CM | POA: Diagnosis not present

## 2017-11-01 DIAGNOSIS — M9901 Segmental and somatic dysfunction of cervical region: Secondary | ICD-10-CM | POA: Diagnosis not present

## 2017-11-11 NOTE — Progress Notes (Signed)
Cardiology Office Note  Date:  11/14/2017   ID:  Gabriela Reed, DOB [birth year], MRN [id number]  PCP:  McLean-Scocuzza, Pasty Spillers, MD   Chief Complaint  Patient presents with  . other    5 wk f/u c/o flecainide caused sob, headaches and diarrhea. Meds reviewed verbally with pt.    HPI:  Gabriela Reed is a 77 y.o.  woman with history of  morbid obesity, Diabetes type 2 paroxysmal atrial fibrillation,  coronary artery disease with stent to the LAD in 2004, presenting for routine follow-up of her coronary artery disease and PAF  Started on flecainide had side effects, SOB, tired, dirrhea, H/A Stopped the pill after 10 days  Had to cut back on the metoprolol , made tinnitus worse  Started on januvia  Overall she feels great today with no complaints Does not monitor heart rate on a regular basis Feels better in normal sinus rhythm Tolerating metoprolol succinate 100 in the morning  EKG personally reviewed by myself on todays visit  shows normal sinus rhythm with rate 61 bpm no significant ST or T-wave changes  Other past medical history reviewed  Prior stress test 06/25/2015 showing no ischemia   hospital January 26 with chest pain, paroxysmal atrial fibrillation The daytime was not on anticoagulation. Long discussion, she agreed to restart anticoagulation  November 27, 2014:  event monitor and was found to have atrial fibrillation .    PMH:   has a past medical history of Chicken pox, Coronary artery disease, DM (diabetes mellitus), type 2 (HCC), Glaucoma, Heart murmur, Heart palpitations, Hyperlipidemia, Hypertension, and PAF (paroxysmal atrial fibrillation) (HCC).  PSH:    Past Surgical History:  Procedure Laterality Date  . CARDIAC CATHETERIZATION     [place] x1 stent  . coronary artery stent  2004  . TONSILLECTOMY AND ADENOIDECTOMY  1949    Current Outpatient Medications  Medication Sig Dispense Refill  . ACCU-CHEK SOFTCLIX LANCETS lancets Use as instructed  with Accu Check One Touch Ultra Mini glucometer to test blood sugars twice daily.  Dx E11.9 100 each 5  . atorvastatin (LIPITOR) 20 MG tablet Take 1 tablet (20 mg total) by mouth at bedtime. 90 tablet 3  . glucose blood (ONE TOUCH ULTRA TEST) test strip Use up to 4 times daily to check blood sugar. 100 each 12  . glucose blood test strip 1 each by Other route as needed for other. Use as instructed with Accu Check One Touch Ultra Mini glucometer to test blood sugars twice daily.  Dx E11.9 100 each 5  . hydrochlorothiazide (HYDRODIURIL) 25 MG tablet Take 1 tablet (25 mg total) by mouth daily. 90 tablet 3  . metFORMIN (GLUCOPHAGE-XR) 500 MG 24 hr tablet Take 2 tablets (1,000 mg total) by mouth 2 (two) times daily. 360 tablet 1  . metoprolol succinate (TOPROL-XL) 100 MG 24 hr tablet TAKE 1 TABLET BY MOUTH ONCE DAILY. TAKE WITH OR IMMEDIATELY FOLLOWING A MEAL 90 tablet 3  . metroNIDAZOLE (METROGEL) 1 % gel Apply topically daily. To face for rosacea 60 g 2  . ONETOUCH DELICA LANCETS 33G MISC Use up to 4 times daily to check blood sugar. 100 each 11  . propranolol (INDERAL) 20 MG tablet Take 1 tablet (20 mg total) by mouth 3 (three) times daily as needed. 270 tablet 3  . sitaGLIPtin (JANUVIA) 100 MG tablet Take 1 tablet (100 mg total) by mouth daily. 90 tablet 1  . XARELTO 20 MG TABS tablet TAKE ONE TABLET BY MOUTH ONCE DAILY  WITH  SUPPER 30 tablet 3   No current facility-administered medications for this visit.      Allergies:   Niaspan [niacin er] and Lisinopril   Social History:  The patient  reports that she has quit smoking. She has never used smokeless tobacco. She reports that she drinks alcohol. She reports that she does not use drugs.   Family History:   family history includes Heart attack in her father; Heart disease (age of onset: 99) in her father; Hyperlipidemia in her father and mother; Hypertension in her father and mother.    Review of Systems: Review of Systems  Constitutional:  Negative.   Respiratory: Negative.   Cardiovascular: Negative.        Tachycardia  Gastrointestinal: Negative.   Musculoskeletal: Negative.   Neurological: Negative.   Psychiatric/Behavioral: Negative.   All other systems reviewed and are negative.    PHYSICAL EXAM: VS:  BP 134/80 (BP Location: Left Arm, Patient Position: Sitting, Cuff Size: Large)   Pulse 61   Ht 5\' 4"  (1.626 m)   Wt 229 lb 8 oz (104.1 kg)   BMI 39.39 kg/m  , BMI Body mass index is 39.39 kg/m.  No significant change in exam Constitutional:  oriented to person, place, and time. No distress. obese HENT:  Head: Normocephalic and atraumatic.  Eyes:  no discharge. No scleral icterus.  Neck: Normal range of motion. Neck supple. No JVD present.  Cardiovascular: regular rate and rhythm, normal heart sounds and intact distal pulses. Exam reveals no gallop and no friction rub. No edema No murmur heard. Pulmonary/Chest: Effort normal and breath sounds normal. No stridor. No respiratory distress.  no wheezes.  no rales.  no tenderness.  Abdominal: Soft.  no distension.  no tenderness.  Musculoskeletal: Normal range of motion.  no  tenderness or deformity.  Neurological:  normal muscle tone. Coordination normal. No atrophy Skin: Skin is warm and dry. No rash noted. not diaphoretic.  Psychiatric:  normal mood and affect. behavior is normal. Thought content normal.   Recent Labs: 06/12/2017: Hemoglobin 14.4; Platelets 179.0 09/14/2017: ALT 25; BUN 18; Creatinine, Ser 0.77; Potassium 3.6; Sodium 139; TSH 4.94    Lipid Panel Lab Results  Component Value Date   CHOL 106 09/14/2017   HDL 38.90 (L) 09/14/2017   LDLCALC 43 09/14/2017   TRIG 124.0 09/14/2017      Wt Readings from Last 3 Encounters:  11/14/17 229 lb 8 oz (104.1 kg)  10/12/17 226 lb 12 oz (102.9 kg)  09/14/17 231 lb 6.4 oz (105 kg)       ASSESSMENT AND PLAN:  Paroxysmal atrial fibrillation (HCC) Continue metoprolol succinate 100 mg daily Had side  effects on flecainide and extra metoprolol Recommended she take propranolol with flecainide on as-needed basis for atrial fibrillation episodes Stay on anticoagulation If she has persistent atrial fibrillation we would need to try alternate antiarrhythmic such as propafenone  Coronary artery disease of native artery of native heart with stable angina pectoris (HCC) Currently with no symptoms of angina. No further workup at this time. Continue current medication regimen.  Essential hypertension Blood pressure is well controlled on today's visit. No changes made to the medications. Stable  Hyperlipidemia with target LDL less than 70 Cholesterol is at goal on the current lipid regimen. No changes to the medications were made. stable  Type 2 diabetes mellitus with other circulatory complication, without long-term current use of insulin (HCC) Recently started on new diabetes medications Stressed importance of weight loss  Overall she feels well Recommended dietary changes  Morbid obesity (HCC) Recommended she continue her exercise program but watch her carbohydrate intake She is down 10 pounds over the past 6 months   Total encounter time more than 25 minutes  Greater than 50% was spent in counseling and coordination of care with the patient   Disposition:   F/U  6 months   Orders Placed This Encounter  Procedures  . EKG 12-Lead     Signed, Dossie Arbourim Joyanna Kleman, M.D., Ph.D. 11/14/2017  [facility], [place] [phone]

## 2017-11-13 ENCOUNTER — Other Ambulatory Visit: Payer: Self-pay | Admitting: Family Medicine

## 2017-11-13 NOTE — Telephone Encounter (Signed)
Dr. Shirlee LatchMclean has not prescribed this before Dr. Adriana Simasook did but now patient see's Dr. Shirlee LatchMclean , this was last filled on 11-14-16 she saw Dr. Shirlee LatchMclean on 09-14-17 has follow up appt. 12-11-17. Please advise

## 2017-11-14 ENCOUNTER — Ambulatory Visit: Payer: PPO | Admitting: Cardiovascular Disease

## 2017-11-14 ENCOUNTER — Encounter: Payer: Self-pay | Admitting: Cardiovascular Disease

## 2017-11-14 VITALS — BP 134/80 | HR 61 | Ht 64.0 in | Wt 229.5 lb

## 2017-11-14 DIAGNOSIS — I480 Paroxysmal atrial fibrillation: Secondary | ICD-10-CM

## 2017-11-14 DIAGNOSIS — I10 Essential (primary) hypertension: Secondary | ICD-10-CM | POA: Diagnosis not present

## 2017-11-14 DIAGNOSIS — E1159 Type 2 diabetes mellitus with other circulatory complications: Secondary | ICD-10-CM

## 2017-11-14 DIAGNOSIS — I25118 Atherosclerotic heart disease of native coronary artery with other forms of angina pectoris: Secondary | ICD-10-CM

## 2017-11-14 DIAGNOSIS — E785 Hyperlipidemia, unspecified: Secondary | ICD-10-CM

## 2017-11-14 MED ORDER — FLECAINIDE ACETATE 100 MG PO TABS: 100.0000 mg | ORAL_TABLET | Freq: Two times a day (BID) | ORAL | 6 refills | Status: DC | PRN

## 2017-11-14 NOTE — Patient Instructions (Addendum)
Look for a pulse oximeter to measure  Normal heart rate /pulse rate 55 to 80 BPM If it runs 95 to 120 BPM, you are in atrial fib  Take propranolol and flecainide   Medication Instructions:   No medication changes made  Labwork:  No new labs needed  Testing/Procedures:  No further testing at this time   Follow-Up: It was a pleasure seeing you in the office today. Please call us if you have new issues that need to be addressed before your next appt.  [phone]  Your physician wants you to follow-up in: 6 months.  You will receive a reminder letter in the mail two months in advance. If you don't receive a letter, please call our office to schedule the follow-up appointment.  If you need a refill on your cardiac medications before your next appointment, please call your pharmacy.  For educational health videos Log in to : [email or web address] Or : [email or web address], password : [email or web address]

## 2017-11-16 ENCOUNTER — Telehealth: Payer: Self-pay | Admitting: Internal Medicine

## 2017-11-16 NOTE — Telephone Encounter (Signed)
Patient saw Dr. Shirlee LatchMclean in April of this year and has a scheduled appt in July. This was last filled by Dr. Adriana Simasook June 2018, Would she like to refill?

## 2017-11-16 NOTE — Telephone Encounter (Signed)
Copied from CRM [id number]. Topic: Quick Communication - Rx Refill/Question >> Nov 16, 2017  2:48 PM Mickel BaasMcGee, Casey Fye B, NT wrote: Medication: atorvastatin (LIPITOR) 20 MG tablet  Has the patient contacted their pharmacy? Yes.   (Agent: If no, request that the patient contact the pharmacy for the refill.) (Agent: If yes, when and what did the pharmacy advise?)  Preferred Pharmacy (with phone number or street name[id number] - [place], [place] - [address]  Agent: Please be advised that RX refills may take up to 3 business days. We ask that you follow-up with your pharmacy.

## 2017-11-17 MED ORDER — ATORVASTATIN CALCIUM 20 MG PO TABS: 20.0000 mg | ORAL_TABLET | Freq: Every day | ORAL | 3 refills | Status: DC

## 2017-11-17 NOTE — Telephone Encounter (Signed)
Medication has been refilled.

## 2017-11-17 NOTE — Telephone Encounter (Signed)
Atorvastatin refill Last Refilled by another provider Last OV: 09/14/17 PCP: Dr. Judie GrieveMcLean-Scocuzza Pharmacy: [facility]  [address]

## 2017-12-06 DIAGNOSIS — M5136 Other intervertebral disc degeneration, lumbar region: Secondary | ICD-10-CM | POA: Diagnosis not present

## 2017-12-06 DIAGNOSIS — M9903 Segmental and somatic dysfunction of lumbar region: Secondary | ICD-10-CM | POA: Diagnosis not present

## 2017-12-06 DIAGNOSIS — M5412 Radiculopathy, cervical region: Secondary | ICD-10-CM | POA: Diagnosis not present

## 2017-12-06 DIAGNOSIS — M9901 Segmental and somatic dysfunction of cervical region: Secondary | ICD-10-CM | POA: Diagnosis not present

## 2017-12-11 ENCOUNTER — Ambulatory Visit (INDEPENDENT_AMBULATORY_CARE_PROVIDER_SITE_OTHER): Payer: PPO | Admitting: Internal Medicine

## 2017-12-11 ENCOUNTER — Ambulatory Visit: Payer: Self-pay | Admitting: Family Medicine

## 2017-12-11 ENCOUNTER — Encounter: Payer: Self-pay | Admitting: Internal Medicine

## 2017-12-11 VITALS — BP 134/82 | HR 68 | Temp 98.3°F | Resp 16 | Ht 61.0 in | Wt 232.2 lb

## 2017-12-11 DIAGNOSIS — R946 Abnormal results of thyroid function studies: Secondary | ICD-10-CM | POA: Diagnosis not present

## 2017-12-11 DIAGNOSIS — R7989 Other specified abnormal findings of blood chemistry: Secondary | ICD-10-CM

## 2017-12-11 DIAGNOSIS — H6121 Impacted cerumen, right ear: Secondary | ICD-10-CM

## 2017-12-11 DIAGNOSIS — I10 Essential (primary) hypertension: Secondary | ICD-10-CM

## 2017-12-11 DIAGNOSIS — L719 Rosacea, unspecified: Secondary | ICD-10-CM

## 2017-12-11 DIAGNOSIS — E119 Type 2 diabetes mellitus without complications: Secondary | ICD-10-CM | POA: Diagnosis not present

## 2017-12-11 LAB — COMPREHENSIVE METABOLIC PANEL
ALBUMIN: 4.5 g/dL (ref 3.5–5.2)
ALT: 26 U/L (ref 0–35)
AST: 24 U/L (ref 0–37)
Alkaline Phosphatase: 50 U/L (ref 39–117)
BUN: 19 mg/dL (ref 6–23)
CALCIUM: 9.8 mg/dL (ref 8.4–10.5)
CHLORIDE: 103 meq/L (ref 96–112)
CO2: 26 mEq/L (ref 19–32)
Creatinine, Ser: 0.81 mg/dL (ref 0.40–1.20)
GFR: 73.27 mL/min (ref >60.00)
Glucose, Bld: 138 mg/dL — ABNORMAL HIGH (ref 70–99)
POTASSIUM: 3.8 meq/L (ref 3.5–5.1)
Sodium: 138 mEq/L (ref 135–145)
Total Bilirubin: 0.7 mg/dL (ref 0.2–1.2)
Total Protein: 7.5 g/dL (ref 6.0–8.3)

## 2017-12-11 LAB — TSH: TSH: 4.2 u[IU]/mL (ref 0.35–4.50)

## 2017-12-11 LAB — HEMOGLOBIN A1C: Hgb A1c MFr Bld: 7.3 % — ABNORMAL HIGH (ref 4.6–6.5)

## 2017-12-11 MED ORDER — GLUCOSE BLOOD VI STRP: 1.0000 | ORAL_STRIP | Freq: Every day | 3 refills | Status: DC

## 2017-12-11 NOTE — Progress Notes (Signed)
Chief Complaint  Patient presents with  . Follow-up  . Diabetes  . Hyperlipidemia   F/u  1. HTN BP elevated intially repeat 134/82  2. DM2 A1C 7.9 08/2017 does not want to take [place] any longer due to joint pain h/a and URI sx's needs test strips refilled   3. Elevated TSh will recheck today  4. C/o right ear unable to hear and 1 year ago had wax in it would like removed  5. Rosacea improved has white bump to chin   Review of Systems  Constitutional: Negative for weight loss.  HENT: Positive for hearing loss and tinnitus.   Eyes: Negative for blurred vision.  Cardiovascular: Negative for chest pain.  Gastrointestinal: Negative for abdominal pain.  Musculoskeletal: Positive for joint pain.  Skin: Negative for rash.  Neurological: Negative for headaches.  Psychiatric/Behavioral: Negative for depression.   Past Medical History:  Diagnosis Date  . Chicken pox   . Coronary artery disease    a. s/p DES to mid-LAD in 2004, cath in 2006 showing patient stent with moderate CAD in the distal LAD  . DM (diabetes mellitus), type 2 ([place])   . Glaucoma   . Heart murmur   . Heart palpitations   . Hyperlipidemia   . Hypertension   . PAF (paroxysmal atrial fibrillation) ([place])    a. diagnosed in 10/2014 b. quit taking Eliquis in 01/2015 due to myalgias. Refuses to be on anticoagulation   Past Surgical History:  Procedure Laterality Date  . CARDIAC CATHETERIZATION     [place] x1 stent  . coronary artery stent  2004  . TONSILLECTOMY AND ADENOIDECTOMY  1949   Family History  Problem Relation Age of Onset  . Hyperlipidemia Mother   . Hypertension Mother   . Hyperlipidemia Father   . Hypertension Father   . Heart disease Father 20  . Heart attack Father    Social History   Socioeconomic History  . Marital status: Divorced    Spouse name: Not on file  . Number of children: Not on file  . Years of education: Not on file  . Highest education level: Not on file  Occupational  History  . Not on file  Social Needs  . Financial resource strain: Not on file  . Food insecurity:    Worry: Not on file    Inability: Not on file  . Transportation needs:    Medical: Not on file    Non-medical: Not on file  Tobacco Use  . Smoking status: Former Research scientist (life sciences)  . Smokeless tobacco: Never Used  . Tobacco comment: quit 25 years ago  Substance and Sexual Activity  . Alcohol use: Yes    Alcohol/week: 0.0 oz    Comment: very rarely  . Drug use: No  . Sexual activity: Not on file  Lifestyle  . Physical activity:    Days per week: Not on file    Minutes per session: Not on file  . Stress: Not on file  Relationships  . Social connections:    Talks on phone: Not on file    Gets together: Not on file    Attends religious service: Not on file    Active member of club or organization: Not on file    Attends meetings of clubs or organizations: Not on file    Relationship status: Not on file  . Intimate partner violence:    Fear of current or ex partner: Not on file    Emotionally abused: Not on file  Physically abused: Not on file    Forced sexual activity: Not on file  Other Topics Concern  . Not on file  Social History Narrative   Lives in [place]. From [place] and moved here for her daughter, granddaughter. in home.      Work - retired.   Diet - regular   Exercise - [employer]   Current Meds  Medication Sig  . ACCU-CHEK SOFTCLIX LANCETS lancets Use as instructed with Accu Check One Touch Ultra Mini glucometer to test blood sugars twice daily.  Dx E11.9  . atorvastatin (LIPITOR) 20 MG tablet Take 1 tablet (20 mg total) by mouth at bedtime.  . flecainide (TAMBOCOR) 100 MG tablet Take 1 tablet (100 mg total) by mouth 2 (two) times daily as needed.  Marland Kitchen glucose blood (ONE TOUCH ULTRA TEST) test strip 1 each by Other route as needed for other. Use as instructed  . hydrochlorothiazide (HYDRODIURIL) 25 MG tablet Take 1 tablet (25 mg total) by mouth daily.  Marland Kitchen latanoprost  (XALATAN) 0.005 % ophthalmic solution INSTILL 1 DROP INTO EACH EYE AT BEDTIME  . metFORMIN (GLUCOPHAGE-XR) 500 MG 24 hr tablet Take 2 tablets (1,000 mg total) by mouth 2 (two) times daily.  . metoprolol succinate (TOPROL-XL) 100 MG 24 hr tablet TAKE 1 TABLET BY MOUTH ONCE DAILY. TAKE WITH OR IMMEDIATELY FOLLOWING A MEAL  . metroNIDAZOLE (METROGEL) 1 % gel Apply topically daily. To face for rosacea  . ONETOUCH DELICA LANCETS 10F MISC Use up to 4 times daily to check blood sugar.  . propranolol (INDERAL) 20 MG tablet Take 1 tablet (20 mg total) by mouth 3 (three) times daily as needed.  . sitaGLIPtin (JANUVIA) 100 MG tablet Take 1 tablet (100 mg total) by mouth daily.  Alveda Reasons 20 MG TABS tablet TAKE ONE TABLET BY MOUTH ONCE DAILY WITH  SUPPER   Allergies  Allergen Reactions  . Niaspan [Niacin Er] Rash  . Lisinopril Cough   Recent Results (from the past 2160 hour(s))  Comprehensive metabolic panel     Status: Abnormal   Collection Time: 09/14/17  9:08 AM  Result Value Ref Range   Sodium 139 135 - 145 mEq/L   Potassium 3.6 3.5 - 5.1 mEq/L   Chloride 102 96 - 112 mEq/L   CO2 26 19 - 32 mEq/L   Glucose, Bld 153 (H) 70 - 99 mg/dL   BUN 18 6 - 23 mg/dL   Creatinine, Ser 0.77 0.40 - 1.20 mg/dL   Total Bilirubin 0.7 0.2 - 1.2 mg/dL   Alkaline Phosphatase 52 39 - 117 U/L   AST 24 0 - 37 U/L   ALT 25 0 - 35 U/L   Total Protein 7.0 6.0 - 8.3 g/dL   Albumin 4.1 3.5 - 5.2 g/dL   Calcium 9.5 8.4 - 10.5 mg/dL   GFR 77.73 >60.00 mL/min  T4, free     Status: None   Collection Time: 09/14/17  9:08 AM  Result Value Ref Range   Free T4 0.77 0.60 - 1.60 ng/dL    Comment: Specimens from patients who are undergoing biotin therapy and /or ingesting biotin supplements may contain high levels of biotin.  The higher biotin concentration in these specimens interferes with this Free T4 assay.  Specimens that contain high levels  of biotin may cause false high results for this Free T4 assay.  Please  interpret results in light of the total clinical presentation of the patient.    TSH     Status:  Abnormal   Collection Time: 09/14/17  9:08 AM  Result Value Ref Range   TSH 4.94 (H) 0.35 - 4.50 uIU/mL  Hemoglobin A1c     Status: Abnormal   Collection Time: 09/14/17  9:08 AM  Result Value Ref Range   Hgb A1c MFr Bld 7.9 (H) 4.6 - 6.5 %    Comment: Glycemic Control Guidelines for People with Diabetes:Non Diabetic:  <6%Goal of Therapy: <7%Additional Action Suggested:  >8%   Lipid panel     Status: Abnormal   Collection Time: 09/14/17  9:08 AM  Result Value Ref Range   Cholesterol 106 0 - 200 mg/dL    Comment: ATP III Classification       Desirable:  < 200 mg/dL               Borderline High:  200 - 239 mg/dL          High:  > = 240 mg/dL   Triglycerides 124.0 0.0 - 149.0 mg/dL    Comment: Normal:  <150 mg/dLBorderline High:  150 - 199 mg/dL   HDL 38.90 (L) >39.00 mg/dL   VLDL 24.8 0.0 - 40.0 mg/dL   LDL Cholesterol 43 0 - 99 mg/dL   Total CHOL/HDL Ratio 3     Comment:                Men          Women1/2 Average Risk     3.4          3.3Average Risk          5.0          4.42X Average Risk          9.6          7.13X Average Risk          15.0          11.0                       NonHDL 67.48     Comment: NOTE:  Non-HDL goal should be 30 mg/dL higher than patient's LDL goal (i.e. LDL goal of < 70 mg/dL, would have non-HDL goal of < 100 mg/dL)  Thyroid peroxidase antibody     Status: None   Collection Time: 09/18/17  1:52 PM  Result Value Ref Range   Thyroperoxidase Ab SerPl-aCnc 1 <9 IU/mL   Objective  Body mass index is 43.88 kg/m. Wt Readings from Last 3 Encounters:  12/11/17 232 lb 4 oz (105.3 kg)  11/14/17 229 lb 8 oz (104.1 kg)  10/12/17 226 lb 12 oz (102.9 kg)   Temp Readings from Last 3 Encounters:  12/11/17 98.3 F (36.8 C) (Oral)  09/14/17 98.2 F (36.8 C) (Oral)  06/12/17 (!) 97.4 F (36.3 C) (Oral)   BP Readings from Last 3 Encounters:  12/11/17 134/82  11/14/17  134/80  10/12/17 126/78   Pulse Readings from Last 3 Encounters:  12/11/17 68  11/14/17 61  10/12/17 (!) 111    Physical Exam  Constitutional: She is oriented to person, place, and time. Vital signs are normal. She appears well-developed and well-nourished. She is cooperative.  HENT:  Head: Normocephalic and atraumatic.  Mouth/Throat: Oropharynx is clear and moist and mucous membranes are normal.    Eyes: Pupils are equal, round, and reactive to light. Conjunctivae are normal.  Cardiovascular: Normal rate, regular rhythm and normal heart sounds.  No murmur heard. Pulmonary/Chest: Effort normal  and breath sounds normal.  Neurological: She is alert and oriented to person, place, and time. Gait normal.  Skin: Skin is warm, dry and intact.  Psychiatric: She has a normal mood and affect. Her speech is normal and behavior is normal. Judgment and thought content normal. Cognition and memory are normal.  Nursing note and vitals reviewed.   Assessment   1. HTN  2. DM 2 A1C 7.9  3. Elevated TSH  4. Right ear wax removed cerumen impaction and removed left ear wax  5. Rosacea with milia to chin  6. HM Plan   1. Cont meds  Pt declines ARB allergic to acei with proteinuria  2. D/c januvia 100 unable to tolerate, metfromin 1000 mg bid XR Check A1C, CMET Does not want ARB allergic to ACEI 3. Check TSH consider thyroid US in future  4. Removed wax with currette 5. Cont meds metrogel  6.  Had flu shot  prevnar had and pna 23 Zoster had, declines shingrix, Tdap   DEXA 04/07/16 neg, colonoscopy had, out of age age window pap, mammo declines  rec calcium 600 mg bid, vit D 3 1000 IU qd pt declines supplements  Declines MMR and UA and vit D check     Provider: Dr. Olivia Mackie McLean-Scocuzza-Internal Medicine

## 2017-12-11 NOTE — Patient Instructions (Addendum)
F/u in 4-6 months sooner if needed   Debrox/Carbamide Peroxide ear solution What is this medicine? CARBAMIDE PEROXIDE (CAR bah mide per OX ide) is used to soften and help remove ear wax. This medicine may be used for other purposes; ask your health care provider or pharmacist if you have questions. COMMON BRAND NAME(S): Auro Ear, Auro Earache Relief, Debrox, Ear Drops, Ear Wax Removal, Ear Wax Remover, Earwax Treatment, Murine, Thera-Ear What should I tell my health care provider before I take this medicine? They need to know if you have any of these conditions: -dizziness -ear discharge -ear pain, irritation or rash -infection -perforated eardrum (hole in eardrum) -an unusual or allergic reaction to carbamide peroxide, glycerin, hydrogen peroxide, other medicines, foods, dyes, or preservatives -pregnant or trying to get pregnant -breast-feeding How should I use this medicine? This medicine is only for use in the outer ear canal. Follow the directions carefully. Wash hands before and after use. The solution may be warmed by holding the bottle in the hand for 1 to 2 minutes. Lie with the affected ear facing upward. Place the proper number of drops into the ear canal. After the drops are instilled, remain lying with the affected ear upward for 5 minutes to help the drops stay in the ear canal. A cotton ball may be gently inserted at the ear opening for no longer than 5 to 10 minutes to ensure retention. Repeat, if necessary, for the opposite ear. Do not touch the tip of the dropper to the ear, fingertips, or other surface. Do not rinse the dropper after use. Keep container tightly closed. Talk to your pediatrician regarding the use of this medicine in children. While this drug may be used in children as young as 12 years for selected conditions, precautions do apply. Overdosage: If you think you have taken too much of this medicine contact a poison control center or emergency room at once. NOTE:  This medicine is only for you. Do not share this medicine with others. What if I miss a dose? If you miss a dose, use it as soon as you can. If it is almost time for your next dose, use only that dose. Do not use double or extra doses. What may interact with this medicine? Interactions are not expected. Do not use any other ear products without asking your doctor or health care professional. This list may not describe all possible interactions. Give your health care provider a list of all the medicines, herbs, non-prescription drugs, or dietary supplements you use. Also tell them if you smoke, drink alcohol, or use illegal drugs. Some items may interact with your medicine. What should I watch for while using this medicine? This medicine is not for long-term use. Do not use for more than 4 days without checking with your health care professional. Contact your doctor or health care professional if your condition does not start to get better within a few days or if you notice burning, redness, itching or swelling. What side effects may I notice from receiving this medicine? Side effects that you should report to your doctor or health care professional as soon as possible: -allergic reactions like skin rash, itching or hives, swelling of the face, lips, or tongue -burning, itching, and redness -worsening ear pain -rash Side effects that usually do not require medical attention (report to your doctor or health care professional if they continue or are bothersome): -abnormal sensation while putting the drops in the ear -temporary reduction in hearing (but  not complete loss of hearing) This list may not describe all possible side effects. Call your doctor for medical advice about side effects. You may report side effects to FDA at 1-800-FDA-1088. Where should I keep my medicine? Keep out of the reach of children. Store at room temperature between 15 and 30 degrees C (59 and 86 degrees F) in a tight,  light-resistant container. Keep bottle away from excessive heat and direct sunlight. Throw away any unused medicine after the expiration date. NOTE: This sheet is a summary. It may not cover all possible information. If you have questions about this medicine, talk to your doctor, pharmacist, or health care provider.  2018 Elsevier/Gold Standard (2007-08-28 14:00:02)  Earwax Buildup, Adult The ears produce a substance called earwax that helps keep bacteria out of the ear and protects the skin in the ear canal. Occasionally, earwax can build up in the ear and cause discomfort or hearing loss. What increases the risk? This condition is more likely to develop in people who:  Are female.  Are elderly.  Naturally produce more earwax.  Clean their ears often with cotton swabs.  Use earplugs often.  Use in-ear headphones often.  Wear hearing aids.  Have narrow ear canals.  Have earwax that is overly thick or sticky.  Have eczema.  Are dehydrated.  Have excess hair in the ear canal.  What are the signs or symptoms? Symptoms of this condition include:  Reduced or muffled hearing.  A feeling of fullness in the ear or feeling that the ear is plugged.  Fluid coming from the ear.  Ear pain.  Ear itch.  Ringing in the ear.  Coughing.  An obvious piece of earwax that can be seen inside the ear canal.  How is this diagnosed? This condition may be diagnosed based on:  Your symptoms.  Your medical history.  An ear exam. During the exam, your health care provider will look into your ear with an instrument called an otoscope.  You may have tests, including a hearing test. How is this treated? This condition may be treated by:  Using ear drops to soften the earwax.  Having the earwax removed by a health care provider. The health care provider may: ? Flush the ear with water. ? Use an instrument that has a loop on the end (curette). ? Use a suction device.  Surgery to  remove the wax buildup. This may be done in severe cases.  Follow these instructions at home:  Take over-the-counter and prescription medicines only as told by your health care provider.  Do not put any objects, including cotton swabs, into your ear. You can clean the opening of your ear canal with a washcloth or facial tissue.  Follow instructions from your health care provider about cleaning your ears. Do not over-clean your ears.  Drink enough fluid to keep your urine clear or pale yellow. This will help to thin the earwax.  Keep all follow-up visits as told by your health care provider. If earwax builds up in your ears often or if you use hearing aids, consider seeing your health care provider for routine, preventive ear cleanings. Ask your health care provider how often you should schedule your cleanings.  If you have hearing aids, clean them according to instructions from the manufacturer and your health care provider. Contact a health care provider if:  You have ear pain.  You develop a fever.  You have blood, pus, or other fluid coming from your ear.  You have hearing loss.  You have ringing in your ears that does not go away.  Your symptoms do not improve with treatment.  You feel like the room is spinning (vertigo). Summary  Earwax can build up in the ear and cause discomfort or hearing loss.  The most common symptoms of this condition include reduced or muffled hearing and a feeling of fullness in the ear or feeling that the ear is plugged.  This condition may be diagnosed based on your symptoms, your medical history, and an ear exam.  This condition may be treated by using ear drops to soften the earwax or by having the earwax removed by a health care provider.  Do not put any objects, including cotton swabs, into your ear. You can clean the opening of your ear canal with a washcloth or facial tissue. This information is not intended to replace advice given to you  by your health care provider. Make sure you discuss any questions you have with your health care provider. Document Released: 06/23/2004 Document Revised: 07/27/2016 Document Reviewed: 07/27/2016 Elsevier Interactive Patient Education  [employer].

## 2017-12-12 NOTE — Progress Notes (Signed)
[facility]

## 2017-12-26 ENCOUNTER — Telehealth: Payer: Self-pay | Admitting: Cardiovascular Disease

## 2017-12-26 ENCOUNTER — Other Ambulatory Visit: Payer: Self-pay

## 2017-12-26 NOTE — Telephone Encounter (Signed)
*  STAT* If patient is at the pharmacy, call can be transferred to refill team.   1. Which medications need to be refilled? (please list name of each medication and dose if known) Metoprolol  2. Which pharmacy/location (including street and city if local pharmacy) is medication to be sent to? [facility] [address]  3. Do they need a 30 day or 90 day supply? 90   

## 2017-12-26 NOTE — Telephone Encounter (Signed)
To Dr. Mariah MillingGollan to clarify.

## 2017-12-26 NOTE — Telephone Encounter (Signed)
lmovm to let pt know to contact her pharmacy.

## 2017-12-26 NOTE — Telephone Encounter (Signed)
Patient returning call to marina about metoprolol refill.  See previous msg unable to link.    Patient says Dr. Mariah Millinggollan increased dose and did not send updated rx.

## 2017-12-26 NOTE — Telephone Encounter (Signed)
Gabriela Reed, Patient is returning your call

## 2017-12-26 NOTE — Telephone Encounter (Signed)
Primary Care just filled metoprolol succinate 100 mg once daily 10/04/17- to the [facility] pharmacy for #90 w/ 3 refills. She should just need to call the pharmacy to refill.   Thanks!

## 2017-12-26 NOTE — Telephone Encounter (Signed)
Please advise if ok to refill Metoprolol never filled by our office.

## 2017-12-26 NOTE — Telephone Encounter (Signed)
Pt mentioned that Dr. Mariah MillingGollan told her verbally at the last OV due to elevated HR if she had Metoprolol Succinate 100 mg tablet to add extra 1/2 tablet to help with HR. Pt mentioned she never got a new Rx for the medication when Dr. Mariah MillingGollan verbally told her to up her dose for 15 x days to 1 1/2 tablet daily.  Pt is now taking her medication Metoprolol Succ. 100 mg tablet daily.  She contact the pharmacy and they have refills on file but will not refill her medication bc it is too early to refill and they never received the Rx for the Change he made so they can't confirm why she doesn't have enough pills this month. I don't see anywhere in the pt's chart where he noted for her to take Metoprolol 150 mg daily for 15 x days.  Please advise pt.

## 2017-12-27 NOTE — Telephone Encounter (Signed)
Request forwarded to Dr. Mariah MillingGollan & triage for verification.

## 2017-12-27 NOTE — Telephone Encounter (Signed)
I thought she had side effects on extra metoprolol But she is welcome to continue metoprolol succinate 100 mg in morning on a regular basis With extra 50 mg in the evening as needed We can send in more on next prescription

## 2017-12-28 NOTE — Telephone Encounter (Signed)
I left a message for the patient to call back to clarify her pharmacy and if she needs #30 day/ #90 supply sent in for metoprolol ER. Will change RX to read:  Metoprolol succinate 100 mg- take 1 tablet (100 mg) by mouth once daily, may take an extra 1/2 tablet (50 mg) at night as needed for palpitations/elevated HR's

## 2017-12-29 MED ORDER — METOPROLOL SUCCINATE ER 100 MG PO TB24: 100.0000 mg | ORAL_TABLET | ORAL | 3 refills | Status: DC

## 2017-12-29 NOTE — Telephone Encounter (Signed)
Spoke with patient and she reports that she ran out of pills and they would not refill because it was too soon with current instructions of once daily. Reviewed medication with her and sent in refill to her pharmacy. She was appreciative for the call with no further questions at this time.

## 2017-12-29 NOTE — Telephone Encounter (Signed)
Patient returning our call

## 2018-01-08 DIAGNOSIS — M9903 Segmental and somatic dysfunction of lumbar region: Secondary | ICD-10-CM | POA: Diagnosis not present

## 2018-01-08 DIAGNOSIS — M9901 Segmental and somatic dysfunction of cervical region: Secondary | ICD-10-CM | POA: Diagnosis not present

## 2018-01-08 DIAGNOSIS — M5136 Other intervertebral disc degeneration, lumbar region: Secondary | ICD-10-CM | POA: Diagnosis not present

## 2018-01-08 DIAGNOSIS — M5412 Radiculopathy, cervical region: Secondary | ICD-10-CM | POA: Diagnosis not present

## 2018-01-29 ENCOUNTER — Encounter: Payer: Self-pay | Admitting: Gynecology

## 2018-01-29 ENCOUNTER — Other Ambulatory Visit: Payer: Self-pay

## 2018-01-29 ENCOUNTER — Ambulatory Visit
Admission: EM | Admit: 2018-01-29 | Discharge: 2018-01-29 | Disposition: A | Discharge status: Home / Self Care | Payer: PPO | Attending: Family Medicine | Admitting: Family Medicine

## 2018-01-29 DIAGNOSIS — R059 Cough, unspecified: Secondary | ICD-10-CM

## 2018-01-29 DIAGNOSIS — R0981 Nasal congestion: Secondary | ICD-10-CM

## 2018-01-29 DIAGNOSIS — R05 Cough: Secondary | ICD-10-CM

## 2018-01-29 DIAGNOSIS — J029 Acute pharyngitis, unspecified: Secondary | ICD-10-CM | POA: Diagnosis not present

## 2018-01-29 MED ORDER — DOXYCYCLINE HYCLATE 100 MG PO CAPS: 100.0000 mg | ORAL_CAPSULE | Freq: Two times a day (BID) | ORAL | 0 refills | Status: DC

## 2018-01-29 MED ORDER — BENZONATATE 100 MG PO CAPS: 100.0000 mg | ORAL_CAPSULE | Freq: Three times a day (TID) | ORAL | 0 refills | Status: DC | PRN

## 2018-01-29 NOTE — ED Provider Notes (Signed)
[facility] ____________________________________________  Time seen: Approximately 8:34 AM  I have reviewed the triage vital signs and the nursing notes.   HISTORY  Chief Complaint Cough  HPI Gabriela Reed is a 77 y.o. female presenting for evaluation of 2 weeks of cough.  Reports symptoms initially started off with nasal congestion, scratchy throat and then led into cough.  States scratchy throat is resolved, nasal congestion mostly resolved but continues with a hacking cough.  Sometimes feels some chest congestion but mostly hacking cough.  Denies fevers recently, possible fevers at the beginning of sickness.  Has continued to eat and drink well.  States unresolved with over-the-counter ricola lozenges.  Denies other aggravating or alleviating factors.  Has continue to remain active.  Denies known sick contacts.  Reports otherwise feels well.  Denies chest pain, shortness of breath, abdominal pain, extremity swelling or rash. Denies recent sickness. Denies recent antibiotic use.   McLean-Scocuzza, Pasty Spillers, MD: PCP   Past Medical History:  Diagnosis Date  . Chicken pox   . Coronary artery disease    a. s/p DES to mid-LAD in 2004, cath in 2006 showing patient stent with moderate CAD in the distal LAD  . DM (diabetes mellitus), type 2 (HCC)   . Glaucoma   . Heart murmur   . Heart palpitations   . Hyperlipidemia   . Hypertension   . PAF (paroxysmal atrial fibrillation) (HCC)    a. diagnosed in 10/2014 b. quit taking Eliquis in 01/2015 due to myalgias. Refuses to be on anticoagulation    Patient Active Problem List   Diagnosis Date Noted  . Rosacea 09/14/2017  . Fatty liver 09/14/2017  . Abnormal thyroid function test 09/14/2017  . Cough 06/12/2017  . Cerumen impaction 06/12/2017  . Paroxysmal atrial fibrillation (HCC) 11/27/2014  . Morbid obesity (HCC) 03/05/2014  . Coronary artery disease 03/13/2012  . Diabetes mellitus (HCC) 12/12/2011  . Glaucoma  12/12/2011  . Hypertension 12/12/2011  . Hyperlipidemia with target LDL less than 70 12/12/2011    Past Surgical History:  Procedure Laterality Date  . CARDIAC CATHETERIZATION     [place] x1 stent  . coronary artery stent  2004  . TONSILLECTOMY AND ADENOIDECTOMY  1949     No current facility-administered medications for this encounter.   Current Outpatient Medications:  .  ACCU-CHEK SOFTCLIX LANCETS lancets, Use as instructed with Accu Check One Touch Ultra Mini glucometer to test blood sugars twice daily.  Dx E11.9, Disp: 100 each, Rfl: 5 .  atorvastatin (LIPITOR) 20 MG tablet, Take 1 tablet (20 mg total) by mouth at bedtime., Disp: 90 tablet, Rfl: 3 .  flecainide (TAMBOCOR) 100 MG tablet, Take 1 tablet (100 mg total) by mouth 2 (two) times daily as needed., Disp: 60 tablet, Rfl: 6 .  glucose blood (ONE TOUCH ULTRA TEST) test strip, 1 each by Other route daily. Use as instructed  E11.9, Disp: 100 each, Rfl: 3 .  hydrochlorothiazide (HYDRODIURIL) 25 MG tablet, Take 1 tablet (25 mg total) by mouth daily., Disp: 90 tablet, Rfl: 3 .  latanoprost (XALATAN) 0.005 % ophthalmic solution, INSTILL 1 DROP INTO EACH EYE AT BEDTIME, Disp: , Rfl: 5 .  metFORMIN (GLUCOPHAGE-XR) 500 MG 24 hr tablet, Take 2 tablets (1,000 mg total) by mouth 2 (two) times daily., Disp: 360 tablet, Rfl: 1 .  metoprolol succinate (TOPROL-XL) 100 MG 24 hr tablet, Take 1 tablet (100 mg total) by mouth as directed. 1 tablet Daily in the AM and 1/2 tablet in the  PM as needed fast heart rate/palpitations, Disp: 135 tablet, Rfl: 3 .  metroNIDAZOLE (METROGEL) 1 % gel, Apply topically daily. To face for rosacea, Disp: 60 g, Rfl: 2 .  ONETOUCH DELICA LANCETS 33G MISC, Use up to 4 times daily to check blood sugar., Disp: 100 each, Rfl: 11 .  XARELTO 20 MG TABS tablet, TAKE ONE TABLET BY MOUTH ONCE DAILY WITH  SUPPER, Disp: 30 tablet, Rfl: 3 .  benzonatate (TESSALON PERLES) 100 MG capsule, Take 1 capsule (100 mg total) by mouth 3  (three) times daily as needed for cough., Disp: 15 capsule, Rfl: 0 .  doxycycline (VIBRAMYCIN) 100 MG capsule, Take 1 capsule (100 mg total) by mouth 2 (two) times daily., Disp: 20 capsule, Rfl: 0  Allergies Niaspan [niacin er]; Januvia [sitagliptin]; and Lisinopril  Family History  Problem Relation Age of Onset  . Hyperlipidemia Mother   . Hypertension Mother   . Hyperlipidemia Father   . Hypertension Father   . Heart disease Father 74  . Heart attack Father     Social History Social History   Tobacco Use  . Smoking status: Former Games developer  . Smokeless tobacco: Never Used  . Tobacco comment: quit 25 years ago  Substance Use Topics  . Alcohol use: Yes    Alcohol/week: 0.0 standard drinks    Comment: very rarely  . Drug use: No    Review of Systems Constitutional: No fever ENT: No sore throat. Cardiovascular: Denies chest pain. Respiratory: Denies shortness of breath. Gastrointestinal: No abdominal pain.   Musculoskeletal: Negative for back pain. Skin: Negative for rash.  ____________________________________________   PHYSICAL EXAM:  VITAL SIGNS: ED Triage Vitals [01/29/18 0818]  Enc Vitals Group     BP (!) 153/79     Pulse Rate 78     Resp 16     Temp 98.1 F (36.7 C)     Temp Source Oral     SpO2 98 %     Weight 230 lb (104.3 kg)     Height 5\' 4"  (1.626 m)     Head Circumference      Peak Flow      Pain Score      Pain Loc      Pain Edu?      Excl. in GC?     Constitutional: Alert and oriented. Well appearing and in no acute distress. Eyes: Conjunctivae are normal. Head: Atraumatic. No sinus tenderness to palpation. No swelling. No erythema.  Ears: no erythema, normal TMs bilaterally.   Nose:Mild nasal congestion   Mouth/Throat: Mucous membranes are moist. No pharyngeal erythema. No tonsillar swelling or exudate.  Neck: No stridor.  No cervical spine tenderness to palpation. Hematological/Lymphatic/Immunilogical: No cervical  lymphadenopathy. Cardiovascular: Normal rate, regular rhythm. Grossly normal heart sounds.  Good peripheral circulation. Respiratory: Normal respiratory effort.  No retractions. No wheezes.  Mild scattered rhonchi.  Good air movement.  Dry intermittent hacking cough noted in room.  Speaks in complete sentences. Musculoskeletal: Ambulatory with steady gait. Neurologic:  Normal speech and language. No gait instability. Skin:  Skin appears warm, dry and intact. No rash noted. Psychiatric: Mood and affect are normal. Speech and behavior are normal.  ___________________________________________   LABS (all labs ordered are listed, but only abnormal results are displayed)  Labs Reviewed - No data to display   PROCEDURES Procedures   INITIAL IMPRESSION / ASSESSMENT AND PLAN / ED COURSE  Pertinent labs & imaging results that were available during my care of the patient  were reviewed by me and considered in my medical decision making (see chart for details).  Well-appearing patient.  No acute distress.  Cough for the last 2 weeks.  Mild scattered rhonchi, otherwise lungs clear throughout.  Will treat with oral doxycycline and PRN Tessalon Perles.  Encourage rest, fluids, supportive care.Discussed indication, risks and benefits of medications with patient.  Discussed follow up with Primary care physician this week. Discussed follow up and return parameters including no resolution or any worsening concerns. Patient verbalized understanding and agreed to plan.   ____________________________________________   FINAL CLINICAL IMPRESSION(S) / ED DIAGNOSES  Final diagnoses:  Cough     ED Discharge Orders         Ordered    doxycycline (VIBRAMYCIN) 100 MG capsule  2 times daily     01/29/18 0831    benzonatate (TESSALON PERLES) 100 MG capsule  3 times daily PRN     01/29/18 0831           Note: This dictation was prepared with Dragon dictation along with smaller phrase technology. Any  transcriptional errors that result from this process are unintentional.         Renford Dills, NP 01/29/18 [id number]

## 2018-01-29 NOTE — ED Triage Notes (Signed)
Patient c/o cough x couple days. 

## 2018-01-29 NOTE — Discharge Instructions (Addendum)
Take medication as prescribed. Rest. Drink plenty of fluids.  ° °Follow up with your primary care physician this week as needed. Return to Urgent care for new or worsening concerns.  ° °

## 2018-02-23 DIAGNOSIS — M5412 Radiculopathy, cervical region: Secondary | ICD-10-CM | POA: Diagnosis not present

## 2018-02-23 DIAGNOSIS — M9903 Segmental and somatic dysfunction of lumbar region: Secondary | ICD-10-CM | POA: Diagnosis not present

## 2018-02-23 DIAGNOSIS — M9901 Segmental and somatic dysfunction of cervical region: Secondary | ICD-10-CM | POA: Diagnosis not present

## 2018-02-23 DIAGNOSIS — M5136 Other intervertebral disc degeneration, lumbar region: Secondary | ICD-10-CM | POA: Diagnosis not present

## 2018-02-27 ENCOUNTER — Other Ambulatory Visit: Payer: Self-pay

## 2018-02-27 ENCOUNTER — Ambulatory Visit
Admission: EM | Admit: 2018-02-27 | Discharge: 2018-02-27 | Disposition: A | Discharge status: Home / Self Care | Payer: PPO | Attending: Family Medicine | Admitting: Family Medicine

## 2018-02-27 ENCOUNTER — Encounter: Payer: Self-pay | Admitting: Emergency Medicine

## 2018-02-27 DIAGNOSIS — K529 Noninfective gastroenteritis and colitis, unspecified: Secondary | ICD-10-CM | POA: Diagnosis not present

## 2018-02-27 MED ORDER — ONDANSETRON 4 MG PO TBDP: 4.0000 mg | ORAL_TABLET | Freq: Three times a day (TID) | ORAL | 0 refills | Status: DC | PRN

## 2018-02-27 MED ORDER — AMOXICILLIN 500 MG PO TABS: 500.0000 mg | ORAL_TABLET | Freq: Three times a day (TID) | ORAL | 0 refills | Status: AC

## 2018-02-27 NOTE — Discharge Instructions (Addendum)
Antibiotic as prescribed.  Take care  Dr. Jovian Lembcke  

## 2018-02-27 NOTE — ED Provider Notes (Signed)
[facility]    CSN: [id number] Arrival date & time: 02/27/18  1746  History   Chief Complaint Chief Complaint  Patient presents with  . Diarrhea  . Generalized Body Aches   HPI   77 year old female presents with the above complaints.  Patient reports a 3-day history of body aches, headaches, diarrhea, nausea.  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  Patient states that she looked up her symptoms and compared to those with Listeria and she feels like this is consistent.  She is had one episode of diarrhea today which is improved from prior.  She does feel nauseated still.  Still having body aches.  Symptoms are slightly improved.  No fever.  No other reported symptoms.  No other complaints.  PMH, Surgical Hx, Family Hx, Social History reviewed and updated as below.  Past Medical History:  Diagnosis Date  . Chicken pox   . Coronary artery disease    a. s/p DES to mid-LAD in 2004, cath in 2006 showing patient stent with moderate CAD in the distal LAD  . DM (diabetes mellitus), type 2 (HCC)   . Glaucoma   . Heart murmur   . Heart palpitations   . Hyperlipidemia   . Hypertension   . PAF (paroxysmal atrial fibrillation) (HCC)    a. diagnosed in 10/2014 b. quit taking Eliquis in 01/2015 due to myalgias. Refuses to be on anticoagulation    Patient Active Problem List   Diagnosis Date Noted  . Rosacea 09/14/2017  . Fatty liver 09/14/2017  . Abnormal thyroid function test 09/14/2017  . Cough 06/12/2017  . Cerumen impaction 06/12/2017  . Paroxysmal atrial fibrillation (HCC) 11/27/2014  . Morbid obesity (HCC) 03/05/2014  . Coronary artery disease 03/13/2012  . Diabetes mellitus (HCC) 12/12/2011  . Glaucoma 12/12/2011  . Hypertension 12/12/2011  . Hyperlipidemia with target LDL less than 70 12/12/2011    Past Surgical History:  Procedure  Laterality Date  . CARDIAC CATHETERIZATION     [place] x1 stent  . coronary artery stent  2004  . TONSILLECTOMY AND ADENOIDECTOMY  1949    OB History   None      Home Medications    Prior to Admission medications   Medication Sig Start Date End Date Taking? Authorizing Provider  atorvastatin (LIPITOR) 20 MG tablet Take 1 tablet (20 mg total) by mouth at bedtime. 11/17/17  Yes McLean-Scocuzza, Pasty Spillers, MD  flecainide (TAMBOCOR) 100 MG tablet Take 1 tablet (100 mg total) by mouth 2 (two) times daily as needed. 11/14/17  Yes Gollan, Tollie Pizza, MD  hydrochlorothiazide (HYDRODIURIL) 25 MG tablet Take 1 tablet (25 mg total) by mouth daily. 09/06/17  Yes McLean-Scocuzza, Pasty Spillers, MD  latanoprost (XALATAN) 0.005 % ophthalmic solution INSTILL 1 DROP INTO EACH EYE AT BEDTIME 11/17/17  Yes [provider]  metFORMIN (GLUCOPHAGE-XR) 500 MG 24 hr tablet Take 2 tablets (1,000 mg total) by mouth 2 (two) times daily. 09/06/17  Yes McLean-Scocuzza, Pasty Spillers, MD  metoprolol succinate (TOPROL-XL) 100 MG 24 hr tablet Take 1 tablet (100 mg total) by mouth as directed. 1 tablet Daily in the AM and 1/2 tablet in the PM as needed fast heart rate/palpitations 12/29/17  Yes Gollan, Tollie Pizza, MD  XARELTO 20 MG TABS tablet TAKE ONE TABLET BY MOUTH ONCE DAILY WITH  SUPPER 06/30/17  Yes Gollan, Tollie Pizza, MD  ACCU-CHEK SOFTCLIX LANCETS  lancets Use as instructed with Accu Check One Touch Ultra Mini glucometer to test blood sugars twice daily.  Dx E11.9 03/07/16   Allegra Grana, FNP  amoxicillin (AMOXIL) 500 MG tablet Take 1 tablet (500 mg total) by mouth 3 (three) times daily for 7 days. 02/27/18 03/06/18  Everlene Other G, DO  glucose blood (ONE TOUCH ULTRA TEST) test strip 1 each by Other route daily. Use as instructed  E11.9 12/11/17   McLean-Scocuzza, Pasty Spillers, MD  ondansetron (ZOFRAN-ODT) 4 MG disintegrating tablet Take 1 tablet (4 mg total) by mouth every 8 (eight) hours as needed for nausea or vomiting. 02/27/18    Amelianna Meller, Verdis Frederickson, DO  ONETOUCH DELICA LANCETS 33G MISC Use up to 4 times daily to check blood sugar. 09/05/16   Tommie Sams, DO    Family History Family History  Problem Relation Age of Onset  . Hyperlipidemia Mother   . Hypertension Mother   . Hyperlipidemia Father   . Hypertension Father   . Heart disease Father 27  . Heart attack Father     Social History Social History   Tobacco Use  . Smoking status: Former Games developer  . Smokeless tobacco: Never Used  . Tobacco comment: quit 25 years ago  Substance Use Topics  . Alcohol use: Yes    Alcohol/week: 0.0 standard drinks    Comment: very rarely  . Drug use: No     Allergies   Niaspan [niacin er]; Januvia [sitagliptin]; and Lisinopril   Review of Systems Review of Systems  Gastrointestinal: Positive for diarrhea and nausea.  Musculoskeletal:       Body aches.  Neurological: Positive for headaches.   Physical Exam Triage Vital Signs ED Triage Vitals  Enc Vitals Group     BP 02/27/18 1805 117/80     Pulse Rate 02/27/18 1805 78     Resp 02/27/18 1805 16     Temp 02/27/18 1805 98.2 F (36.8 C)     Temp Source 02/27/18 1805 Oral     SpO2 02/27/18 1805 98 %     Weight 02/27/18 1803 230 lb (104.3 kg)     Height 02/27/18 1803 5\' 4"  (1.626 m)     Head Circumference --      Peak Flow --      Pain Score 02/27/18 1802 4     Pain Loc --      Pain Edu? --      Excl. in GC? --    Updated Vital Signs BP 117/80 (BP Location: Left Arm)   Pulse 78   Temp 98.2 F (36.8 C) (Oral)   Resp 16   Ht 5\' 4"  (1.626 m)   Wt 104.3 kg   SpO2 98%   BMI 39.48 kg/m   Visual Acuity Right Eye Distance:   Left Eye Distance:   Bilateral Distance:    Right Eye Near:   Left Eye Near:    Bilateral Near:     Physical Exam  Constitutional: She is oriented to person, place, and time. She appears well-developed. No distress.  HENT:  Head: Normocephalic and atraumatic.  Cardiovascular:  Irregularly irregular.  Pulmonary/Chest: Effort  normal and breath sounds normal. She has no wheezes. She has no rales.  Abdominal: Soft. She exhibits no distension. There is no tenderness.  Neurological: She is alert and oriented to person, place, and time.  Psychiatric: She has a normal mood and affect. Her behavior is normal.  Nursing note and vitals reviewed.  UC  Treatments / Results  Labs (all labs ordered are listed, but only abnormal results are displayed) Labs Reviewed - No data to display  EKG None  Radiology No results found.  Procedures Procedures (including critical care time)  Medications Ordered in UC Medications - No data to display  Initial Impression / Assessment and Plan / UC Course  I have reviewed the triage vital signs and the nursing notes.  Pertinent labs & imaging results that were available during my care of the patient were reviewed by me and considered in my medical decision making (see chart for details).    77 year old female presents with suspected gastroenteritis from contaminated chicken salad.  We discussed symptom medic treatment versus treatment with antibiotics.  Given age and persistent symptoms, patient and I have elected to proceed with antibiotic treatment to cover for Listeria.  Treating with amoxicillin.  Zofran as needed for nausea.  Final Clinical Impressions(s) / UC Diagnoses   Final diagnoses:  Gastroenteritis     Discharge Instructions     Antibiotic as prescribed.  Take care  Dr. Adriana Simas    ED Prescriptions    Medication Sig Dispense Auth. Provider   amoxicillin (AMOXIL) 500 MG tablet Take 1 tablet (500 mg total) by mouth 3 (three) times daily for 7 days. 21 tablet Ayen Viviano G, DO   ondansetron (ZOFRAN-ODT) 4 MG disintegrating tablet Take 1 tablet (4 mg total) by mouth every 8 (eight) hours as needed for nausea or vomiting. 20 tablet Tommie Sams, DO     Controlled Substance Prescriptions [place] Controlled Substance Registry consulted? Not Applicable   Tommie Sams, DO 02/27/18 1919

## 2018-02-27 NOTE — ED Triage Notes (Signed)
Pt c/o muscle aches, forearm pain, headaches, and diarrhea. This started  3 days ago. She got a letter today stating that chicken salad that she had eaten was recalled from [employer] for Listeria.

## 2018-03-16 ENCOUNTER — Ambulatory Visit (INDEPENDENT_AMBULATORY_CARE_PROVIDER_SITE_OTHER): Payer: PPO

## 2018-03-16 DIAGNOSIS — Z23 Encounter for immunization: Secondary | ICD-10-CM | POA: Diagnosis not present

## 2018-03-27 DIAGNOSIS — M9901 Segmental and somatic dysfunction of cervical region: Secondary | ICD-10-CM | POA: Diagnosis not present

## 2018-03-27 DIAGNOSIS — M5136 Other intervertebral disc degeneration, lumbar region: Secondary | ICD-10-CM | POA: Diagnosis not present

## 2018-03-27 DIAGNOSIS — M9903 Segmental and somatic dysfunction of lumbar region: Secondary | ICD-10-CM | POA: Diagnosis not present

## 2018-03-27 DIAGNOSIS — M5412 Radiculopathy, cervical region: Secondary | ICD-10-CM | POA: Diagnosis not present

## 2018-04-19 ENCOUNTER — Other Ambulatory Visit: Payer: Self-pay | Admitting: Cardiovascular Disease

## 2018-04-19 NOTE — Telephone Encounter (Signed)
Refill Request.  

## 2018-04-20 ENCOUNTER — Other Ambulatory Visit: Payer: Self-pay

## 2018-04-20 MED ORDER — RIVAROXABAN 20 MG PO TABS: 20.0000 mg | ORAL_TABLET | Freq: Every day | ORAL | 5 refills | Status: DC

## 2018-04-20 NOTE — Telephone Encounter (Signed)
*  STAT* If patient is at the pharmacy, call can be transferred to refill team.   1. Which medications need to be refilled? (please list name of each medication and dose if known) Xarelto  2. Which pharmacy/location (including street and city if local pharmacy) is medication to be sent to? [facility] [address]  3. Do they need a 30 day or 90 day supply? 30

## 2018-04-20 NOTE — Telephone Encounter (Signed)
Please review for refill, Thanks !  

## 2018-05-14 ENCOUNTER — Ambulatory Visit: Payer: Self-pay | Admitting: Cardiovascular Disease

## 2018-05-17 DIAGNOSIS — M9901 Segmental and somatic dysfunction of cervical region: Secondary | ICD-10-CM | POA: Diagnosis not present

## 2018-05-17 DIAGNOSIS — M9903 Segmental and somatic dysfunction of lumbar region: Secondary | ICD-10-CM | POA: Diagnosis not present

## 2018-05-17 DIAGNOSIS — M5412 Radiculopathy, cervical region: Secondary | ICD-10-CM | POA: Diagnosis not present

## 2018-05-17 DIAGNOSIS — M5136 Other intervertebral disc degeneration, lumbar region: Secondary | ICD-10-CM | POA: Diagnosis not present

## 2018-05-18 ENCOUNTER — Ambulatory Visit (INDEPENDENT_AMBULATORY_CARE_PROVIDER_SITE_OTHER): Payer: PPO | Admitting: Nurse Practitioner

## 2018-05-18 ENCOUNTER — Encounter: Payer: Self-pay | Admitting: Nurse Practitioner

## 2018-05-18 VITALS — BP 150/80 | HR 96 | Ht 64.0 in | Wt 232.0 lb

## 2018-05-18 DIAGNOSIS — E119 Type 2 diabetes mellitus without complications: Secondary | ICD-10-CM

## 2018-05-18 DIAGNOSIS — I480 Paroxysmal atrial fibrillation: Secondary | ICD-10-CM | POA: Diagnosis not present

## 2018-05-18 DIAGNOSIS — E785 Hyperlipidemia, unspecified: Secondary | ICD-10-CM

## 2018-05-18 DIAGNOSIS — I2510 Atherosclerotic heart disease of native coronary artery without angina pectoris: Secondary | ICD-10-CM | POA: Diagnosis not present

## 2018-05-18 DIAGNOSIS — I10 Essential (primary) hypertension: Secondary | ICD-10-CM

## 2018-05-18 NOTE — Patient Instructions (Signed)
Medication Instructions:  Your physician recommends that you continue on your current medications as directed. Please refer to the Current Medication list given to you today.  If you need a refill on your cardiac medications before your next appointment, please call your pharmacy.   Lab work: None today   If you have labs (blood work) drawn today and your tests are completely normal, you will receive your results only by: Marland Kitchen. MyChart Message (if you have MyChart) OR . A paper copy in the mail If you have any lab test that is abnormal or we need to change your treatment, we will call you to review the results.  Testing/Procedures: None ordered   Follow-Up: [facility], you and your health needs are our priority.  As part of our continuing mission to provide you with exceptional heart care, we have created designated Provider Care Teams.  These Care Teams include your primary Cardiologist (physician) and Advanced Practice Providers (APPs -  Physician Assistants and Nurse Practitioners) who all work together to provide you with the care you need, when you need it. You will need a follow up appointment in 6 weeks.  Please see Julien Nordmannimothy Gollan, MD.

## 2018-05-18 NOTE — Progress Notes (Signed)
Office Visit    Patient Name: Gabriela Reed Date of Encounter: 05/18/2018  Primary Care Provider:  McLean-Scocuzza, Pasty Spillersracy N, MD Primary Cardiologist:  Julien Nordmannimothy Gollan, MD  Chief Complaint    77 year old female with a history of coronary artery disease status post LAD stenting in 2004, hypertension, hyperlipidemia, type 2 diabetes mellitus, and paroxysmal atrial fibrillation on Xarelto, who presents for follow-up related to A. fib.  Past Medical History    Past Medical History:  Diagnosis Date  . Chicken pox   . Coronary artery disease    a. 2004 s/p DES to mid-LAD; b. 10/2005 Cath: LM nl, LAD patent stent, 7938m, D1 nl, LCX nl, OM1/2 nl, RCA nl, EF 60%; c. 05/2015 MV: EF 83%, no ischemia. Low risk.  . Diastolic dysfunction    a. 09/2014 Echo: Nl EF. Gr1 DD. Very mild AS. Mild MR. Nl RV fxn. PASP 33mmHg.  . DM (diabetes mellitus), type 2 (HCC)   . Glaucoma   . Heart murmur   . Hyperlipidemia   . Hypertension   . PAF (paroxysmal atrial fibrillation) (HCC)    a. 10/2014 Holter: PAF b. quit taking Eliquis in 01/2015 due to myalgias; c. CHA2DS2VASc = 6-->Xarelto.   Past Surgical History:  Procedure Laterality Date  . CARDIAC CATHETERIZATION     [place] x1 stent  . coronary artery stent  2004  . TONSILLECTOMY AND ADENOIDECTOMY  1949    Allergies  Allergies  Allergen Reactions  . Niaspan [Niacin Er] Rash  . Januvia [Sitagliptin]     Joint pain, h/a, URI  . Lisinopril Cough    History of Present Illness    77 year old female with the above past medical history including coronary artery disease status post drug-eluting stent placement to the LAD in 2004, hypertension, hyperlipidemia, diabetes, diastolic dysfunction, and paroxysmal atrial fibrillation.  Her last catheterization was in June 2007, showing patent LAD stent and otherwise nonobstructive disease.  She had a low risk Myoview in 2017.  She was diagnosed with atrial fibrillation in June 2016 by Holter monitoring.   She was initially on Eliquis but did not tolerate this and has subsequently been treated with Xarelto.  She was also previously treated with daily flecainide but did not tolerate this and as a result, uses it as needed.  She says that over the past 6 months, she has probably taken 3 doses of flecainide, typically for which she identifies as irregular palpitations.  She infrequently experiences tachypalpitations.  Though prior episodes of A. fib resulted in tachycardia and fatigue, she has not had any significant fatigue with more recent episodes of PAF.  She denies chest pain, dyspnea, PND, orthopnea, dizziness, syncope, edema, or early satiety.  In the setting of recent viral illnesses and gastroenteritis, her exercise is fallen off some but she is now walking again and overall feeling well.  She is in atrial fibrillation at a rate of 96 today and she did not recognize this.  Home Medications    Prior to Admission medications   Medication Sig Start Date End Date Taking? Authorizing Provider  ACCU-CHEK SOFTCLIX LANCETS lancets Use as instructed with Accu Check One Touch Ultra Mini glucometer to test blood sugars twice daily.  Dx E11.9 03/07/16   Allegra GranaArnett, Margaret G, FNP  atorvastatin (LIPITOR) 20 MG tablet Take 1 tablet (20 mg total) by mouth at bedtime. 11/17/17   McLean-Scocuzza, Pasty Spillersracy N, MD  flecainide (TAMBOCOR) 100 MG tablet Take 1 tablet (100 mg total) by mouth 2 (two) times daily  as needed. 11/14/17   Antonieta Iba, MD  glucose blood (ONE TOUCH ULTRA TEST) test strip 1 each by Other route daily. Use as instructed  E11.9 12/11/17   McLean-Scocuzza, Pasty Spillers, MD  hydrochlorothiazide (HYDRODIURIL) 25 MG tablet Take 1 tablet (25 mg total) by mouth daily. 09/06/17   McLean-Scocuzza, Pasty Spillers, MD  latanoprost (XALATAN) 0.005 % ophthalmic solution INSTILL 1 DROP INTO EACH EYE AT BEDTIME 11/17/17   [provider]  metFORMIN (GLUCOPHAGE-XR) 500 MG 24 hr tablet Take 2 tablets (1,000 mg total) by mouth  2 (two) times daily. 09/06/17   McLean-Scocuzza, Pasty Spillers, MD  metoprolol succinate (TOPROL-XL) 100 MG 24 hr tablet Take 1 tablet (100 mg total) by mouth as directed. 1 tablet Daily in the AM and 1/2 tablet in the PM as needed fast heart rate/palpitations 12/29/17   Antonieta Iba, MD  ondansetron (ZOFRAN-ODT) 4 MG disintegrating tablet Take 1 tablet (4 mg total) by mouth every 8 (eight) hours as needed for nausea or vomiting. 02/27/18   Cook, Verdis Frederickson, DO  ONETOUCH DELICA LANCETS 33G MISC Use up to 4 times daily to check blood sugar. 09/05/16   Tommie Sams, DO  rivaroxaban (XARELTO) 20 MG TABS tablet Take 1 tablet (20 mg total) by mouth daily with supper. 04/20/18   Antonieta Iba, MD    Review of Systems    Occasional irregular palpitations without other associated symptoms.  She denies chest pain, PND, orthopnea, dizziness, syncope, edema, or early satiety.  All other systems reviewed and are otherwise negative except as noted above.  Physical Exam    VS:  BP (!) 150/80 (BP Location: Left Arm, Patient Position: Sitting, Cuff Size: Normal)   Pulse 96   Ht 5\' 4"  (1.626 m)   Wt 232 lb (105.2 kg)   BMI 39.82 kg/m  , BMI Body mass index is 39.82 kg/m. GEN: Well nourished, well developed, in no acute distress. HEENT: normal. Neck: Supple, no JVD, carotid bruits, or masses. Cardiac: Irregularly irregular, no murmurs, rubs, or gallops. No clubbing, cyanosis, edema.  Radials/DP/PT 2+ and equal bilaterally.  Respiratory:  Respirations regular and unlabored, clear to auscultation bilaterally. GI: Soft, nontender, nondistended, BS + x 4. MS: no deformity or atrophy. Skin: warm and dry, no rash. Neuro:  Strength and sensation are intact. Psych: Normal affect.  Accessory Clinical Findings    ECG personally reviewed by me today -atrial fibrillation, 96- no acute changes.  Assessment & Plan    1.  Paroxysmal atrial fibrillation: Patient with a history of PAF dating back to 2016.  She is on  beta-blocker and Xarelto therapy and uses flecainide on a as needed basis.  Over the past 6 months, she has taken flecainide 3 times for what she identified as symptomatic A. fib.  She is in atrial fibrillation today at a rate of 96 and did not notice this.  She says when she got out of her car, she thought maybe her heart rate elevated some but then says that it passed when she was able to sit down in the waiting room.  She was surprised to find that she was in A. fib on examination today.  I suspect she has much more atrial fibrillation at home than she recognizes.  As she previously had significant fatigue with more prolonged episodes of A. fib, we did discuss possibly placing a ZIO monitor to evaluate her burden of A. fib in an effort to understand whether or not she was  headed toward a more permanent atrial fibrillation, which could result in increase in symptoms.  She says that she would rather continue to check her heart rate and rhythm via pulse oximetry and she will keep a log of how many days a week she thinks she is in A. fib.  Given that she didn't realize she was in afib today, I'm not sure that this will be very accurate.  I also recommended that we increase Toprol to 1-1/2 tablets (150 mg) daily however, she said she previously had ringing in her ears at that dose and would prefer to not change any medications today.  Finally, given her history of coronary artery disease, flecainide is not ideal.  If she ultimately requires daily antiarrhythmic therapy, she would likely do well with Tikosyn.  I will defer any decision regarding flecainide to Dr. Mariah MillingGollan.  2.  Essential hypertension: Blood pressure elevated today.  She says it typically runs about 135 systolic at home.  She is on beta-blocker and diuretic therapy.  As above, we discussed potentially increasing beta-blocker however she wished to avoid this.  Given history of diabetes, an ARB would be appropriate (prev coughed on acei).  She does not  wish to start any new medications today.  I recommended she continue to follow her blood pressure at home and to keep a log.  If systolics are consistently greater than 130, we would likely look to add an ARB.  3.  Hyperlipidemia: LDL was 43 in April.  She remains on atorvastatin therapy.  4.  Type 2 diabetes mellitus: A1c 7.3 in July.  Followed by primary care and on metformin and statin.  5.  Disposition: Follow-up with Dr. Mariah MillingGollan in 6 to 8 weeks to reevaluate burden of atrial fibrillation and blood pressure.  Gabriela Duckinghristopher Jerrilyn Messinger, NP 05/18/2018, 9:16 AM

## 2018-06-01 DIAGNOSIS — H401423 Capsular glaucoma with pseudoexfoliation of lens, left eye, severe stage: Secondary | ICD-10-CM | POA: Diagnosis not present

## 2018-06-01 LAB — HM DIABETES EYE EXAM

## 2018-06-06 ENCOUNTER — Encounter: Payer: Self-pay | Admitting: Internal Medicine

## 2018-06-13 ENCOUNTER — Ambulatory Visit (INDEPENDENT_AMBULATORY_CARE_PROVIDER_SITE_OTHER): Payer: PPO | Admitting: Internal Medicine

## 2018-06-13 ENCOUNTER — Encounter: Payer: Self-pay | Admitting: Internal Medicine

## 2018-06-13 VITALS — BP 140/82 | HR 60 | Temp 98.3°F | Ht 64.0 in | Wt 227.8 lb

## 2018-06-13 DIAGNOSIS — E559 Vitamin D deficiency, unspecified: Secondary | ICD-10-CM

## 2018-06-13 DIAGNOSIS — J3489 Other specified disorders of nose and nasal sinuses: Secondary | ICD-10-CM | POA: Diagnosis not present

## 2018-06-13 DIAGNOSIS — I10 Essential (primary) hypertension: Secondary | ICD-10-CM | POA: Diagnosis not present

## 2018-06-13 DIAGNOSIS — E1159 Type 2 diabetes mellitus with other circulatory complications: Secondary | ICD-10-CM

## 2018-06-13 DIAGNOSIS — R7989 Other specified abnormal findings of blood chemistry: Secondary | ICD-10-CM | POA: Diagnosis not present

## 2018-06-13 DIAGNOSIS — E119 Type 2 diabetes mellitus without complications: Secondary | ICD-10-CM

## 2018-06-13 LAB — LIPID PANEL
CHOL/HDL RATIO: 3
Cholesterol: 122 mg/dL (ref 0–200)
HDL: 39.6 mg/dL (ref >39.00)
LDL Cholesterol: 61 mg/dL (ref 0–99)
NonHDL: 82.57
Triglycerides: 108 mg/dL (ref 0.0–149.0)
VLDL: 21.6 mg/dL (ref 0.0–40.0)

## 2018-06-13 LAB — HEMOGLOBIN A1C: HEMOGLOBIN A1C: 8.6 % — AB (ref 4.6–6.5)

## 2018-06-13 LAB — COMPREHENSIVE METABOLIC PANEL
ALT: 23 U/L (ref 0–35)
AST: 23 U/L (ref 0–37)
Albumin: 4.5 g/dL (ref 3.5–5.2)
Alkaline Phosphatase: 64 U/L (ref 39–117)
BUN: 17 mg/dL (ref 6–23)
CALCIUM: 10 mg/dL (ref 8.4–10.5)
CHLORIDE: 100 meq/L (ref 96–112)
CO2: 28 meq/L (ref 19–32)
Creatinine, Ser: 0.86 mg/dL (ref 0.40–1.20)
GFR: 68.29 mL/min (ref >60.00)
Glucose, Bld: 169 mg/dL — ABNORMAL HIGH (ref 70–99)
Potassium: 3.9 mEq/L (ref 3.5–5.1)
Sodium: 137 mEq/L (ref 135–145)
Total Bilirubin: 0.9 mg/dL (ref 0.2–1.2)
Total Protein: 7.7 g/dL (ref 6.0–8.3)

## 2018-06-13 LAB — CBC WITH DIFFERENTIAL/PLATELET
BASOS PCT: 0.5 % (ref 0.0–3.0)
Basophils Absolute: 0 10*3/uL (ref 0.0–0.1)
Eosinophils Absolute: 0.1 10*3/uL (ref 0.0–0.7)
Eosinophils Relative: 1.6 % (ref 0.0–5.0)
HEMATOCRIT: 43 % (ref 36.0–46.0)
Hemoglobin: 14 g/dL (ref 12.0–15.0)
LYMPHS PCT: 34.7 % (ref 12.0–46.0)
Lymphs Abs: 1.8 10*3/uL (ref 0.7–4.0)
MCHC: 32.7 g/dL (ref 30.0–36.0)
MCV: 85.2 fl (ref 78.0–100.0)
MONOS PCT: 9.7 % (ref 3.0–12.0)
Monocytes Absolute: 0.5 10*3/uL (ref 0.1–1.0)
NEUTROS ABS: 2.7 10*3/uL (ref 1.4–7.7)
Neutrophils Relative %: 53.5 % (ref 43.0–77.0)
PLATELETS: 193 10*3/uL (ref 150.0–400.0)
RBC: 5.04 Mil/uL (ref 3.87–5.11)
RDW: 13.6 % (ref 11.5–15.5)
WBC: 5 10*3/uL (ref 4.0–10.5)

## 2018-06-13 LAB — VITAMIN D 25 HYDROXY (VIT D DEFICIENCY, FRACTURES): VITD: 26.21 ng/mL — AB (ref 30.00–100.00)

## 2018-06-13 LAB — TSH: TSH: 4.65 u[IU]/mL — ABNORMAL HIGH (ref 0.35–4.50)

## 2018-06-13 MED ORDER — MUPIROCIN 2 % EX OINT: 1.0000 "application " | TOPICAL_OINTMENT | Freq: Two times a day (BID) | CUTANEOUS | 0 refills | Status: DC

## 2018-06-13 MED ORDER — METFORMIN HCL ER 500 MG PO TB24: 1000.0000 mg | ORAL_TABLET | Freq: Two times a day (BID) | ORAL | 3 refills | Status: DC

## 2018-06-13 NOTE — Patient Instructions (Signed)
Try cinnamon supplements 500 mg 1x per day  Debrox for ears periodically 1x per month 4-7 days at a time   Atrial Fibrillation Atrial fibrillation is a type of irregular or rapid heartbeat (arrhythmia). In atrial fibrillation, the top part of the heart (atria) quivers in a chaotic pattern. This makes the heart unable to pump blood normally. Having atrial fibrillation can increase your risk for other health problems, such as:  Blood can pool in the atria and form clots. If a clot travels to the brain, it can cause a stroke.  The heart muscle may weaken from the irregular blood flow. This can cause heart failure. Atrial fibrillation may start suddenly and stop on its own, or it may become a long-lasting problem. What are the causes? This condition is caused by some heart-related conditions or procedures, including:  High blood pressure. This is the most common cause.  Heart failure.  Heart valve conditions.  Inflammation of the sac that surrounds the heart (pericarditis).  Heart surgery.  Coronary artery disease.  Certain heart rhythm disorders, such as Wolf-Parkinson-White syndrome. Other causes include:  Pneumonia.  Obstructive sleep apnea.  Lung cancer.  Thyroid problems, especially if the thyroid is overactive (hyperthyroidism).  Excessive alcohol or drug use. Sometimes, the cause of this condition is not known. What increases the risk? This condition is more likely to develop in:  Older people.  People who smoke.  People who have diabetes mellitus.  People who are overweight (obese).  Athletes who exercise vigorously.  People who have a family history. What are the signs or symptoms? Symptoms of this condition include:  A feeling that your heart is beating rapidly or irregularly.  A feeling of discomfort or pain in your chest.  Shortness of breath.  Sudden light-headedness or weakness.  Getting tired easily during exercise. In some cases, there are  no symptoms. How is this diagnosed? Your health care provider may be able to detect atrial fibrillation when taking your pulse. If detected, this condition may be diagnosed with:  Electrocardiogram (ECG).  Ambulatory cardiac monitor. This device records your heartbeats for 24 hours or more.  Transthoracic echocardiogram (TTE) to evaluate how blood flows through your heart.  Transesophageal echocardiogram (TEE) to view more detailed images of your heart.  A stress test.  Imaging tests, such as a CT scan or chest X-ray.  Blood tests. How is this treated? This condition may be treated with:  Medicines to slow down the heart rate or bring the heart's rhythm back to normal.  Medicines to prevent blood clots from forming.  Electrical cardioversion. This delivers a low-energy shock to the heart to reset its rhythm.  Ablation. This procedure destroys the part of the heart tissue that sends abnormal signals.  Left atrial appendage occlusion/excision. This seals off a common place in the atria where blood clots can form (left atrial appendage). The goal of treatment is to prevent blood clots from forming and to keep your heart beating at a normal rate and rhythm. Treatment depends on underlying medical conditions and how you feel when you are experiencing fibrillation. Follow these instructions at home: Medicines  Take over-the counter and prescription medicines only as told by your health care provider.  If your health care provider prescribed a blood-thinning medicine (anticoagulant), take it exactly as told. Taking too much blood-thinning medicine can cause bleeding. Taking too little can enable a blood clot to form and travel to the brain, causing a stroke. Lifestyle  Do not use any products that contain nicotine or tobacco, such as cigarettes and e-cigarettes. If you need help quitting, ask your health care provider.  Do not drink beverages that contain caffeine, such as  coffee, soda, and tea.  Follow diet instructions as told by your health care provider.  Exercise regularly as told by your health care provider.  Do not drink alcohol. General instructions  If you have obstructive sleep apnea, manage your condition as told by your health care provider.  Maintain a healthy weight. Do not use diet pills unless your health care provider approves. Diet pills may make heart problems worse.  Keep all follow-up visits as told by your health care provider. This is important. Contact a health care provider if you:  Notice a change in the rate, rhythm, or strength of your heartbeat.  Are taking an anticoagulant and you notice increased bruising.  Tire more easily when you exercise or exert yourself.  Have a sudden change in weight. Get help right away if you have:   Chest pain, abdominal pain, sweating, or weakness.  Difficulty breathing.  Blood in your vomit, stool (feces), or urine.  Any symptoms of a stroke. "BE FAST" is an easy way to remember the main warning signs of a stroke: ? B - Balance. Signs are dizziness, sudden trouble walking, or loss of balance. ? E - Eyes. Signs are trouble seeing or a sudden change in vision. ? F - Face. Signs are sudden weakness or numbness of the face, or the face or eyelid drooping on one side. ? A - Arms. Signs are weakness or numbness in an arm. This happens suddenly and usually on one side of the body. ? S - Speech. Signs are sudden trouble speaking, slurred speech, or trouble understanding what people say. ? T - Time. Time to call emergency services. Write down what time symptoms started.  Other signs of a stroke, such as: ? A sudden, severe headache with no known cause. ? Nausea or vomiting. ? Seizure. These symptoms may represent a serious problem that is an emergency. Do not wait to see if the symptoms will go away. Get medical help right away. Call your local emergency services (911 in the [place] Do not  drive yourself to the hospital. Summary  Atrial fibrillation is a type of irregular or rapid heartbeat (arrhythmia).  Symptoms include a feeling that your heart is beating fast or irregularly. In some cases, you may not have symptoms.  The condition is treated with medicines to slow down the heart rate or bring the heart's rhythm back to normal. You may also need blood-thinning medicines to prevent blood clots.  Get help right away if you have symptoms or signs of a stroke. This information is not intended to replace advice given to you by your health care provider. Make sure you discuss any questions you have with your health care provider. Document Released: 05/16/2005 Document Revised: 07/07/2017 Document Reviewed: 07/07/2017 Elsevier Interactive Patient Education  2019 [employer].

## 2018-06-13 NOTE — Progress Notes (Signed)
Pre visit review using our clinic review tool, if applicable. No additional management support is needed unless otherwise documented below in the visit note. 

## 2018-06-13 NOTE — Progress Notes (Signed)
Chief Complaint  Patient presents with  . Follow-up   6 month f/u  1. Viral cough 01/2018 now resolved and listeria from chicken salad at [facility] 02/2018 diarrhea resolved  2. C/o sores in b/l nose and pain causing left neck gland to be enlarged 3. Afib currently in Afib today on prn Flecanide rate controlled on toprol XL 100 mg qd she is unsure about 05/18/18 with rec for med change to tickosyn 4. HTN BP sl elevated today on BB hctz 25 qd at home 119/86 this am prior to visit  5. DM 2 on metformin 1000 mg bid XR willing to try [place] if not controlled lower dose    Review of Systems  Constitutional: Negative for weight loss.  HENT: Negative for hearing loss.   Eyes: Negative for blurred vision.  Respiratory: Negative for cough.   Cardiovascular: Negative for chest pain.  Gastrointestinal: Negative for abdominal pain and diarrhea.  Skin: Negative for rash.  Neurological: Negative for headaches.  Psychiatric/Behavioral: Negative for depression. The patient does not have insomnia.    Past Medical History:  Diagnosis Date  . Chicken pox   . Coronary artery disease    a. 2004 s/p DES to mid-LAD; b. 10/2005 Cath: LM nl, LAD patent stent, 54m D1 nl, LCX nl, OM1/2 nl, RCA nl, EF 60%; c. 05/2015 MV: EF 83%, no ischemia. Low risk.  . Diastolic dysfunction    a. 09/2014 Echo: Nl EF. Gr1 DD. Very mild AS. Mild MR. Nl RV fxn. PASP 363mg.  . [place]  . Glaucoma   . Heart murmur   . Hyperlipidemia   . Hypertension   . [place] ([place]mal [place] [place]   a. 10/2014 Holter: PAF b. quit taking Eliquis in 01/2015 due to myalgias; c. CHA2DS2VASc = 6-->Xarelto.   Past Surgical History:  Procedure Laterality Date  . CARDIAC CATHETERIZATION     [place] x[place] stent  . coronary artery stent  2004  . TONSILLECTOMY AND ADENOIDECTOMY  1949   Family History  Problem Relation Age of Onset  . Hyperlipidemia Mother   . Hypertension Mother   . Hyperlipidemia Father    . Hypertension Father   . Heart disease Father 6510. Heart attack Father    Social History   Socioeconomic History  . Marital status: Divorced    Spouse name: Not on file  . Number of children: Not on file  . Years of education: Not on file  . Highest education level: Not on file  Occupational History  . Not on file  Social Needs  . Financial resource strain: Not on file  . Food insecurity:    Worry: Not on file    Inability: Not on file  . Transportation needs:    Medical: Not on file    Non-medical: Not on file  Tobacco Use  . Smoking status: Former SmResearch scientist (life sciences). Smokeless tobacco: Never Used  . Tobacco comment: quit 25 years ago  Substance and Sexual Activity  . Alcohol use: Yes    Alcohol/week: 0.0 standard drinks    Comment: very rarely  . Drug use: No  . Sexual activity: Not on file  Lifestyle  . Physical activity:    Days per week: Not on file    Minutes per session: Not on file  . Stress: Not on file  Relationships  . Social connections:    Talks on phone: Not on file    Gets together: Not on  file    Attends religious service: Not on file    Active member of club or organization: Not on file    Attends meetings of clubs or organizations: Not on file    Relationship status: Not on file  . Intimate partner violence:    Fear of current or ex partner: Not on file    Emotionally abused: Not on file    Physically abused: Not on file    Forced sexual activity: Not on file  Other Topics Concern  . Not on file  Social History Narrative   Lives in [place]. From [place] and moved here for her daughter, granddaughter. in home.      Work - retired.   Diet - regular   Exercise - [employer]   Current Meds  Medication Sig  . atorvastatin (LIPITOR) 20 MG tablet Take 1 tablet (20 mg total) by mouth at bedtime.  . flecainide (TAMBOCOR) 100 MG tablet Take 1 tablet (100 mg total) by mouth 2 (two) times daily as needed.  Marland Kitchen glucose blood (ONE TOUCH ULTRA TEST) test strip  1 each by Other route daily. Use as instructed  E11.9  . hydrochlorothiazide (HYDRODIURIL) 25 MG tablet Take 1 tablet (25 mg total) by mouth daily.  Marland Kitchen latanoprost (XALATAN) 0.005 % ophthalmic solution INSTILL 1 DROP INTO EACH EYE AT BEDTIME  . metFORMIN (GLUCOPHAGE-XR) 500 MG 24 hr tablet Take 2 tablets (1,000 mg total) by mouth 2 (two) times daily.  . metoprolol succinate (TOPROL-XL) 100 MG 24 hr tablet Take 1 tablet (100 mg total) by mouth as directed. 1 tablet Daily in the AM and 1/2 tablet in the PM as needed fast heart rate/palpitations  . ONETOUCH DELICA LANCETS 66Y MISC Use up to 4 times daily to check blood sugar.  . rivaroxaban (XARELTO) 20 MG TABS tablet Take 1 tablet (20 mg total) by mouth daily with supper.   Allergies  Allergen Reactions  . Niaspan [Niacin Er] Rash  . Januvia [Sitagliptin]     Joint pain, h/a, URI  . Lisinopril Cough   Recent Results (from the past 2160 hour(s))  HM DIABETES EYE EXAM     Status: None   Collection Time: 06/01/18 12:00 AM  Result Value Ref Range   HM Diabetic Eye Exam No Retinopathy No Retinopathy    Comment: AE 06/01/2018   Objective  Body mass index is 39.1 kg/m. Wt Readings from Last 3 Encounters:  06/13/18 227 lb 12.8 oz (103.3 kg)  05/18/18 232 lb (105.2 kg)  02/27/18 230 lb (104.3 kg)   Temp Readings from Last 3 Encounters:  06/13/18 98.3 F (36.8 C) (Oral)  02/27/18 98.2 F (36.8 C) (Oral)  01/29/18 98.1 F (36.7 C) (Oral)   BP Readings from Last 3 Encounters:  06/13/18 140/82  05/18/18 (!) 150/80  02/27/18 117/80   Pulse Readings from Last 3 Encounters:  06/13/18 60  05/18/18 96  02/27/18 78    Physical Exam Vitals signs and nursing note reviewed.  Constitutional:      Appearance: Normal appearance. She is well-developed.  HENT:     Head: Normocephalic and atraumatic.     Nose: Nose normal.     Mouth/Throat:     Mouth: Mucous membranes are moist.     Pharynx: Oropharynx is clear.  Eyes:      Conjunctiva/sclera: Conjunctivae normal.     Pupils: Pupils are equal, round, and reactive to light.  Cardiovascular:     Rate and Rhythm: Bradycardia present. Rhythm irregular.  Heart sounds: Normal heart sounds.     Comments: In Afib today  Pulmonary:     Effort: Pulmonary effort is normal.     Breath sounds: Normal breath sounds.  Skin:    General: Skin is warm and dry.  Neurological:     General: No focal deficit present.     Mental Status: She is alert and oriented to person, place, and time.     Gait: Gait normal.  Psychiatric:        Attention and Perception: Attention and perception normal.        Mood and Affect: Mood and affect normal.        Speech: Speech normal.        Behavior: Behavior normal. Behavior is cooperative.        Thought Content: Thought content normal.        Cognition and Memory: Cognition and memory normal.        Judgment: Judgment normal.     Assessment   1. Sores in nose  2. Afib  3. HTN  4. DM 2 A1C 7.3  5. HM 6. Elevated TSH Plan   1bactroban bid x 5 days  2. Disc with Dr. Rockey Situ add ARB for BP vs Sharolyn Douglas rec Tikosyn over flecainide pt on prn Flecainide wants to disc med changes with D.r Rockey Situ  Repeat TSH today  3. Monitor BP consider add ARB in future  4. Cont meds  Labs today  Foot exam normal today except clavi  5.  Had flu shot  prevnar had and pna 23 Zoster had, declines shingrix Tdap utd   DEXA 04/07/16 neg, colonoscopy had, out of age age window pap, mammo declines  rec calcium 600 mg bid, vit D 3 1000 IU qd pt declines supplements  Declines MMR and UA and vit D check  6. Repeat TSH   Provider: Dr. Olivia Mackie McLean-Scocuzza-Internal Medicine

## 2018-06-14 ENCOUNTER — Other Ambulatory Visit: Payer: Self-pay | Admitting: Internal Medicine

## 2018-06-14 DIAGNOSIS — R946 Abnormal results of thyroid function studies: Secondary | ICD-10-CM

## 2018-06-14 DIAGNOSIS — E1165 Type 2 diabetes mellitus with hyperglycemia: Secondary | ICD-10-CM

## 2018-06-14 LAB — MICROALBUMIN / CREATININE URINE RATIO
CREATININE, UR: 94.9 mg/dL
Microalb/Creat Ratio: 34.8 mg/g creat — ABNORMAL HIGH (ref 0.0–30.0)
Microalbumin, Urine: 33 ug/mL

## 2018-06-14 LAB — URINALYSIS, ROUTINE W REFLEX MICROSCOPIC
BILIRUBIN UA: NEGATIVE
GLUCOSE, UA: NEGATIVE
KETONES UA: NEGATIVE
LEUKOCYTES UA: NEGATIVE
Nitrite, UA: NEGATIVE
PROTEIN UA: NEGATIVE
RBC UA: NEGATIVE
SPEC GRAV UA: 1.019 (ref 1.005–1.030)
UUROB: 0.2 mg/dL (ref 0.2–1.0)
pH, UA: 5 (ref 5.0–7.5)

## 2018-06-14 MED ORDER — SITAGLIPTIN PHOSPHATE 25 MG PO TABS: 25.0000 mg | ORAL_TABLET | Freq: Every day | ORAL | 3 refills | Status: DC

## 2018-06-29 DIAGNOSIS — H401423 Capsular glaucoma with pseudoexfoliation of lens, left eye, severe stage: Secondary | ICD-10-CM | POA: Diagnosis not present

## 2018-07-16 NOTE — Progress Notes (Signed)
Cardiology Office Note  Date:  07/17/2018   ID:  Gabriela Reed, DOB 03/09/1941, MRN [id number]  PCP:  McLean-Scocuzza, Pasty Spillersracy N, MD   Chief Complaint  Patient presents with  . other    8 wk f/u no complaints today. Meds reviewed verbally with pt.    HPI:  Gabriela Reed is a 78 y.o.  woman with history of  morbid obesity, Diabetes type 2 paroxysmal atrial fibrillation,  coronary artery disease with stent to the LAD in 2004, presenting for routine follow-up of her coronary artery disease and PAF  Gastro 02/2018 Atrial; fib noted 04/2018 Started on flecainide had side effects, SOB, tired, dirrhea, H/A Stopped the pill after 10 days Still in atrial fibrillation Taking flecainide as needed Only taking metoprolol 100 daily, rarely 50 extra  Feels ok No significant SOB Occasional palpitations  Try taking extra metoprolol on a regular basis Had to cut back on the metoprolol , made tinnitus worse  Started on januvia Wonders if this could have caused complications  [place]  EKG personally reviewed by myself on todays visit shows Atrial fibrillation with rate 89 bpm  Other past medical history reviewed  Prior stress test 06/25/2015 showing no ischemia   hospital June 25 2015 with chest pain, paroxysmal atrial fibrillation The daytime was not on anticoagulation. Long discussion, she agreed to restart anticoagulation  November 27, 2014:  event monitor and was found to have atrial fibrillation .    PMH:   has a past medical history of Chicken pox, Coronary artery disease, Diastolic dysfunction, DM (diabetes mellitus), type 2 (HCC), Glaucoma, Heart murmur, Hyperlipidemia, Hypertension, and PAF (paroxysmal atrial fibrillation) (HCC).  PSH:    Past Surgical History:  Procedure Laterality Date  . CARDIAC CATHETERIZATION     [place] x1 stent  . coronary artery stent  2004  . TONSILLECTOMY AND ADENOIDECTOMY  1949    Current Outpatient Medications   Medication Sig Dispense Refill  . atorvastatin (LIPITOR) 20 MG tablet Take 1 tablet (20 mg total) by mouth at bedtime. 90 tablet 3  . flecainide (TAMBOCOR) 100 MG tablet Take 1 tablet (100 mg total) by mouth 2 (two) times daily as needed. 60 tablet 6  . glucose blood (ONE TOUCH ULTRA TEST) test strip 1 each by Other route daily. Use as instructed  E11.9 100 each 3  . hydrochlorothiazide (HYDRODIURIL) 25 MG tablet Take 1 tablet (25 mg total) by mouth daily. 90 tablet 3  . latanoprost (XALATAN) 0.005 % ophthalmic solution INSTILL 1 DROP INTO EACH EYE AT BEDTIME  5  . metFORMIN (GLUCOPHAGE-XR) 500 MG 24 hr tablet Take 2 tablets (1,000 mg total) by mouth 2 (two) times daily. 360 tablet 3  . metoprolol succinate (TOPROL-XL) 100 MG 24 hr tablet Take 1 tablet (100 mg total) by mouth as directed. 1 tablet Daily in the AM and 1/2 tablet in the PM as needed fast heart rate/palpitations 135 tablet 3  . ONETOUCH DELICA LANCETS 33G MISC Use up to 4 times daily to check blood sugar. 100 each 11  . rivaroxaban (XARELTO) 20 MG TABS tablet Take 1 tablet (20 mg total) by mouth daily with supper. 30 tablet 5  . sitaGLIPtin (JANUVIA) 25 MG tablet Take 1 tablet (25 mg total) by mouth daily. 90 tablet 3   No current facility-administered medications for this visit.      Allergies:   Niaspan [niacin er]; Januvia [sitagliptin]; and Lisinopril   Social History:  The patient  reports that she  has quit smoking. She has never used smokeless tobacco. She reports current alcohol use. She reports that she does not use drugs.   Family History:   family history includes Heart attack in her father; Heart disease (age of onset: 23) in her father; Hyperlipidemia in her father and mother; Hypertension in her father and mother.    Review of Systems: Review of Systems  Constitutional: Negative.   Respiratory: Negative.   Cardiovascular: Positive for palpitations.  Gastrointestinal: Negative.   Musculoskeletal: Negative.    Neurological: Negative.   Psychiatric/Behavioral: Negative.   All other systems reviewed and are negative.    PHYSICAL EXAM: VS:  BP (!) 148/100 (BP Location: Left Arm, Patient Position: Sitting, Cuff Size: Large)   Pulse 89   Ht 5\' 4"  (1.626 m)   Wt 229 lb 8 oz (104.1 kg)   BMI 39.39 kg/m  , BMI Body mass index is 39.39 kg/m.  Constitutional:  oriented to person, place, and time. No distress.  HENT:  Head: Grossly normal Eyes:  no discharge. No scleral icterus.  Neck: No JVD, no carotid bruits  Cardiovascular: Irregularly irregular,  no murmurs appreciated Pulmonary/Chest: Clear to auscultation bilaterally, no wheezes or rails Abdominal: Soft.  no distension.  no tenderness.  Musculoskeletal: Normal range of motion Neurological:  normal muscle tone. Coordination normal. No atrophy Skin: Skin warm and dry Psychiatric: normal affect, pleasant   Recent Labs: 06/13/2018: ALT 23; BUN 17; Creatinine, Ser 0.86; Hemoglobin 14.0; Platelets 193.0; Potassium 3.9; Sodium 137; TSH 4.65    Lipid Panel Lab Results  Component Value Date   CHOL 122 06/13/2018   HDL 39.60 06/13/2018   LDLCALC 61 06/13/2018   TRIG 108.0 06/13/2018      Wt Readings from Last 3 Encounters:  07/17/18 229 lb 8 oz (104.1 kg)  06/13/18 227 lb 12.8 oz (103.3 kg)  05/18/18 232 lb (105.2 kg)       ASSESSMENT AND PLAN:  Paroxysmal atrial fibrillation (HCC) Asymptomatic, maintaining her usual level of activity Has been in atrial fibrillation likely for several months Stops and starts her various medications if she does not feel well Stressed importance of compliance with her Xarelto Continue metoprolol succinate 100 mg daily Suggested she try again extra half metoprolol in the evening as she does have occasional palpitations We will stop the flecainide as she does not want to take lots of pills and does not want cardioversion She asked about cardioversion, discussed various treatment options with  her We will try alternate medication such as amiodarone, propafenone or Tikosyn before ablation She is not particularly interested in medications and again sometimes stops and starts -For now stay on anticoagulation with metoprolol as she feels well  Coronary artery disease of native artery of native heart with stable angina pectoris (HCC) Stable with no anginal symptoms No further testing  Essential hypertension Recommend extra half dose metoprolol as tolerated for blood pressure control, take at nighttime Blood pressure on the high side at home, was elevated here At home was 130s over 90  Hyperlipidemia with target LDL less than 70 Cholesterol at goal, no changes made  Type 2 diabetes mellitus with other circulatory complication, without long-term current use of insulin (HCC) Recommended walking program, low carbohydrate diet Managed by primary care  Morbid obesity (HCC) Previously with weight drop in 2019 now has stabilized still elevated   Total encounter time more than 25 minutes  Greater than 50% was spent in counseling and coordination of care with the patient  Disposition:   F/U  6 months   No orders of the defined types were placed in this encounter.    Signed, Dossie Arbour, M.D., Ph.D. 07/17/2018  [facility] [place], [place] [phone]

## 2018-07-17 ENCOUNTER — Encounter: Payer: Self-pay | Admitting: Cardiovascular Disease

## 2018-07-17 ENCOUNTER — Ambulatory Visit (INDEPENDENT_AMBULATORY_CARE_PROVIDER_SITE_OTHER): Payer: PPO | Admitting: Cardiovascular Disease

## 2018-07-17 VITALS — BP 148/100 | HR 89 | Ht 64.0 in | Wt 229.5 lb

## 2018-07-17 DIAGNOSIS — E785 Hyperlipidemia, unspecified: Secondary | ICD-10-CM

## 2018-07-17 DIAGNOSIS — I10 Essential (primary) hypertension: Secondary | ICD-10-CM

## 2018-07-17 DIAGNOSIS — I25118 Atherosclerotic heart disease of native coronary artery with other forms of angina pectoris: Secondary | ICD-10-CM | POA: Diagnosis not present

## 2018-07-17 DIAGNOSIS — I4819 Other persistent atrial fibrillation: Secondary | ICD-10-CM | POA: Diagnosis not present

## 2018-07-17 DIAGNOSIS — E119 Type 2 diabetes mellitus without complications: Secondary | ICD-10-CM | POA: Diagnosis not present

## 2018-07-17 NOTE — Patient Instructions (Addendum)
Medication Instructions:  Please stop the flecainide Take extra 1/2 metoprolol as needed for tachycardia  Monitor blood pressure at home  If you need a refill on your cardiac medications before your next appointment, please call your pharmacy.    Lab work: No new labs needed   If you have labs (blood work) drawn today and your tests are completely normal, you will receive your results only by: Marland Kitchen MyChart Message (if you have MyChart) OR . A paper copy in the mail If you have any lab test that is abnormal or we need to change your treatment, we will call you to review the results.   Testing/Procedures: No new testing needed   Follow-Up: At [facility], you and your health needs are our priority.  As part of our continuing mission to provide you with exceptional heart care, we have created designated Provider Care Teams.  These Care Teams include your primary Cardiologist (physician) and Advanced Practice Providers (APPs -  Physician Assistants and Nurse Practitioners) who all work together to provide you with the care you need, when you need it.  . You will need a follow up appointment in 6 months .   Please call our office 2 months in advance to schedule this appointment.    . Providers on your designated Care Team:   . Nicolasa Ducking, NP . Eula Listen, PA-C . Marisue Ivan, PA-C  Any Other Special Instructions Will Be Listed Below (If Applicable).  For educational health videos Log in to : [email or web address] Or : [email or web address], password : [email or web address]

## 2018-07-18 DIAGNOSIS — M5136 Other intervertebral disc degeneration, lumbar region: Secondary | ICD-10-CM | POA: Diagnosis not present

## 2018-07-18 DIAGNOSIS — M9903 Segmental and somatic dysfunction of lumbar region: Secondary | ICD-10-CM | POA: Diagnosis not present

## 2018-07-18 DIAGNOSIS — M9901 Segmental and somatic dysfunction of cervical region: Secondary | ICD-10-CM | POA: Diagnosis not present

## 2018-07-18 DIAGNOSIS — M5412 Radiculopathy, cervical region: Secondary | ICD-10-CM | POA: Diagnosis not present

## 2018-10-29 ENCOUNTER — Other Ambulatory Visit: Payer: Self-pay

## 2018-10-29 MED ORDER — RIVAROXABAN 20 MG PO TABS: 20.0000 mg | ORAL_TABLET | Freq: Every day | ORAL | 5 refills | Status: DC

## 2018-10-29 NOTE — Telephone Encounter (Signed)
*  STAT* If patient is at the pharmacy, call can be transferred to refill team.   1. Which medications need to be refilled? (please list name of each medication and dose if known) Xarelto  2. Which pharmacy/location (including street and city if local pharmacy) is medication to be sent to? [facility] [address]  3. Do they need a 30 day or 90 day supply? 90

## 2018-10-29 NOTE — Telephone Encounter (Signed)
Please review for refill. Thanks!  

## 2018-10-30 DIAGNOSIS — H401423 Capsular glaucoma with pseudoexfoliation of lens, left eye, severe stage: Secondary | ICD-10-CM | POA: Diagnosis not present

## 2018-11-05 DIAGNOSIS — M9901 Segmental and somatic dysfunction of cervical region: Secondary | ICD-10-CM | POA: Diagnosis not present

## 2018-11-05 DIAGNOSIS — M9903 Segmental and somatic dysfunction of lumbar region: Secondary | ICD-10-CM | POA: Diagnosis not present

## 2018-11-05 DIAGNOSIS — M5136 Other intervertebral disc degeneration, lumbar region: Secondary | ICD-10-CM | POA: Diagnosis not present

## 2018-11-05 DIAGNOSIS — M5412 Radiculopathy, cervical region: Secondary | ICD-10-CM | POA: Diagnosis not present

## 2018-11-13 ENCOUNTER — Other Ambulatory Visit: Payer: Self-pay | Admitting: Internal Medicine

## 2018-11-13 MED ORDER — ATORVASTATIN CALCIUM 20 MG PO TABS: 20.0000 mg | ORAL_TABLET | Freq: Every day | ORAL | 3 refills | Status: DC

## 2018-11-27 ENCOUNTER — Other Ambulatory Visit: Payer: Self-pay | Admitting: Internal Medicine

## 2018-11-27 MED ORDER — HYDROCHLOROTHIAZIDE 25 MG PO TABS: 25.0000 mg | ORAL_TABLET | Freq: Every day | ORAL | 3 refills | Status: DC

## 2018-12-04 DIAGNOSIS — H401492 Capsular glaucoma with pseudoexfoliation of lens, unspecified eye, moderate stage: Secondary | ICD-10-CM | POA: Diagnosis not present

## 2018-12-10 DIAGNOSIS — H401123 Primary open-angle glaucoma, left eye, severe stage: Secondary | ICD-10-CM | POA: Diagnosis not present

## 2018-12-12 ENCOUNTER — Ambulatory Visit (INDEPENDENT_AMBULATORY_CARE_PROVIDER_SITE_OTHER): Payer: PPO | Admitting: Internal Medicine

## 2018-12-12 ENCOUNTER — Other Ambulatory Visit: Payer: Self-pay

## 2018-12-12 DIAGNOSIS — I10 Essential (primary) hypertension: Secondary | ICD-10-CM | POA: Diagnosis not present

## 2018-12-12 DIAGNOSIS — E559 Vitamin D deficiency, unspecified: Secondary | ICD-10-CM

## 2018-12-12 DIAGNOSIS — R946 Abnormal results of thyroid function studies: Secondary | ICD-10-CM | POA: Diagnosis not present

## 2018-12-12 DIAGNOSIS — E1165 Type 2 diabetes mellitus with hyperglycemia: Secondary | ICD-10-CM

## 2018-12-12 DIAGNOSIS — Z1329 Encounter for screening for other suspected endocrine disorder: Secondary | ICD-10-CM

## 2018-12-12 MED ORDER — METFORMIN HCL 500 MG PO TABS: 1000.0000 mg | ORAL_TABLET | Freq: Two times a day (BID) | ORAL | 3 refills | Status: DC

## 2018-12-12 MED ORDER — ONETOUCH DELICA LANCETS 33G MISC: 5 refills | Status: DC

## 2018-12-12 NOTE — Progress Notes (Signed)
Virtual Visit via Video Note  I connected with Gabriela Reed   on 12/12/18 at  8:30 AM EDT by a video enabled telemedicine application and verified that I am speaking with the correct person using two identifiers.  Location patient: daughters office  Location provider:work  Persons participating in the virtual visit: patient, provider  I discussed the limitations of evaluation and management by telemedicine and the availability of in person appointments. The patient expressed understanding and agreed to proceed.   HPI: 1. HTN elevated 148/100 at times 120s-130s top and she is no longer getting h/a on Toprol 100 mg xl and not really taking 50 mg xl prn for racing heart  2. Nose sores improved  3.DM 2 last Z1C 8.6 on metformin 1000 mg bid ER and januvia 25 mg qd will check labs upcoming saw eye MD 12/10/18 left eye glaucoma    ROS: See pertinent positives and negatives per HPI.  Past Medical History:  Diagnosis Date  . Chicken pox   . Coronary artery disease    a. 2004 s/p DES to mid-LAD; b. 10/2005 Cath: LM nl, LAD patent stent, 46m D1 nl, LCX nl, OM1/2 nl, RCA nl, EF 60%; c. 05/2015 MV: EF 83%, no ischemia. Low risk.  . Diastolic dysfunction    a. 09/2014 Echo: Nl EF. Gr1 DD. Very mild AS. Mild MR. Nl RV fxn. PASP 364mg.  . DM (diabetes mellitus), type 2 ([facility]  . Glaucoma   . Heart murmur   . Hyperlipidemia   . Hypertension   . PAF (paroxysmal atrial fibrillation) ([place]   a. 10/2014 Holter: PAF b. quit taking Eliquis in 01/2015 due to myalgias; c. CHA2DS2VASc = 6-->Xarelto.    Past Surgical History:  Procedure Laterality Date  . CARDIAC CATHETERIZATION     [place] x1 stent  . coronary artery stent  2004  . TONSILLECTOMY AND ADENOIDECTOMY  1949    Family History  Problem Relation Age of Onset  . Hyperlipidemia Mother   . Hypertension Mother   . Hyperlipidemia Father   . Hypertension Father   . Heart disease Father 6534. Heart attack Father     SOCIAL HX:   Retried    Current Outpatient Medications:  .  Brinzolamide-Brimonidine (SIMBRINZA) 1-0.2 % SUSP, Apply to eye. Left eye, Disp: , Rfl:  .  atorvastatin (LIPITOR) 20 MG tablet, Take 1 tablet (20 mg total) by mouth at bedtime., Disp: 90 tablet, Rfl: 3 .  glucose blood (ONE TOUCH ULTRA TEST) test strip, 1 each by Other route daily. Use as instructed  E11.9, Disp: 100 each, Rfl: 3 .  hydrochlorothiazide (HYDRODIURIL) 25 MG tablet, Take 1 tablet (25 mg total) by mouth daily., Disp: 90 tablet, Rfl: 3 .  latanoprost (XALATAN) 0.005 % ophthalmic solution, INSTILL 1 DROP INTO EACH EYE AT BEDTIME, Disp: , Rfl: 5 .  metFORMIN (GLUCOPHAGE) 500 MG tablet, Take 2 tablets (1,000 mg total) by mouth 2 (two) times daily with a meal. ER formulation, Disp: 340 tablet, Rfl: 3 .  metoprolol succinate (TOPROL-XL) 100 MG 24 hr tablet, Take 1 tablet (100 mg total) by mouth as directed. 1 tablet Daily in the AM and 1/2 tablet in the PM as needed fast heart rate/palpitations, Disp: 135 tablet, Rfl: 3 .  OneTouch Delica Lancets 3394MISC, Use up to 3 times daily to check blood sugar., Disp: 300 each, Rfl: 5 .  rivaroxaban (XARELTO) 20 MG TABS tablet, Take 1 tablet (20 mg total) by mouth daily  with supper., Disp: 30 tablet, Rfl: 5 .  sitaGLIPtin (JANUVIA) 25 MG tablet, Take 1 tablet (25 mg total) by mouth daily., Disp: 90 tablet, Rfl: 3  EXAM:  VITALS per patient if applicable:  GENERAL: alert, oriented, appears well and in no acute distress  HEENT: atraumatic, conjunttiva clear, no obvious abnormalities on inspection of external nose and ears  NECK: normal movements of the head and neck  LUNGS: on inspection no signs of respiratory distress, breathing rate appears normal, no obvious gross SOB, gasping or wheezing  CV: no obvious cyanosis  MS: moves all visible extremities without noticeable abnormality  PSYCH/NEURO: pleasant and cooperative, no obvious depression or anxiety, speech and thought processing grossly  intact  ASSESSMENT AND PLAN:  Discussed the following assessment and plan:  Essential hypertension - Plan:  Fasting labs  Cont meds  Disc with pt today add norvasc 2.5 mg qd to toprol xl 100 mg and and hctz 25 mg qd if BP not controlled  RN visit at lab visit   Type 2 diabetes mellitus with hyperglycemia,A1C 8.6 - Plan:  Fasting labs  Cont meds for now has ER 1000 metformin bid not on recall  Lancets refilled  Eye exam 12/10/2018 AE upcoming f/u soon will get notes faxed   HM Had flu shot  prevnar hadand pna 25 Zoster had, declines shingrix Tdap utd   DEXA 04/07/16 neg, colonoscopy had, out of age age window pap, mammo declines mammo again 12/12/18   rec calcium 600 mg bid, vit D 3 1000 IU qdpt declines supplements  Declines MMR Eye 12/10/18 saw Dr. Mordecai Rasmussen glaucoma L eye    I discussed the assessment and treatment plan with the patient. The patient was provided an opportunity to ask questions and all were answered. The patient agreed with the plan and demonstrated an understanding of the instructions.   The patient was advised to call back or seek an in-person evaluation if the symptoms worsen or if the condition fails to improve as anticipated.  Time spent 25 minutes  Delorise Jackson, MD

## 2018-12-25 ENCOUNTER — Ambulatory Visit (INDEPENDENT_AMBULATORY_CARE_PROVIDER_SITE_OTHER): Payer: PPO

## 2018-12-25 ENCOUNTER — Other Ambulatory Visit (INDEPENDENT_AMBULATORY_CARE_PROVIDER_SITE_OTHER): Payer: PPO

## 2018-12-25 ENCOUNTER — Other Ambulatory Visit: Payer: Self-pay

## 2018-12-25 VITALS — BP 120/70 | HR 65

## 2018-12-25 DIAGNOSIS — I10 Essential (primary) hypertension: Secondary | ICD-10-CM

## 2018-12-25 DIAGNOSIS — Z1329 Encounter for screening for other suspected endocrine disorder: Secondary | ICD-10-CM | POA: Diagnosis not present

## 2018-12-25 DIAGNOSIS — E1165 Type 2 diabetes mellitus with hyperglycemia: Secondary | ICD-10-CM

## 2018-12-25 DIAGNOSIS — E559 Vitamin D deficiency, unspecified: Secondary | ICD-10-CM

## 2018-12-25 DIAGNOSIS — R946 Abnormal results of thyroid function studies: Secondary | ICD-10-CM | POA: Diagnosis not present

## 2018-12-25 LAB — COMPREHENSIVE METABOLIC PANEL
ALT: 34 U/L (ref 0–35)
AST: 31 U/L (ref 0–37)
Albumin: 4.8 g/dL (ref 3.5–5.2)
Alkaline Phosphatase: 55 U/L (ref 39–117)
BUN: 23 mg/dL (ref 6–23)
CO2: 28 mEq/L (ref 19–32)
Calcium: 10 mg/dL (ref 8.4–10.5)
Chloride: 101 mEq/L (ref 96–112)
Creatinine, Ser: 0.82 mg/dL (ref 0.40–1.20)
GFR: 67.78 mL/min (ref >60.00)
Glucose, Bld: 156 mg/dL — ABNORMAL HIGH (ref 70–99)
Potassium: 3.9 mEq/L (ref 3.5–5.1)
Sodium: 140 mEq/L (ref 135–145)
Total Bilirubin: 0.9 mg/dL (ref 0.2–1.2)
Total Protein: 7.5 g/dL (ref 6.0–8.3)

## 2018-12-25 LAB — LIPID PANEL
Cholesterol: 129 mg/dL (ref 0–200)
HDL: 44.2 mg/dL (ref >39.00)
LDL Cholesterol: 62 mg/dL (ref 0–99)
NonHDL: 84.79
Total CHOL/HDL Ratio: 3
Triglycerides: 113 mg/dL (ref 0.0–149.0)
VLDL: 22.6 mg/dL (ref 0.0–40.0)

## 2018-12-25 LAB — HEMOGLOBIN A1C: Hgb A1c MFr Bld: 7.7 % — ABNORMAL HIGH (ref 4.6–6.5)

## 2018-12-25 LAB — CBC WITH DIFFERENTIAL/PLATELET
Basophils Absolute: 0 10*3/uL (ref 0.0–0.1)
Basophils Relative: 0.4 % (ref 0.0–3.0)
Eosinophils Absolute: 0.1 10*3/uL (ref 0.0–0.7)
Eosinophils Relative: 1.7 % (ref 0.0–5.0)
HCT: 42.1 % (ref 36.0–46.0)
Hemoglobin: 13.9 g/dL (ref 12.0–15.0)
Lymphocytes Relative: 33.4 % (ref 12.0–46.0)
Lymphs Abs: 1.8 10*3/uL (ref 0.7–4.0)
MCHC: 33.1 g/dL (ref 30.0–36.0)
MCV: 86.8 fl (ref 78.0–100.0)
Monocytes Absolute: 0.5 10*3/uL (ref 0.1–1.0)
Monocytes Relative: 9.6 % (ref 3.0–12.0)
Neutro Abs: 2.9 10*3/uL (ref 1.4–7.7)
Neutrophils Relative %: 54.9 % (ref 43.0–77.0)
Platelets: 184 10*3/uL (ref 150.0–400.0)
RBC: 4.85 Mil/uL (ref 3.87–5.11)
RDW: 14.1 % (ref 11.5–15.5)
WBC: 5.3 10*3/uL (ref 4.0–10.5)

## 2018-12-25 LAB — T4, FREE: Free T4: 0.92 ng/dL (ref 0.60–1.60)

## 2018-12-25 LAB — VITAMIN D 25 HYDROXY (VIT D DEFICIENCY, FRACTURES): VITD: 21.74 ng/mL — ABNORMAL LOW (ref 30.00–100.00)

## 2018-12-25 LAB — TSH: TSH: 4.31 u[IU]/mL (ref 0.35–4.50)

## 2018-12-25 LAB — T3, FREE: T3, Free: 3.3 pg/mL (ref 2.3–4.2)

## 2018-12-25 NOTE — Progress Notes (Signed)
Patient presented today for nurse visit BP check per MD order from [place] note 12/12/18.   Patient reports compliance with prescribed BP medications: YES  Last dose of BP medication:  This morning @ 6:30 am.  BP Readings from Last 3 Encounters:  12/25/18 120/70  07/17/18 (!) 148/100  06/13/18 140/82   Pulse Readings from Last 3 Encounters:  12/25/18 65  07/17/18 89  06/13/18 60    Patient gave blood pressure reading log from home bp checks for Dr. Claris Gladden review.  Home readings below:  Date BP HR  7/15 118/74 98  7/16 123/75 84  7/16 110/73   (2 pm) 91  7/17 123/77 81  7/18 110/73 89  7/19 117/79 78  7/20 113/70 84  7/21 118/75 84  7/22 123/80 86  7/23 119/76 79  7/24 139/78 84  7/25 110/73 90  7/26 107/70 82  7/27 125/70 72  7/28 118/79 80

## 2018-12-26 ENCOUNTER — Telehealth: Payer: Self-pay | Admitting: Internal Medicine

## 2018-12-26 LAB — THYROID PEROXIDASE ANTIBODY: Thyroperoxidase Ab SerPl-aCnc: <1 IU/mL (ref <9)

## 2018-12-26 NOTE — Telephone Encounter (Signed)
Patient returning call regarding lab results  and declines starting new medication "glipizide" at this time.

## 2018-12-26 NOTE — Progress Notes (Signed)
Blood pressure is much better   Thank you   We at at the goal <130/<80  [place]

## 2019-01-11 DIAGNOSIS — H401423 Capsular glaucoma with pseudoexfoliation of lens, left eye, severe stage: Secondary | ICD-10-CM | POA: Diagnosis not present

## 2019-01-19 NOTE — Progress Notes (Signed)
Cardiology Office Note  Date:  01/21/2019   ID:  Gabriela Reed, DOB 08/01/1940, MRN [id number]  PCP:  McLean-Scocuzza, Pasty Spillersracy N, MD   Chief Complaint  Patient presents with  . Other    6 month follow up. Meds reviewed verbally with patient.     HPI:  Gabriela Reed is a 78 y.o.  woman with history of  morbid obesity, Diabetes type 2 paroxysmal atrial fibrillation,  coronary artery disease with stent to the LAD in 2004, presenting for routine follow-up of her coronary artery disease and PAF  Feels well, no symptoms of SOB Takes metoprolol 100 in the AM Pm 1/2 dose sometimes makes ears ring Take extra 1/2 dose in the PM for palpitations  Try taking extra metoprolol on a regular basis Had to cut back on the metoprolol , made tinnitus worse  on januvia HGBA1C 7.7  EKG personally reviewed by myself on todays visit shows Atrial fibrillation with rate 91 bpm  Other past medical history reviewed [facility]urette SchimkeGastro 02/2018 Atrial; fib noted 04/2018 Started on flecainide had side effects, SOB, tired, dirrhea, H/A Stopped the pill after 10 days Still in atrial fibrillation Taking flecainide as needed Only taking metoprolol 100 daily, rarely 50 extra   Prior stress test 06/25/2015 showing no ischemia   hospital June 25 2015 with chest pain, paroxysmal atrial fibrillation  Long discussion, she agreed to restart anticoagulation  November 27, 2014:  event monitor and was found to have atrial fibrillation .   PMH:   has a past medical history of Chicken pox, Coronary artery disease, Diastolic dysfunction, DM (diabetes mellitus), type 2 (HCC), Glaucoma, Heart murmur, Hyperlipidemia, Hypertension, and PAF (paroxysmal atrial fibrillation) (HCC).  PSH:    Past Surgical History:  Procedure Laterality Date  . CARDIAC CATHETERIZATION     [place] x1 stent  . coronary artery stent  2004  . TONSILLECTOMY AND ADENOIDECTOMY  1949    Current Outpatient Medications  Medication Sig Dispense  Refill  . atorvastatin (LIPITOR) 20 MG tablet Take 1 tablet (20 mg total) by mouth at bedtime. 90 tablet 3  . glucose blood (ONE TOUCH ULTRA TEST) test strip 1 each by Other route daily. Use as instructed  E11.9 100 each 3  . hydrochlorothiazide (HYDRODIURIL) 25 MG tablet Take 1 tablet (25 mg total) by mouth daily. 90 tablet 3  . latanoprost (XALATAN) 0.005 % ophthalmic solution INSTILL 1 DROP INTO EACH EYE AT BEDTIME  5  . metFORMIN (GLUCOPHAGE) 500 MG tablet Take 2 tablets (1,000 mg total) by mouth 2 (two) times daily with a meal. ER formulation 340 tablet 3  . metoprolol succinate (TOPROL-XL) 100 MG 24 hr tablet Take 1 tablet (100 mg total) by mouth as directed. 1 tablet Daily in the AM and 1/2 tablet in the PM as needed fast heart rate/palpitations 135 tablet 3  . OneTouch Delica Lancets 33G MISC Use up to 3 times daily to check blood sugar. 300 each 5  . rivaroxaban (XARELTO) 20 MG TABS tablet Take 1 tablet (20 mg total) by mouth daily with supper. 30 tablet 5  . sitaGLIPtin (JANUVIA) 25 MG tablet Take 1 tablet (25 mg total) by mouth daily. 90 tablet 3   No current facility-administered medications for this visit.      Allergies:   Niaspan [niacin er], Januvia [sitagliptin], and Lisinopril   Social History:  The patient  reports that she has quit smoking. She has never used smokeless tobacco. She reports current alcohol use. She reports that she does  not use drugs.   Family History:   family history includes Heart attack in her father; Heart disease (age of onset: 78) in her father; Hyperlipidemia in her father and mother; Hypertension in her father and mother.    Review of Systems: Review of Systems  Constitutional: Negative.   HENT: Negative.   Respiratory: Negative.   Cardiovascular: Positive for palpitations.  Gastrointestinal: Negative.   Musculoskeletal: Negative.   Neurological: Negative.   Psychiatric/Behavioral: Negative.   All other systems reviewed and are  negative.    PHYSICAL EXAM: VS:  BP (!) 158/80 (BP Location: Left Arm, Patient Position: Sitting, Cuff Size: Large)   Pulse 92   Ht 5\' 4"  (1.626 m)   Wt 231 lb (104.8 kg)   BMI 39.65 kg/m  , BMI Body mass index is 39.65 kg/m.  Constitutional:  oriented to person, place, and time. No distress.  HENT:  Head: Grossly normal Eyes:  no discharge. No scleral icterus.  Neck: No JVD, no carotid bruits  Cardiovascular: Irregularly irregular,  no murmurs appreciated Pulmonary/Chest: Clear to auscultation bilaterally, no wheezes or rails Abdominal: Soft.  no distension.  no tenderness.  Musculoskeletal: Normal range of motion Neurological:  normal muscle tone. Coordination normal. No atrophy Skin: Skin warm and dry Psychiatric: normal affect, pleasant   Recent Labs: 12/25/2018: ALT 34; BUN 23; Creatinine, Ser 0.82; Hemoglobin 13.9; Platelets 184.0; Potassium 3.9; Sodium 140; TSH 4.31    Lipid Panel Lab Results  Component Value Date   CHOL 129 12/25/2018   HDL 44.20 12/25/2018   LDLCALC 62 12/25/2018   TRIG 113.0 12/25/2018      Wt Readings from Last 3 Encounters:  01/21/19 231 lb (104.8 kg)  07/17/18 229 lb 8 oz (104.1 kg)  06/13/18 227 lb 12.8 oz (103.3 kg)       ASSESSMENT AND PLAN:  Permanent atrial fibrillation (HCC) Asymptomatic, in the past would Stop and start her various medications if she does not feel well, intolerances Prior side effects on flecainide Stressed importance of compliance with her Xarelto Continue metoprolol succinate 100 mg daily Medical management  Coronary artery disease of native artery of native heart with stable angina pectoris ([place]) Stable with no anginal symptoms No further testing No symptoms  Essential hypertension Blood pressure is well controlled at home.  No changes made to the medications.Elevated today, racing into office At home 379 systolic regular basis  Hyperlipidemia with target LDL less than 70 Cholesterol at goal, no  changes made stable  Type 2 diabetes mellitus with other circulatory complication, without long-term current use of insulin (HCC) Recommended walking program, low carbohydrate diet Managed by primary care  Morbid obesity ([place]) We have encouraged continued exercise, careful diet management in an effort to lose weight.    Total encounter time more than 25 minutes  Greater than 50% was spent in counseling and coordination of care with the patient   Disposition:   F/U  12 months   No orders of the defined types were placed in this encounter.    Signed, Esmond Plants, M.D., Ph.D. 01/21/2019  [place], [place]

## 2019-01-21 ENCOUNTER — Ambulatory Visit (INDEPENDENT_AMBULATORY_CARE_PROVIDER_SITE_OTHER): Payer: PPO | Admitting: Cardiovascular Disease

## 2019-01-21 ENCOUNTER — Other Ambulatory Visit: Payer: Self-pay

## 2019-01-21 ENCOUNTER — Encounter: Payer: Self-pay | Admitting: Cardiovascular Disease

## 2019-01-21 VITALS — BP 158/80 | HR 92 | Ht 64.0 in | Wt 231.0 lb

## 2019-01-21 DIAGNOSIS — I10 Essential (primary) hypertension: Secondary | ICD-10-CM | POA: Diagnosis not present

## 2019-01-21 DIAGNOSIS — E119 Type 2 diabetes mellitus without complications: Secondary | ICD-10-CM | POA: Diagnosis not present

## 2019-01-21 DIAGNOSIS — E785 Hyperlipidemia, unspecified: Secondary | ICD-10-CM | POA: Diagnosis not present

## 2019-01-21 DIAGNOSIS — I4819 Other persistent atrial fibrillation: Secondary | ICD-10-CM | POA: Diagnosis not present

## 2019-01-21 DIAGNOSIS — I25118 Atherosclerotic heart disease of native coronary artery with other forms of angina pectoris: Secondary | ICD-10-CM | POA: Diagnosis not present

## 2019-01-21 MED ORDER — METOPROLOL SUCCINATE ER 100 MG PO TB24: 100.0000 mg | ORAL_TABLET | ORAL | 3 refills | Status: DC

## 2019-01-21 NOTE — Patient Instructions (Signed)
Medication Instructions:  No changes  Call if you need lasix/furosemide for ABD bloating , fluid retention, shortness of breath  If you need a refill on your cardiac medications before your next appointment, please call your pharmacy.    Lab work: No new labs needed   If you have labs (blood work) drawn today and your tests are completely normal, you will receive your results only by: Marland Kitchen MyChart Message (if you have MyChart) OR . A paper copy in the mail If you have any lab test that is abnormal or we need to change your treatment, we will call you to review the results.   Testing/Procedures: No new testing needed   Follow-Up: At [facility], you and your health needs are our priority.  As part of our continuing mission to provide you with exceptional heart care, we have created designated Provider Care Teams.  These Care Teams include your primary Cardiologist (physician) and Advanced Practice Providers (APPs -  Physician Assistants and Nurse Practitioners) who all work together to provide you with the care you need, when you need it.  . You will need a follow up appointment in 12 months .   Please call our office 2 months in advance to schedule this appointment.    . Providers on your designated Care Team:   . Murray Hodgkins, NP . Christell Faith, PA-C . Marrianne Mood, PA-C  Any Other Special Instructions Will Be Listed Below (If Applicable).  For educational health videos Log in to : [email or web address] Or : [email or web address], password : [email or web address]

## 2019-02-13 ENCOUNTER — Other Ambulatory Visit: Payer: Self-pay

## 2019-02-13 ENCOUNTER — Ambulatory Visit (INDEPENDENT_AMBULATORY_CARE_PROVIDER_SITE_OTHER): Payer: PPO

## 2019-02-13 DIAGNOSIS — Z23 Encounter for immunization: Secondary | ICD-10-CM

## 2019-02-28 DIAGNOSIS — M9901 Segmental and somatic dysfunction of cervical region: Secondary | ICD-10-CM | POA: Diagnosis not present

## 2019-02-28 DIAGNOSIS — M9903 Segmental and somatic dysfunction of lumbar region: Secondary | ICD-10-CM | POA: Diagnosis not present

## 2019-02-28 DIAGNOSIS — M5412 Radiculopathy, cervical region: Secondary | ICD-10-CM | POA: Diagnosis not present

## 2019-02-28 DIAGNOSIS — M5136 Other intervertebral disc degeneration, lumbar region: Secondary | ICD-10-CM | POA: Diagnosis not present

## 2019-04-11 ENCOUNTER — Telehealth: Payer: Self-pay

## 2019-04-11 DIAGNOSIS — M9901 Segmental and somatic dysfunction of cervical region: Secondary | ICD-10-CM | POA: Diagnosis not present

## 2019-04-11 DIAGNOSIS — M5412 Radiculopathy, cervical region: Secondary | ICD-10-CM | POA: Diagnosis not present

## 2019-04-11 DIAGNOSIS — M5136 Other intervertebral disc degeneration, lumbar region: Secondary | ICD-10-CM | POA: Diagnosis not present

## 2019-04-11 DIAGNOSIS — M9903 Segmental and somatic dysfunction of lumbar region: Secondary | ICD-10-CM | POA: Diagnosis not present

## 2019-04-11 NOTE — Telephone Encounter (Signed)
Copied from [place] [id number]. Topic: General - Other >> Apr 11, 2019  8:35 AM Keene Breath wrote: Reason for CRM: Patient called to make sure that her upcoming appt. Is in office and wanted to know if her A1C will be checked.  She thought she was supposed to have a lab appt. Before this appt.  Please call to explain at [phone]

## 2019-04-12 ENCOUNTER — Other Ambulatory Visit: Payer: Self-pay

## 2019-04-16 ENCOUNTER — Encounter: Payer: Self-pay | Admitting: Internal Medicine

## 2019-04-16 ENCOUNTER — Ambulatory Visit (INDEPENDENT_AMBULATORY_CARE_PROVIDER_SITE_OTHER): Payer: PPO | Admitting: Internal Medicine

## 2019-04-16 ENCOUNTER — Other Ambulatory Visit: Payer: Self-pay

## 2019-04-16 VITALS — BP 142/76 | HR 85 | Temp 97.3°F | Ht 64.0 in | Wt 229.0 lb

## 2019-04-16 DIAGNOSIS — E119 Type 2 diabetes mellitus without complications: Secondary | ICD-10-CM

## 2019-04-16 DIAGNOSIS — L719 Rosacea, unspecified: Secondary | ICD-10-CM

## 2019-04-16 DIAGNOSIS — I4819 Other persistent atrial fibrillation: Secondary | ICD-10-CM

## 2019-04-16 DIAGNOSIS — K760 Fatty (change of) liver, not elsewhere classified: Secondary | ICD-10-CM | POA: Diagnosis not present

## 2019-04-16 DIAGNOSIS — H401190 Primary open-angle glaucoma, unspecified eye, stage unspecified: Secondary | ICD-10-CM

## 2019-04-16 DIAGNOSIS — I10 Essential (primary) hypertension: Secondary | ICD-10-CM

## 2019-04-16 DIAGNOSIS — E1165 Type 2 diabetes mellitus with hyperglycemia: Secondary | ICD-10-CM

## 2019-04-16 DIAGNOSIS — E559 Vitamin D deficiency, unspecified: Secondary | ICD-10-CM | POA: Insufficient documentation

## 2019-04-16 DIAGNOSIS — R140 Abdominal distension (gaseous): Secondary | ICD-10-CM

## 2019-04-16 LAB — HEMOGLOBIN A1C: Hgb A1c MFr Bld: 7.8 % — ABNORMAL HIGH (ref 4.6–6.5)

## 2019-04-16 LAB — COMPREHENSIVE METABOLIC PANEL
ALT: 37 U/L — ABNORMAL HIGH (ref 0–35)
AST: 35 U/L (ref 0–37)
Albumin: 4.6 g/dL (ref 3.5–5.2)
Alkaline Phosphatase: 52 U/L (ref 39–117)
BUN: 21 mg/dL (ref 6–23)
CO2: 27 mEq/L (ref 19–32)
Calcium: 9.9 mg/dL (ref 8.4–10.5)
Chloride: 101 mEq/L (ref 96–112)
Creatinine, Ser: 0.74 mg/dL (ref 0.40–1.20)
GFR: 76.24 mL/min (ref >60.00)
Glucose, Bld: 148 mg/dL — ABNORMAL HIGH (ref 70–99)
Potassium: 4.1 mEq/L (ref 3.5–5.1)
Sodium: 139 mEq/L (ref 135–145)
Total Bilirubin: 0.8 mg/dL (ref 0.2–1.2)
Total Protein: 7.3 g/dL (ref 6.0–8.3)

## 2019-04-16 LAB — VITAMIN D 25 HYDROXY (VIT D DEFICIENCY, FRACTURES): VITD: 58.94 ng/mL (ref 30.00–100.00)

## 2019-04-16 LAB — LIPID PANEL
Cholesterol: 115 mg/dL (ref 0–200)
HDL: 41.2 mg/dL (ref >39.00)
LDL Cholesterol: 56 mg/dL (ref 0–99)
NonHDL: 74.08
Total CHOL/HDL Ratio: 3
Triglycerides: 88 mg/dL (ref 0.0–149.0)
VLDL: 17.6 mg/dL (ref 0.0–40.0)

## 2019-04-16 MED ORDER — GLUCOSE BLOOD VI STRP: 1.0000 | ORAL_STRIP | Freq: Every day | 3 refills | Status: DC

## 2019-04-16 MED ORDER — METRONIDAZOLE 1 % EX GEL: Freq: Every day | CUTANEOUS | 11 refills | Status: AC | PRN

## 2019-04-16 MED ORDER — AMLODIPINE BESYLATE 2.5 MG PO TABS: 2.5000 mg | ORAL_TABLET | Freq: Every day | ORAL | 3 refills | Status: DC

## 2019-04-16 NOTE — Progress Notes (Signed)
Chief Complaint  Patient presents with  . Follow-up   F/u  1. DM 2 last A1C 7.7 on januvia 25 mg and metformin 500 mg bid needs refill of strips  2. HTN toprol 100 mg qd xl hctz 25 mg qd BP elevated has had string cheese recently and omlet with ham 3. afib on toprol 100 mg qd xl ib xarelto  4. Left eye glaucoma referred [facility] but not in network sees Dr. Wallace Going who referred out  5. Fatty liver c/o abdomen bloating but no pain  6. Vit D def on 5K IU qd x 1-2 months   Review of Systems  Constitutional: Negative for weight loss.  HENT: Negative for hearing loss.   Eyes: Negative for blurred vision.  Respiratory: Negative for shortness of breath.   Cardiovascular: Negative for chest pain.  Gastrointestinal: Negative for abdominal pain.       +ab bloating   Genitourinary:       Denies vaginal bleeding    Musculoskeletal: Negative for falls.  Skin: Negative for rash.  Neurological: Negative for headaches.  Psychiatric/Behavioral: Negative for depression.   Past Medical History:  Diagnosis Date  . Chicken pox   . Coronary artery disease    a. 2004 s/p DES to mid-LAD; b. 10/2005 Cath: LM nl, LAD patent stent, 46m D1 nl, LCX nl, OM1/2 nl, RCA nl, EF 60%; c. 05/2015 MV: EF 83%, no ischemia. Low risk.  . Diastolic dysfunction    a. 09/2014 Echo: Nl EF. Gr1 DD. Very mild AS. Mild MR. Nl RV fxn. PASP 370mg.  . DM (diabetes mellitus), type 2 ([facility]  . Glaucoma   . Heart murmur   . Hyperlipidemia   . Hypertension   . PAF (paroxysmal atrial fibrillation) ([facility]   a. 10/2014 Holter: PAF b. quit taking Eliquis in 01/2015 due to myalgias; c. CHA2DS2VASc = 6-->Xarelto.   Past Surgical History:  Procedure Laterality Date  . CARDIAC CATHETERIZATION     [place] x1 stent  . coronary artery stent  2004  . TONSILLECTOMY AND ADENOIDECTOMY  1949   Family History  Problem Relation Age of Onset  . Hyperlipidemia Mother   . Hypertension Mother   . Hyperlipidemia Father   . Hypertension  Father   . Heart disease Father [id number]. Heart attack Father    Social History   Socioeconomic History  . Marital status: Divorced    Spouse name: Not on file  . Number of children: Not on file  . Years of education: Not on file  . Highest education level: Not on file  Occupational History  . Not on file  Social Needs  . Financial resource strain: Not on file  . Food insecurity    Worry: Not on file    Inability: Not on file  . Transportation needs    Medical: Not on file    Non-medical: Not on file  Tobacco Use  . Smoking status: Former SmResearch scientist (life sciences). Smokeless tobacco: Never Used  . Tobacco comment: quit 25 years ago  Substance and Sexual Activity  . Alcohol use: Yes    Alcohol/week: 0.0 standard drinks    Comment: very rarely  . Drug use: No  . Sexual activity: Not on file  Lifestyle  . Physical activity    Days per week: Not on file    Minutes per session: Not on file  . Stress: Not on file  Relationships  . Social coHerbalistn phone: Not  on file    Gets together: Not on file    Attends religious service: Not on file    Active member of club or organization: Not on file    Attends meetings of clubs or organizations: Not on file    Relationship status: Not on file  . Intimate partner violence    Fear of current or ex partner: Not on file    Emotionally abused: Not on file    Physically abused: Not on file    Forced sexual activity: Not on file  Other Topics Concern  . Not on file  Social History Narrative   Lives in [place]. From [place] and moved here for her daughter, granddaughter. in home.      Work - retired.   Diet - regular   Exercise - [employer]   Current Meds  Medication Sig  . atorvastatin (LIPITOR) 20 MG tablet Take 1 tablet (20 mg total) by mouth at bedtime.  Marland Kitchen glucose blood (ONE TOUCH ULTRA TEST) test strip 1 each by Other route daily. Use as instructed  E11.9  . hydrochlorothiazide (HYDRODIURIL) 25 MG tablet Take 1 tablet (25 mg  total) by mouth daily.  Marland Kitchen latanoprost (XALATAN) 0.005 % ophthalmic solution INSTILL 1 DROP INTO EACH EYE AT BEDTIME  . metFORMIN (GLUCOPHAGE) 500 MG tablet Take 2 tablets (1,000 mg total) by mouth 2 (two) times daily with a meal. ER formulation  . metoprolol succinate (TOPROL-XL) 100 MG 24 hr tablet Take 1 tablet (100 mg total) by mouth as directed. 1 tablet Daily in the AM and 1/2 tablet in the PM as needed fast heart rate/palpitations  . metroNIDAZOLE (METROGEL) 1 % gel Apply topically daily as needed.  Glory Rosebush Delica Lancets 99I MISC Use up to 3 times daily to check blood sugar.  . rivaroxaban (XARELTO) 20 MG TABS tablet Take 1 tablet (20 mg total) by mouth daily with supper.  . sitaGLIPtin (JANUVIA) 25 MG tablet Take 1 tablet (25 mg total) by mouth daily.  . [DISCONTINUED] glucose blood (ONE TOUCH ULTRA TEST) test strip 1 each by Other route daily. Use as instructed  E11.9  . [DISCONTINUED] metroNIDAZOLE (METROGEL) 1 % gel Apply topically daily as needed.   Allergies  Allergen Reactions  . Niaspan [Niacin Er] Rash  . Januvia [Sitagliptin]     Joint pain, h/a, URI  . Lisinopril Cough   No results found for this or any previous visit (from the past 2160 hour(s)). Objective  Body mass index is 39.31 kg/m. Wt Readings from Last 3 Encounters:  04/16/19 229 lb (103.9 kg)  01/21/19 231 lb (104.8 kg)  07/17/18 229 lb 8 oz (104.1 kg)   Temp Readings from Last 3 Encounters:  04/16/19 (!) 97.3 F (36.3 C) (Skin)  06/13/18 98.3 F (36.8 C) (Oral)  02/27/18 98.2 F (36.8 C) (Oral)   BP Readings from Last 3 Encounters:  04/16/19 (!) 142/76  01/21/19 (!) 158/80  12/25/18 120/70   Pulse Readings from Last 3 Encounters:  04/16/19 85  01/21/19 92  12/25/18 65    Physical Exam Vitals signs and nursing note reviewed.  Constitutional:      Appearance: Normal appearance. She is well-developed and well-groomed. She is obese.     Comments: +mask o n   HENT:     Head: Normocephalic  and atraumatic.  Eyes:     Conjunctiva/sclera: Conjunctivae normal.     Pupils: Pupils are equal, round, and reactive to light.  Cardiovascular:     Rate  and Rhythm: Normal rate. Rhythm irregular.     Heart sounds: Normal heart sounds. No murmur.     Comments: In afib  Pulmonary:     Effort: Pulmonary effort is normal.     Breath sounds: Normal breath sounds.  Abdominal:     General: Abdomen is flat. Bowel sounds are normal. There is no distension.     Tenderness: There is no abdominal tenderness.  Skin:    General: Skin is warm and dry.          Comments: Spider veins to b/l legs   Neurological:     General: No focal deficit present.     Mental Status: She is alert and oriented to person, place, and time. Mental status is at baseline.     Gait: Gait normal.  Psychiatric:        Attention and Perception: Attention and perception normal.        Mood and Affect: Mood and affect normal.        Speech: Speech normal.        Behavior: Behavior normal. Behavior is cooperative.        Thought Content: Thought content normal.        Cognition and Memory: Cognition and memory normal.        Judgment: Judgment normal.     Assessment  Plan  Essential hypertension - Plan: amLODipine (NORVASC) 2.5 MG tablet if BP >130/>80 declined ARB in past and cant tolerate ACEI Cont other meds  Monitor BP  Reduce salt  Vitamin D deficiency - Plan: Vitamin D (25 hydroxy) reduce from 5000 IU to 2000-2500 IU daily making mouth dry per pt   Type 2 diabetes mellitus with hyperglycemia, without long-term current use of insulin (HCC) - Plan: Lipid panel, Comprehensive metabolic panel, HgB B9Q, amLODipine (NORVASC) 2.5 MG tablet Cont meds   Rosacea - Plan: metroNIDAZOLE (METROGEL) 1 % gel  Primary open angle glaucoma, unspecified glaucoma stage, unspecified laterality -wants to be referred to [facility] instead of [facility] by Dr. Wallace Going not in network   Fatty liver Given info Of not if ab bloating cont  rec cT ab/pelvis disc with pt today declines   Persistent atrial fibrillation ([place])  F/u Dr. Rockey Situ   HM Had flu shot utd prevnar hadand pna 23 Zoster had, declines shingrix Tdap utd  DEXA 04/07/16 neg, colonoscopy had, out of age age window pap, mammo declines mammo again 12/12/18 and 04/16/2019   rec calcium 600 mg bid, vit D 3 rec 2000-25000 IU qd  Declines [facility] 12/10/18 saw Dr. Mordecai Rasmussen glaucoma L eye wants referral to [facility] instead of [facility]  Provider: Dr. Olivia Mackie McLean-Scocuzza-[facility]

## 2019-04-16 NOTE — Telephone Encounter (Signed)
We can do labs at appt today   [place]

## 2019-04-16 NOTE — Patient Instructions (Addendum)
Cut vitamin D down to 2000 to 2500 IU daily total   Continue to check you blood pressure at home if >130/>80 take norvasc 2.5 mg daily   Think about mammogram    Fatty Liver Disease  Fatty liver disease occurs when too much fat has built up in your liver cells. Fatty liver disease is also called hepatic steatosis or steatohepatitis. The liver removes harmful substances from your bloodstream and produces fluids that your body needs. It also helps your body use and store energy from the food you eat. In many cases, fatty liver disease does not cause symptoms or problems. It is often diagnosed when tests are being done for other reasons. However, over time, fatty liver can cause inflammation that may lead to more serious liver problems, such as scarring of the liver (cirrhosis) and liver failure. Fatty liver is associated with insulin resistance, increased body fat, high blood pressure (hypertension), and high cholesterol. These are features of metabolic syndrome and increase your risk for stroke, diabetes, and heart disease. What are the causes? This condition may be caused by:  Drinking too much alcohol.  Poor nutrition.  Obesity.  Cushing's syndrome.  Diabetes.  High cholesterol.  Certain drugs.  Poisons.  Some viral infections.  Pregnancy. What increases the risk? You are more likely to develop this condition if you:  Abuse alcohol.  Are overweight.  Have diabetes.  Have hepatitis.  Have a high triglyceride level.  Are pregnant. What are the signs or symptoms? Fatty liver disease often does not cause symptoms. If symptoms do develop, they can include:  Fatigue.  Weakness.  Weight loss.  Confusion.  Abdominal pain.  Nausea and vomiting.  Yellowing of your skin and the white parts of your eyes (jaundice).  Itchy skin. How is this diagnosed? This condition may be diagnosed by:  A physical exam and medical history.  Blood tests.  Imaging tests,  such as an ultrasound, CT scan, or MRI.  A liver biopsy. A small sample of liver tissue is removed using a needle. The sample is then looked at under a microscope. How is this treated? Fatty liver disease is often caused by other health conditions. Treatment for fatty liver may involve medicines and lifestyle changes to manage conditions such as:  Alcoholism.  High cholesterol.  Diabetes.  Being overweight or obese. Follow these instructions at home:   Do not drink alcohol. If you have trouble quitting, ask your health care provider how to safely quit with the help of medicine or a supervised program. This is important to keep your condition from getting worse.  Eat a healthy diet as told by your health care provider. Ask your health care provider about working with a diet and nutrition specialist (dietitian) to develop an eating plan.  Exercise regularly. This can help you lose weight and control your cholesterol and diabetes. Talk to your health care provider about an exercise plan and which activities are best for you.  Take over-the-counter and prescription medicines only as told by your health care provider.  Keep all follow-up visits as told by your health care provider. This is important. Contact a health care provider if: You have trouble controlling your:  Blood sugar. This is especially important if you have diabetes.  Cholesterol.  Drinking of alcohol. Get help right away if:  You have abdominal pain.  You have jaundice.  You have nausea and vomiting.  You vomit blood or material that looks like coffee grounds.  You have stools  that are black, tar-like, or bloody. Summary  Fatty liver disease develops when too much fat builds up in the cells of your liver.  Fatty liver disease often causes no symptoms or problems. However, over time, fatty liver can cause inflammation that may lead to more serious liver problems, such as scarring of the liver  (cirrhosis).  You are more likely to develop this condition if you abuse alcohol, are pregnant, are overweight, have diabetes, have hepatitis, or have high triglyceride levels.  Contact your health care provider if you have trouble controlling your weight, blood sugar, cholesterol, or drinking of alcohol. This information is not intended to replace advice given to you by your health care provider. Make sure you discuss any questions you have with your health care provider. Document Released: 07/01/2005 Document Revised: 04/28/2017 Document Reviewed: 02/22/2017 Elsevier Patient Education  2020 Elsevier Inc.    Varicose Veins Varicose veins are veins that have become enlarged, bulged, and twisted. They most often appear in the legs. What are the causes? This condition is caused by damage to the valves in the vein. These valves help blood return to your heart. When they are damaged and they stop working properly, blood may flow backward and back up in the veins near the skin, causing the veins to get larger and appear twisted. The condition can result from any issue that causes blood to back up, like pregnancy, prolonged standing, or obesity. What increases the risk? This condition is more likely to develop in people who are:  On their feet a lot.  Pregnant.  Overweight. What are the signs or symptoms? Symptoms of this condition include:  Bulging, twisted, and bluish veins.  A feeling of heaviness. This may be worse at the end of the day.  Leg pain. This may be worse at the end of the day.  Swelling in the leg.  Changes in skin color over the veins. How is this diagnosed? This condition may be diagnosed based on your symptoms, a physical exam, and an ultrasound test. How is this treated? Treatment for this condition may involve:  Avoiding sitting or standing in one position for long periods of time.  Wearing compression stockings. These stockings help to prevent blood clots  and reduce swelling in the legs.  Raising (elevating) the legs when resting.  Losing weight.  Exercising regularly. If you have persistent symptoms or want to improve the way your varicose veins look, you may choose to have a procedure to close the varicose veins off or to remove them. Treatments to close off the veins include:  Sclerotherapy. In this treatment, a solution is injected into a vein to close it off.  Laser treatment. In this treatment, the vein is heated with a laser to close it off.  Radiofrequency vein ablation. In this treatment, an electrical current produced by radio waves is used to close off the vein. Treatments to remove the veins include:  Phlebectomy. In this treatment, the veins are removed through small incisions made over the veins.  Vein ligation and stripping. In this treatment, incisions are made over the veins. The veins are then removed after being tied (ligated) with stitches (sutures). Follow these instructions at home: Activity  Walk as much as possible. Walking increases blood flow. This helps blood return to the heart and takes pressure off your veins. It also increases your cardiovascular strength.  Follow your health care provider's instructions about exercising.  Do not stand or sit in one position for a long  period of time.  Do not sit with your legs crossed.  Rest with your legs raised during the day. General instructions   Follow any diet instructions given to you by your health care provider.  Wear compression stockings as directed by your health care provider. Do not wear other kinds of tight clothing around your legs, pelvis, or waist.  Elevate your legs at night to above the level of your heart.  If you get a cut in the skin over the varicose vein and the vein bleeds: ? Lie down with your leg raised. ? Apply firm pressure to the cut with a clean cloth until the bleeding stops. ? Place a bandage (dressing) on the cut. Contact  a health care provider if:  The skin around your varicose veins starts to break down.  You have pain, redness, tenderness, or hard swelling over a vein.  You are uncomfortable because of pain.  You get a cut in the skin over a varicose vein and it will not stop bleeding. Summary  Varicose veins are veins that have become enlarged, bulged, and twisted. They most often appear in the legs.  This condition is caused by damage to the valves in the vein. These valves help blood return to your heart.  Treatment for this condition includes frequent movements, wearing compression stockings, losing weight, and exercising regularly. In some cases, procedures are done to close off or remove the veins.  Treatment for this condition may include wearing compression stockings, elevating the legs, losing weight, and engaging in regular activity. In some cases, procedures are done to close off or remove the veins. This information is not intended to replace advice given to you by your health care provider. Make sure you discuss any questions you have with your health care provider. Document Released: 02/23/2005 Document Revised: 07/12/2018 Document Reviewed: 06/08/2016 Elsevier Patient Education  2020 Elsevier Inc.   Nonalcoholic Fatty Liver Disease Diet, Adult Nonalcoholic fatty liver disease is a condition that causes fat to build up in and around the liver. The disease makes it harder for the liver to work the way that it should. Following a healthy diet can help to keep nonalcoholic fatty liver disease under control. It can also help to prevent or improve conditions that are associated with the disease, such as heart disease, diabetes, high blood pressure, and abnormal cholesterol levels. Along with regular exercise, this diet:  Promotes weight loss.  Helps to control blood sugar levels.  Helps to improve the way that the body uses insulin. What are tips for following this plan? Reading food  labels Always check food labels for:  The amount of saturated fat in a food. You should limit your intake of saturated fat. Saturated fat is found in foods that come from animals, including meat and dairy products such as butter, cheese, and whole milk.  The amount of fiber in a food. You should choose high-fiber foods such as fruits, vegetables, and whole grains. Try to get 25-30 grams (g) of fiber a day.  Cooking  When cooking, use heart-healthy oils that are high in monounsaturated fats. These include olive oil, canola oil, and avocado oil.  Limit frying or deep-frying foods. Cook foods using healthy methods such as baking, boiling, steaming, and grilling instead. Meal planning  You may want to keep track of how many calories you take in. Eating the right amount of calories will help you achieve a healthy weight. Meeting with a registered dietitian can help you get started.  Limit how often you eat takeout and fast food. These foods are usually very high in fat, salt, and sugar.  Use the glycemic index (GI) to plan your meals. The index tells you how quickly a food will raise your blood sugar. Choose low-GI foods (GI less than 55). These foods take a longer time to raise blood sugar. A registered dietitian can help you identify foods lower on the GI scale. Lifestyle  You may want to follow a Mediterranean diet. This diet includes a lot of vegetables, lean meats or fish, whole grains, fruits, and healthy oils and fats. What foods can I eat?  Fruits Bananas. Apples. Oranges. Grapes. Papaya. Mango. Pomegranate. Kiwi. Grapefruit. Cherries. Vegetables Lettuce. Spinach. Peas. Beets. Cauliflower. Cabbage. Broccoli. Carrots. Tomatoes. Squash. Eggplant. Herbs. Peppers. Onions. Cucumbers. Brussels sprouts. Yams and sweet potatoes. Beans. Lentils. Grains Whole wheat or whole-grain foods, including breads, crackers, cereals, and pasta. Stone-ground whole wheat. Unsweetened oatmeal. Bulgur.  Barley. Quinoa. Brown or wild rice. Corn or whole wheat flour tortillas. Meats and other proteins Lean meats. Poultry. Tofu. Seafood and shellfish. Dairy Low-fat or fat-free dairy products, such as yogurt, cottage cheese, or cheese. Beverages Water. Sugar-free drinks. Tea. Coffee. Low-fat or skim milk. Milk alternatives, such as soy or almond milk. Real fruit juice. Fats and oils Avocado. Canola or olive oil. Nuts and nut butters. Seeds. Seasonings and condiments Mustard. Relish. Low-fat, low-sugar ketchup and barbecue sauce. Low-fat or fat-free mayonnaise. Sweets and desserts Sugar-free sweets. The items listed above may not be a complete list of foods and beverages you can eat. Contact a dietitian for more information. What foods should I limit or avoid? Meats and other proteins Limit red meat to 1-2 times a week. Dairy MicrosoftFull-fat dairy. Fats and oils Palm oil and coconut oil. Fried foods. Other foods Processed foods. Foods that contain a lot of salt or sodium. Sweets and desserts Sweets that contain sugar. Beverages Sweetened drinks, such as sweet tea, milkshakes, iced sweet drinks, and sodas. Alcohol. The items listed above may not be a complete list of foods and beverages you should avoid. Contact a dietitian for more information. Where to find more information The [employer] of Diabetes and Digestive and Kidney Diseases: [employer].siniddk.nih.gov Summary  Nonalcoholic fatty liver disease is a condition that causes fat to build up in and around the liver.  Following a healthy diet can help to keep nonalcoholic fatty liver disease under control. Your diet should be rich in fruits, vegetables, whole grains, and lean proteins.  Limit your intake of saturated fat. Saturated fat is found in foods that come from animals, including meat and dairy products such as butter, cheese, and whole milk.  This diet promotes weight loss, helps to control blood sugar levels, and helps to improve  the way that the body uses insulin. This information is not intended to replace advice given to you by your health care provider. Make sure you discuss any questions you have with your health care provider. Document Released: 09/30/2014 Document Revised: 09/07/2018 Document Reviewed: 06/07/2018 Elsevier Patient Education  2020 [employer]

## 2019-04-18 ENCOUNTER — Telehealth: Payer: Self-pay | Admitting: Internal Medicine

## 2019-04-18 ENCOUNTER — Other Ambulatory Visit: Payer: Self-pay | Admitting: Internal Medicine

## 2019-04-18 DIAGNOSIS — E1165 Type 2 diabetes mellitus with hyperglycemia: Secondary | ICD-10-CM

## 2019-04-18 MED ORDER — SITAGLIPTIN PHOSPHATE 50 MG PO TABS: 50.0000 mg | ORAL_TABLET | Freq: Every day | ORAL | 3 refills | Status: DC

## 2019-04-18 NOTE — Telephone Encounter (Signed)
Mail Rx compression stockings  Advise pt we can increase januvia to 50 mg daily   [place]

## 2019-05-11 ENCOUNTER — Other Ambulatory Visit: Payer: Self-pay | Admitting: Cardiovascular Disease

## 2019-05-13 NOTE — Telephone Encounter (Signed)
Pt's age 78, wt 103.9 kg, SCr 0.74, CrCl 79.64, last ov w/ TG 01/21/19.

## 2019-05-13 NOTE — Telephone Encounter (Signed)
Refill Request.  

## 2019-05-16 ENCOUNTER — Other Ambulatory Visit: Payer: Self-pay | Admitting: Cardiovascular Disease

## 2019-05-16 NOTE — Telephone Encounter (Signed)
Refill request for Xarelto 

## 2019-05-16 NOTE — Telephone Encounter (Signed)
89f 103.9kg Scr0.74 04/16/19 ccr 106.1 Lovw/gollan 01/21/19

## 2019-05-16 NOTE — Telephone Encounter (Signed)
Refill Request.  

## 2019-05-16 NOTE — Telephone Encounter (Signed)
Patient says refill did not go through please resend    *STAT* If patient is at the pharmacy, call can be transferred to refill team.   1. Which medications need to be refilled? (please list name of each medication and dose if known)  xarelto 20 mg po q d  2. Which pharmacy/location (including street and city if local pharmacy) is medication to be sent to? [facility] [address] [place]    3. Do they need a 30 day or 90 day supply? [place]

## 2019-06-27 DIAGNOSIS — H401424 Capsular glaucoma with pseudoexfoliation of lens, left eye, indeterminate stage: Secondary | ICD-10-CM | POA: Diagnosis not present

## 2019-06-27 DIAGNOSIS — H40001 Preglaucoma, unspecified, right eye: Secondary | ICD-10-CM | POA: Diagnosis not present

## 2019-07-17 DIAGNOSIS — M5136 Other intervertebral disc degeneration, lumbar region: Secondary | ICD-10-CM | POA: Diagnosis not present

## 2019-07-17 DIAGNOSIS — M9903 Segmental and somatic dysfunction of lumbar region: Secondary | ICD-10-CM | POA: Diagnosis not present

## 2019-07-17 DIAGNOSIS — M9901 Segmental and somatic dysfunction of cervical region: Secondary | ICD-10-CM | POA: Diagnosis not present

## 2019-07-17 DIAGNOSIS — M5412 Radiculopathy, cervical region: Secondary | ICD-10-CM | POA: Diagnosis not present

## 2019-08-06 ENCOUNTER — Other Ambulatory Visit: Payer: Self-pay | Admitting: Internal Medicine

## 2019-08-06 DIAGNOSIS — E1165 Type 2 diabetes mellitus with hyperglycemia: Secondary | ICD-10-CM

## 2019-08-06 MED ORDER — METFORMIN HCL ER 500 MG PO TB24: 1000.0000 mg | ORAL_TABLET | Freq: Two times a day (BID) | ORAL | 3 refills | Status: DC

## 2019-08-12 DIAGNOSIS — H40001 Preglaucoma, unspecified, right eye: Secondary | ICD-10-CM | POA: Diagnosis not present

## 2019-08-12 DIAGNOSIS — H401424 Capsular glaucoma with pseudoexfoliation of lens, left eye, indeterminate stage: Secondary | ICD-10-CM | POA: Diagnosis not present

## 2019-08-15 ENCOUNTER — Encounter: Payer: Self-pay | Admitting: Internal Medicine

## 2019-08-15 ENCOUNTER — Telehealth (INDEPENDENT_AMBULATORY_CARE_PROVIDER_SITE_OTHER): Payer: PPO | Admitting: Internal Medicine

## 2019-08-15 VITALS — BP 119/75 | HR 86 | Ht 64.0 in | Wt 229.0 lb

## 2019-08-15 DIAGNOSIS — M5432 Sciatica, left side: Secondary | ICD-10-CM | POA: Diagnosis not present

## 2019-08-15 DIAGNOSIS — E119 Type 2 diabetes mellitus without complications: Secondary | ICD-10-CM | POA: Diagnosis not present

## 2019-08-15 DIAGNOSIS — H401190 Primary open-angle glaucoma, unspecified eye, stage unspecified: Secondary | ICD-10-CM | POA: Diagnosis not present

## 2019-08-15 DIAGNOSIS — I10 Essential (primary) hypertension: Secondary | ICD-10-CM

## 2019-08-15 NOTE — Patient Instructions (Addendum)
COVID-19 Vaccine Information can be found at: [email or web address] For questions related to vaccine distribution or appointments, please email [email or web address] or call [phone].   They have at [facility] as well if you want the covid vaccine  COVID 19  [phone] in [place]   [phone] in [place]  [phone] in [place]   Semaglutide injection solution/ozempic  What is this medicine? SEMAGLUTIDE (Sem a GLOO tide) is used to improve blood sugar control in adults with type 2 diabetes. This medicine may be used with other diabetes medicines. This drug may also reduce the risk of heart attack or stroke if you have type 2 diabetes and risk factors for heart disease. This medicine may be used for other purposes; ask your health care provider or pharmacist if you have questions. COMMON BRAND NAME(S): OZEMPIC What should I tell my health care provider before I take this medicine? They need to know if you have any of these conditions:  endocrine tumors (MEN 2) or if someone in your family had these tumors  eye disease, vision problems  history of pancreatitis  kidney disease  stomach problems  thyroid cancer or if someone in your family had thyroid cancer  an unusual or allergic reaction to semaglutide, other medicines, foods, dyes, or preservatives  pregnant or trying to get pregnant  breast-feeding How should I use this medicine? This medicine is for injection under the skin of your upper leg (thigh), stomach area, or upper arm. It is given once every week (every 7 days). You will be taught how to prepare and give this medicine. Use exactly as directed. Take your medicine at regular intervals. Do not take it more often than directed. If you use this medicine with insulin, you should inject this medicine and the insulin separately. Do not mix them together. Do not give the injections right next to each  other. Change (rotate) injection sites with each injection. It is important that you put your used needles and syringes in a special sharps container. Do not put them in a trash can. If you do not have a sharps container, call your pharmacist or healthcare provider to get one. A special MedGuide will be given to you by the pharmacist with each prescription and refill. Be sure to read this information carefully each time. This drug comes with INSTRUCTIONS FOR USE. Ask your pharmacist for directions on how to use this drug. Read the information carefully. Talk to your pharmacist or health care provider if you have questions. Talk to your pediatrician regarding the use of this medicine in children. Special care may be needed. Overdosage: If you think you have taken too much of this medicine contact a poison control center or emergency room at once. NOTE: This medicine is only for you. Do not share this medicine with others. What if I miss a dose? If you miss a dose, take it as soon as you can within 5 days after the missed dose. Then take your next dose at your regular weekly time. If it has been longer than 5 days after the missed dose, do not take the missed dose. Take the next dose at your regular time. Do not take double or extra doses. If you have questions about a missed dose, contact your health care provider for advice. What may interact with this medicine?  other medicines for diabetes Many medications may cause changes in blood sugar, these include:  alcohol containing beverages  antiviral medicines for HIV or AIDS  aspirin and  aspirin-like drugs  certain medicines for blood pressure, heart disease, irregular heart beat  chromium  diuretics  female hormones, such as estrogens or progestins, birth control pills  fenofibrate  gemfibrozil  isoniazid  lanreotide  female hormones or anabolic steroids  MAOIs like Carbex, Eldepryl, Marplan, Nardil, and Parnate  medicines for  weight loss  medicines for allergies, asthma, cold, or cough  medicines for depression, anxiety, or psychotic disturbances  niacin  nicotine  NSAIDs, medicines for pain and inflammation, like ibuprofen or naproxen  octreotide  pasireotide  pentamidine  phenytoin  probenecid  quinolone antibiotics such as ciprofloxacin, levofloxacin, ofloxacin  some herbal dietary supplements  steroid medicines such as prednisone or cortisone  sulfamethoxazole; trimethoprim  thyroid hormones Some medications can hide the warning symptoms of low blood sugar (hypoglycemia). You may need to monitor your blood sugar more closely if you are taking one of these medications. These include:  beta-blockers, often used for high blood pressure or heart problems (examples include atenolol, metoprolol, propranolol)  clonidine  guanethidine  reserpine This list may not describe all possible interactions. Give your health care provider a list of all the medicines, herbs, non-prescription drugs, or dietary supplements you use. Also tell them if you smoke, drink alcohol, or use illegal drugs. Some items may interact with your medicine. What should I watch for while using this medicine? Visit your doctor or health care professional for regular checks on your progress. Drink plenty of fluids while taking this medicine. Check with your doctor or health care professional if you get an attack of severe diarrhea, nausea, and vomiting. The loss of too much body fluid can make it dangerous for you to take this medicine. A test called the HbA1C (A1C) will be monitored. This is a simple blood test. It measures your blood sugar control over the last 2 to 3 months. You will receive this test every 3 to 6 months. Learn how to check your blood sugar. Learn the symptoms of low and high blood sugar and how to manage them. Always carry a quick-source of sugar with you in case you have symptoms of low blood sugar. Examples  include hard sugar candy or glucose tablets. Make sure others know that you can choke if you eat or drink when you develop serious symptoms of low blood sugar, such as seizures or unconsciousness. They must get medical help at once. Tell your doctor or health care professional if you have high blood sugar. You might need to change the dose of your medicine. If you are sick or exercising more than usual, you might need to change the dose of your medicine. Do not skip meals. Ask your doctor or health care professional if you should avoid alcohol. Many nonprescription cough and cold products contain sugar or alcohol. These can affect blood sugar. Pens should never be shared. Even if the needle is changed, sharing may result in passing of viruses like hepatitis or HIV. Wear a medical ID bracelet or chain, and carry a card that describes your disease and details of your medicine and dosage times. Do not become pregnant while taking this medicine. Women should inform their doctor if they wish to become pregnant or think they might be pregnant. There is a potential for serious side effects to an unborn child. Talk to your health care professional or pharmacist for more information. What side effects may I notice from receiving this medicine? Side effects that you should report to your doctor or health care professional as  soon as possible:  allergic reactions like skin rash, itching or hives, swelling of the face, lips, or tongue  breathing problems  changes in vision  diarrhea that continues or is severe  lump or swelling on the neck  severe nausea  signs and symptoms of infection like fever or chills; cough; sore throat; pain or trouble passing urine  signs and symptoms of low blood sugar such as feeling anxious, confusion, dizziness, increased hunger, unusually weak or tired, sweating, shakiness, cold, irritable, headache, blurred vision, fast heartbeat, loss of consciousness  signs and symptoms  of kidney injury like trouble passing urine or change in the amount of urine  trouble swallowing  unusual stomach upset or pain  vomiting Side effects that usually do not require medical attention (report to your doctor or health care professional if they continue or are bothersome):  constipation  diarrhea  nausea  pain, redness, or irritation at site where injected  stomach upset This list may not describe all possible side effects. Call your doctor for medical advice about side effects. You may report side effects to FDA at 1-800-FDA-1088. Where should I keep my medicine? Keep out of the reach of children. Store unopened pens in a refrigerator between 2 and 8 degrees C (36 and 46 degrees F). Do not freeze. Protect from light and heat. After you first use the pen, it can be stored for 56 days at room temperature between 15 and 30 degrees C (59 and 86 degrees F) or in a refrigerator. Throw away your used pen after 56 days or after the expiration date, whichever comes first. Do not store your pen with the needle attached. If the needle is left on, medicine may leak from the pen. NOTE: This sheet is a summary. It may not cover all possible information. If you have questions about this medicine, talk to your doctor, pharmacist, or health care provider.  2020 Elsevier/Gold Standard (2019-01-29 09:41:51)   Dulaglutide injection/trulicity  What is this medicine? DULAGLUTIDE (DOO la GLOO tide) is used to improve blood sugar control in adults with type 2 diabetes. This medicine may be used with other oral diabetes medicines. This drug may also reduce the risk of heart attack or stroke if you have type 2 diabetes and risk factors for heart disease. This medicine may be used for other purposes; ask your health care provider or pharmacist if you have questions. COMMON BRAND NAME(S): Trulicity What should I tell my health care provider before I take this medicine? They need to know if you have  any of these conditions:  endocrine tumors (MEN 2) or if someone in your family had these tumors  eye disease, vision problems  history of pancreatitis  kidney disease  liver disease  stomach problems  thyroid cancer or if someone in your family had thyroid cancer  an unusual or allergic reaction to dulaglutide, other medicines, foods, dyes, or preservatives  pregnant or trying to get pregnant  breast-feeding How should I use this medicine? This medicine is for injection under the skin of your upper leg (thigh), stomach area, or upper arm. It is usually given once every week (every 7 days). You will be taught how to prepare and give this medicine. Use exactly as directed. Take your medicine at regular intervals. Do not take it more often than directed. If you use this medicine with insulin, you should inject this medicine and the insulin separately. Do not mix them together. Do not give the injections right next to  each other. Change (rotate) injection sites with each injection. It is important that you put your used needles and syringes in a special sharps container. Do not put them in a trash can. If you do not have a sharps container, call your pharmacist or healthcare provider to get one. A special MedGuide will be given to you by the pharmacist with each prescription and refill. Be sure to read this information carefully each time. This drug comes with INSTRUCTIONS FOR USE. Ask your pharmacist for directions on how to use this drug. Read the information carefully. Talk to your pharmacist or health care provider if you have questions. Talk to your pediatrician regarding the use of this medicine in children. Special care may be needed. Overdosage: If you think you have taken too much of this medicine contact a poison control center or emergency room at once. NOTE: This medicine is only for you. Do not share this medicine with others. What if I miss a dose? If you miss a dose, take  it as soon as you can within 3 days after the missed dose. Then take your next dose at your regular weekly time. If it has been longer than 3 days after the missed dose, do not take the missed dose. Take the next dose at your regular time. Do not take double or extra doses. If you have questions about a missed dose, contact your health care provider for advice. What may interact with this medicine?  other medicines for diabetes Many medications may cause changes in blood sugar, these include:  alcohol containing beverages  antiviral medicines for HIV or AIDS  aspirin and aspirin-like drugs  certain medicines for blood pressure, heart disease, irregular heart beat  chromium  diuretics  female hormones, such as estrogens or progestins, birth control pills  fenofibrate  gemfibrozil  isoniazid  lanreotide  female hormones or anabolic steroids  MAOIs like Carbex, Eldepryl, Marplan, Nardil, and Parnate  medicines for weight loss  medicines for allergies, asthma, cold, or cough  medicines for depression, anxiety, or psychotic disturbances  niacin  nicotine  NSAIDs, medicines for pain and inflammation, like ibuprofen or naproxen  octreotide  pasireotide  pentamidine  phenytoin  probenecid  quinolone antibiotics such as ciprofloxacin, levofloxacin, ofloxacin  some herbal dietary supplements  steroid medicines such as prednisone or cortisone  sulfamethoxazole; trimethoprim  thyroid hormones Some medications can hide the warning symptoms of low blood sugar (hypoglycemia). You may need to monitor your blood sugar more closely if you are taking one of these medications. These include:  beta-blockers, often used for high blood pressure or heart problems (examples include atenolol, metoprolol, propranolol)  clonidine  guanethidine  reserpine This list may not describe all possible interactions. Give your health care provider a list of all the medicines, herbs,  non-prescription drugs, or dietary supplements you use. Also tell them if you smoke, drink alcohol, or use illegal drugs. Some items may interact with your medicine. What should I watch for while using this medicine? Visit your doctor or health care professional for regular checks on your progress. Drink plenty of fluids while taking this medicine. Check with your doctor or health care professional if you get an attack of severe diarrhea, nausea, and vomiting. The loss of too much body fluid can make it dangerous for you to take this medicine. A test called the HbA1C (A1C) will be monitored. This is a simple blood test. It measures your blood sugar control over the last 2 to 3 months.  You will receive this test every 3 to 6 months. Learn how to check your blood sugar. Learn the symptoms of low and high blood sugar and how to manage them. Always carry a quick-source of sugar with you in case you have symptoms of low blood sugar. Examples include hard sugar candy or glucose tablets. Make sure others know that you can choke if you eat or drink when you develop serious symptoms of low blood sugar, such as seizures or unconsciousness. They must get medical help at once. Tell your doctor or health care professional if you have high blood sugar. You might need to change the dose of your medicine. If you are sick or exercising more than usual, you might need to change the dose of your medicine. Do not skip meals. Ask your doctor or health care professional if you should avoid alcohol. Many nonprescription cough and cold products contain sugar or alcohol. These can affect blood sugar. Pens should never be shared. Even if the needle is changed, sharing may result in passing of viruses like hepatitis or HIV. Wear a medical ID bracelet or chain, and carry a card that describes your disease and details of your medicine and dosage times. What side effects may I notice from receiving this medicine? Side effects that  you should report to your doctor or health care professional as soon as possible:  allergic reactions like skin rash, itching or hives, swelling of the face, lips, or tongue  breathing problems  changes in vision  diarrhea that continues or is severe  lump or swelling on the neck  severe nausea  signs and symptoms of infection like fever or chills; cough; sore throat; pain or trouble passing urine  signs and symptoms of low blood sugar such as feeling anxious, confusion, dizziness, increased hunger, unusually weak or tired, sweating, shakiness, cold, irritable, headache, blurred vision, fast heartbeat, loss of consciousness  signs and symptoms of kidney injury like trouble passing urine or change in the amount of urine  trouble swallowing  unusual stomach upset or pain  vomiting Side effects that usually do not require medical attention (report to your doctor or health care professional if they continue or are bothersome):  diarrhea  loss of appetite  nausea  pain, redness, or irritation at site where injected  stomach upset This list may not describe all possible side effects. Call your doctor for medical advice about side effects. You may report side effects to FDA at 1-800-FDA-1088. Where should I keep my medicine? Keep out of the reach of children. Store unopened pens in a refrigerator between 2 and 8 degrees C (36 and 46 degrees F). Do not freeze or use if the medicine has been frozen. Protect from light and excessive heat. Store in the carton until use. Each single-dose pen can be kept at room temperature, not to exceed 30 degrees C (86 degrees F) for a total of 14 days, if needed. Throw away any unused medicine after the expiration date on the label. NOTE: This sheet is a summary. It may not cover all possible information. If you have questions about this medicine, talk to your doctor, pharmacist, or health care provider.  2020 Elsevier/Gold Standard (2019-01-29  09:34:53)

## 2019-08-15 NOTE — Progress Notes (Signed)
Virtual Visit via Video Note  I connected with Gabriela Reed  on 08/15/19 at  9:05 AM EDT by a video enabled telemedicine application and verified that I am speaking with the correct person using two identifiers.  Location patient: home Location provider:work or home office Persons participating in the virtual visit: patient, provider  I discussed the limitations of evaluation and management by telemedicine and the availability of in person appointments. The patient expressed understanding and agreed to proceed.   HPI: 1. Glaucoma saw Dr. Edilia Bo in [place] 08/12/19 and given new eye drops and pressure reduced from 26 to 23 and he states no surgery for now consider laser in the future and she will f/u Dr. Mordecai Rasmussen 09/2019 she reports her eye shed protein and this is what keeps the pressure high  2. DM 2 A1C 7.8 on metformin 500 XR and januvia 50 mg qd cbg this am 154 fasting she thinks she has dawn effect but has not been working out due to sciatica pain in left lower leg with going up steps of lifting up motion  3. HTN controlled on norvasc 2.5 mg qd, hctz 25 mg qd toprol 100 mg qd  sbp running 128   4. Sciatica pain with walking up stairs left leg 5/10 chiropractor helps declines pm&R f/u for now. This has limited her walking and lying down on left side hurts . Denies back pain tried Tyleno for relief   ROS: See pertinent positives and negatives per HPI. Denies CP, sob, GU blood   Past Medical History:  Diagnosis Date  . Chicken pox   . Coronary artery disease    a. 2004 s/p DES to mid-LAD; b. 10/2005 Cath: LM nl, LAD patent stent, 48m D1 nl, LCX nl, OM1/2 nl, RCA nl, EF 60%; c. 05/2015 MV: EF 83%, no ischemia. Low risk.  . Diastolic dysfunction    a. 09/2014 Echo: Nl EF. Gr1 DD. Very mild AS. Mild MR. Nl RV fxn. PASP 323mg.  . DM (diabetes mellitus), type 2 ([facility]  . Glaucoma   . Heart murmur   . Hyperlipidemia   . Hypertension   . [place]   a.  10/2014 Holter: PAF b. quit taking Eliquis in 01/2015 due to myalgias; c. CHA2DS2VASc = 6-->Xarelto.    Past Surgical History:  Procedure Laterality Date  . CARDIAC CATHETERIZATION     [place] x1 stent  . coronary artery stent  2004  . TONSILLECTOMY AND ADENOIDECTOMY  1949    Family History  Problem Relation Age of Onset  . Hyperlipidemia Mother   . Hypertension Mother   . Hyperlipidemia Father   . Hypertension Father   . Heart disease Father [id number]. Heart attack Father     SOCIAL HX:  [place], [place] in [place].  Work - retired. Diet - regular Exercise - [employer]   Current Outpatient Medications:  .  atorvastatin (LIPITOR) 20 MG tablet, Take 1 tablet (20 mg total) by mouth at bedtime., Disp: 90 tablet, Rfl: 3 .  dorzolamide (TRUSOPT) 2 % ophthalmic solution, Place 1 drop into the left eye 2 times daily., Disp: , Rfl:  .  glucose blood (ONE TOUCH ULTRA TEST) test strip, 1 each by Other route daily. Use as instructed  E11.9, Disp: 100 each, Rfl: 3 .  hydrochlorothiazide (HYDRODIURIL) 25 MG tablet, Take 1 tablet (25 mg total) by mouth daily., Disp: 90 tablet, Rfl: 3 .  latanoprost (XALATAN) 0.005 % ophthalmic solution, INSTILL 1 DROP INTO EACH EYE AT BEDTIME, Disp: , Rfl: 5 .  metFORMIN (GLUCOPHAGE-XR) 500 MG 24 hr tablet, Take 2 tablets (1,000 mg total) by mouth in the morning and at bedtime. With food. ER formulation, Disp: 360 tablet, Rfl: 3 .  metoprolol succinate (TOPROL-XL) 100 MG 24 hr tablet, Take 1 tablet (100 mg total) by mouth as directed. 1 tablet Daily in the AM and 1/2 tablet in the PM as needed fast heart rate/palpitations, Disp: 135 tablet, Rfl: 3 .  metroNIDAZOLE (METROGEL) 1 % gel, Apply topically daily as needed., Disp: 60 g, Rfl: 11 .  OneTouch Delica Lancets 16L MISC, Use up to 3 times daily to check blood sugar., Disp: 300 each, Rfl: 5 .  sitaGLIPtin (JANUVIA) 50 MG tablet, Take 1 tablet (50 mg  total) by mouth daily., Disp: 90 tablet, Rfl: 3 .  XARELTO 20 MG TABS tablet, TAKE 1 TABLET BY MOUTH ONCE DAILY WITH  SUPPER, Disp: 90 tablet, Rfl: 1 .  amLODipine (NORVASC) 2.5 MG tablet, Take 1 tablet (2.5 mg total) by mouth daily. For blood pressure >130/>80 (Patient not taking: Reported on 08/15/2019), Disp: 90 tablet, Rfl: 3  EXAM:  VITALS per patient if applicable:  GENERAL: alert, oriented, appears well and in no acute distress  HEENT: atraumatic, conjunttiva clear, no obvious abnormalities on inspection of external nose and ears  NECK: normal movements of the head and neck  LUNGS: on inspection no signs of respiratory distress, breathing rate appears normal, no obvious gross SOB, gasping or wheezing  CV: no obvious cyanosis  MS: moves all visible extremities without noticeable abnormality  PSYCH/NEURO: pleasant and cooperative, no obvious depression or anxiety, speech and thought processing grossly intact  ASSESSMENT AND PLAN:  Discussed the following assessment and plan:  Essential hypertension Cont meds  Monitor BP   Primary open angle glaucoma, unspecified glaucoma stage, unspecified laterality F/u Dr. Wallace Going 09/2019 cont eye drops  Dr. Edilia Bo saw 08/12/19   Type 2 diabetes mellitus without complication, without long-term current use of insulin (HCC) A1C 7.8  Cont meds  Consider ozempic or trulicity and stop januvia in future  Foot exam in future  Est AE will see 09/2019   Sciatica of left side Cont exercises given by chiropractor  Declines pm&R for now   HM Had flu shot utd prevnar hadand pna 25 Zoster had, declines shingrix Tdap utd covid vx waiting for now   DEXA 04/07/16 neg colonoscopy had 03/13/12 out of age age window pap mammo declines 7/15/20and 04/16/2019 and 08/15/19   rec calcium 600 mg bid, vit D 3 rec 2000-25000 IU qd  Declines [facility] 12/10/18 saw Dr. Braxton Feathers wants referral to [facility] [place] instead of [facility]  -we discussed  possible serious and likely etiologies, options for evaluation and workup, limitations of telemedicine visit vs in person visit, treatment, treatment risks and precautions. Pt prefers to treat via telemedicine empirically rather then risking or undertaking an in person visit at this moment. Patient agrees to seek prompt in person care if worsening, new symptoms arise, or if is not improving with treatment.   I discussed the assessment and treatment plan with the patient. The patient was provided an opportunity to ask questions and all were answered. The patient agreed with the plan and demonstrated an understanding of the instructions.   The patient was advised to call back or seek an in-person evaluation if the symptoms worsen or if the condition fails to improve as  anticipated.  Time spent 20 minutes  Delorise Jackson, MD

## 2019-08-23 ENCOUNTER — Other Ambulatory Visit: Payer: Self-pay

## 2019-08-23 ENCOUNTER — Other Ambulatory Visit (INDEPENDENT_AMBULATORY_CARE_PROVIDER_SITE_OTHER): Payer: PPO

## 2019-08-23 DIAGNOSIS — E119 Type 2 diabetes mellitus without complications: Secondary | ICD-10-CM | POA: Diagnosis not present

## 2019-08-23 DIAGNOSIS — I10 Essential (primary) hypertension: Secondary | ICD-10-CM

## 2019-08-23 LAB — LIPID PANEL
Cholesterol: 115 mg/dL (ref 0–200)
HDL: 38.7 mg/dL — ABNORMAL LOW (ref >39.00)
LDL Cholesterol: 58 mg/dL (ref 0–99)
NonHDL: 75.85
Total CHOL/HDL Ratio: 3
Triglycerides: 87 mg/dL (ref 0.0–149.0)
VLDL: 17.4 mg/dL (ref 0.0–40.0)

## 2019-08-23 LAB — CBC WITH DIFFERENTIAL/PLATELET
Basophils Absolute: 0 10*3/uL (ref 0.0–0.1)
Basophils Relative: 0.3 % (ref 0.0–3.0)
Eosinophils Absolute: 0.1 10*3/uL (ref 0.0–0.7)
Eosinophils Relative: 1.8 % (ref 0.0–5.0)
HCT: 39 % (ref 36.0–46.0)
Hemoglobin: 13.1 g/dL (ref 12.0–15.0)
Lymphocytes Relative: 30.5 % (ref 12.0–46.0)
Lymphs Abs: 1.6 10*3/uL (ref 0.7–4.0)
MCHC: 33.6 g/dL (ref 30.0–36.0)
MCV: 85.6 fl (ref 78.0–100.0)
Monocytes Absolute: 0.5 10*3/uL (ref 0.1–1.0)
Monocytes Relative: 9 % (ref 3.0–12.0)
Neutro Abs: 3.1 10*3/uL (ref 1.4–7.7)
Neutrophils Relative %: 58.4 % (ref 43.0–77.0)
Platelets: 153 10*3/uL (ref 150.0–400.0)
RBC: 4.56 Mil/uL (ref 3.87–5.11)
RDW: 13.8 % (ref 11.5–15.5)
WBC: 5.4 10*3/uL (ref 4.0–10.5)

## 2019-08-23 LAB — URINALYSIS, ROUTINE W REFLEX MICROSCOPIC
Bilirubin Urine: NEGATIVE
Ketones, ur: NEGATIVE
Leukocytes,Ua: NEGATIVE
Nitrite: NEGATIVE
Specific Gravity, Urine: 1.025 (ref 1.000–1.030)
Total Protein, Urine: NEGATIVE
Urine Glucose: NEGATIVE
Urobilinogen, UA: 0.2 (ref 0.0–1.0)
pH: 5 (ref 5.0–8.0)

## 2019-08-23 LAB — COMPREHENSIVE METABOLIC PANEL
ALT: 32 U/L (ref 0–35)
AST: 27 U/L (ref 0–37)
Albumin: 4.4 g/dL (ref 3.5–5.2)
Alkaline Phosphatase: 53 U/L (ref 39–117)
BUN: 20 mg/dL (ref 6–23)
CO2: 27 mEq/L (ref 19–32)
Calcium: 9.8 mg/dL (ref 8.4–10.5)
Chloride: 102 mEq/L (ref 96–112)
Creatinine, Ser: 0.79 mg/dL (ref 0.40–1.20)
GFR: 70.64 mL/min (ref >60.00)
Glucose, Bld: 162 mg/dL — ABNORMAL HIGH (ref 70–99)
Potassium: 4.2 mEq/L (ref 3.5–5.1)
Sodium: 138 mEq/L (ref 135–145)
Total Bilirubin: 0.8 mg/dL (ref 0.2–1.2)
Total Protein: 7.3 g/dL (ref 6.0–8.3)

## 2019-08-23 LAB — HEMOGLOBIN A1C: Hgb A1c MFr Bld: 8.3 % — ABNORMAL HIGH (ref 4.6–6.5)

## 2019-08-23 NOTE — Addendum Note (Signed)
Addended by: Fayrene Towner I on: 08/23/2019 09:58 AM   Modules accepted: Orders  

## 2019-08-23 NOTE — Addendum Note (Signed)
Addended by: Danila Eddie I on: 08/23/2019 09:58 AM   Modules accepted: Orders  

## 2019-08-23 NOTE — Addendum Note (Signed)
Addended by: Bonnell Public I on: 08/23/2019 09:58 AM   Modules accepted: Orders

## 2019-08-24 LAB — MICROALBUMIN / CREATININE URINE RATIO
Creatinine, Urine: 70 mg/dL (ref 20–275)
Microalb Creat Ratio: 23 mcg/mg creat (ref <30)
Microalb, Ur: 1.6 mg/dL

## 2019-09-03 ENCOUNTER — Telehealth: Payer: Self-pay | Admitting: Internal Medicine

## 2019-09-03 NOTE — Telephone Encounter (Signed)
Pt called wanting to know what  Dr. French Ana thoughts were on the [facility]

## 2019-09-04 ENCOUNTER — Other Ambulatory Visit: Payer: Self-pay | Admitting: Internal Medicine

## 2019-09-04 DIAGNOSIS — E1165 Type 2 diabetes mellitus with hyperglycemia: Secondary | ICD-10-CM

## 2019-09-04 MED ORDER — OZEMPIC (0.25 OR 0.5 MG/DOSE) 2 MG/1.5ML ~~LOC~~ SOPN: 0.2500 mg | PEN_INJECTOR | SUBCUTANEOUS | 2 refills | Status: DC

## 2019-09-04 MED ORDER — PEN NEEDLES 30G X 8 MM MISC: 1.0000 | 11 refills | Status: AC

## 2019-09-04 NOTE — Telephone Encounter (Signed)
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  She stated she will have daughter explain since she is Charity fundraiser. Instead of coming in for teaching nurse appointment,

## 2019-09-04 NOTE — Telephone Encounter (Signed)
Per your last note on patient: Type 2 diabetes mellitus without complication, without long-term current use of insulin (HCC) A1C 7.8  Cont meds  Consider ozempic or trulicity and stop januvia in future  Foot exam in future  Est AE will see 09/2019   You also sent patient information on Ozempic 03/18 to her mychart.  Please advise

## 2019-09-04 NOTE — Telephone Encounter (Signed)
Pt called back and said that she needs an answer today on the Ozempic she only has one pill left of the sitaGLIPtin (JANUVIA) 50 MG tablet Please contact pt

## 2019-09-04 NOTE — Telephone Encounter (Signed)
Sent ozempic to [facility]  Stop [place]  She will need RN visit for teaching how to use get her meds from pharm and bring for RN visit   Thanks TMS

## 2019-11-01 DIAGNOSIS — M5412 Radiculopathy, cervical region: Secondary | ICD-10-CM | POA: Diagnosis not present

## 2019-11-01 DIAGNOSIS — M9901 Segmental and somatic dysfunction of cervical region: Secondary | ICD-10-CM | POA: Diagnosis not present

## 2019-11-01 DIAGNOSIS — M5136 Other intervertebral disc degeneration, lumbar region: Secondary | ICD-10-CM | POA: Diagnosis not present

## 2019-11-01 DIAGNOSIS — M9903 Segmental and somatic dysfunction of lumbar region: Secondary | ICD-10-CM | POA: Diagnosis not present

## 2019-11-12 ENCOUNTER — Other Ambulatory Visit: Payer: Self-pay | Admitting: Internal Medicine

## 2019-11-12 MED ORDER — ATORVASTATIN CALCIUM 20 MG PO TABS: 20.0000 mg | ORAL_TABLET | Freq: Every day | ORAL | 3 refills | Status: DC

## 2019-11-15 ENCOUNTER — Other Ambulatory Visit: Payer: Self-pay | Admitting: Cardiovascular Disease

## 2019-11-15 NOTE — Telephone Encounter (Signed)
Pt last saw Dr Mariah Milling 01/21/19, last labs 08/23/19 Creat 0.79, age 79, weight 103.9kg, CrCl 99.37, based on CrCl pt is on appropriate dosage of Xarelto 20mg  QD.  Will refill rx.

## 2019-11-18 ENCOUNTER — Telehealth: Payer: Self-pay | Admitting: Internal Medicine

## 2019-11-18 MED ORDER — HYDROCHLOROTHIAZIDE 25 MG PO TABS: 25.0000 mg | ORAL_TABLET | Freq: Every day | ORAL | 1 refills | Status: DC

## 2019-11-18 NOTE — Telephone Encounter (Signed)
Medication sent in to preferred pharmacy per protocol.   

## 2019-11-18 NOTE — Telephone Encounter (Signed)
Patient needs a refill on her hydrochlorothiazide (HYDRODIURIL) 25 MG tablet

## 2019-11-27 ENCOUNTER — Telehealth: Payer: Self-pay | Admitting: Internal Medicine

## 2019-11-27 DIAGNOSIS — E1165 Type 2 diabetes mellitus with hyperglycemia: Secondary | ICD-10-CM

## 2019-11-27 MED ORDER — OZEMPIC (0.25 OR 0.5 MG/DOSE) 2 MG/1.5ML ~~LOC~~ SOPN: 0.2500 mg | PEN_INJECTOR | SUBCUTANEOUS | 2 refills | Status: DC

## 2019-11-27 NOTE — Telephone Encounter (Signed)
Pt needs refill on Semaglutide,0.25 or 0.5MG /DOS, (OZEMPIC, 0.25 OR 0.5 MG/DOSE,) 2 MG/1.5ML SOPN. Please sent to [facility] [address]. Pt has appt on 7/29 and needs its before then.

## 2019-12-18 ENCOUNTER — Ambulatory Visit: Payer: PPO | Admitting: Internal Medicine

## 2019-12-24 ENCOUNTER — Other Ambulatory Visit: Payer: Self-pay

## 2019-12-26 ENCOUNTER — Other Ambulatory Visit: Payer: Self-pay

## 2019-12-26 ENCOUNTER — Encounter: Payer: Self-pay | Admitting: Internal Medicine

## 2019-12-26 ENCOUNTER — Ambulatory Visit (INDEPENDENT_AMBULATORY_CARE_PROVIDER_SITE_OTHER): Payer: PPO | Admitting: Internal Medicine

## 2019-12-26 ENCOUNTER — Telehealth: Payer: Self-pay | Admitting: Internal Medicine

## 2019-12-26 VITALS — BP 126/70 | HR 85 | Temp 97.4°F | Ht 64.0 in | Wt 223.0 lb

## 2019-12-26 DIAGNOSIS — E119 Type 2 diabetes mellitus without complications: Secondary | ICD-10-CM | POA: Insufficient documentation

## 2019-12-26 DIAGNOSIS — E1169 Type 2 diabetes mellitus with other specified complication: Secondary | ICD-10-CM

## 2019-12-26 DIAGNOSIS — R339 Retention of urine, unspecified: Secondary | ICD-10-CM | POA: Diagnosis not present

## 2019-12-26 DIAGNOSIS — Z13818 Encounter for screening for other digestive system disorders: Secondary | ICD-10-CM | POA: Diagnosis not present

## 2019-12-26 DIAGNOSIS — R002 Palpitations: Secondary | ICD-10-CM

## 2019-12-26 DIAGNOSIS — K760 Fatty (change of) liver, not elsewhere classified: Secondary | ICD-10-CM

## 2019-12-26 DIAGNOSIS — I10 Essential (primary) hypertension: Secondary | ICD-10-CM

## 2019-12-26 DIAGNOSIS — E1159 Type 2 diabetes mellitus with other circulatory complications: Secondary | ICD-10-CM

## 2019-12-26 DIAGNOSIS — E669 Obesity, unspecified: Secondary | ICD-10-CM

## 2019-12-26 LAB — COMPREHENSIVE METABOLIC PANEL WITH GFR
ALT: 29 U/L (ref 0–35)
AST: 30 U/L (ref 0–37)
Albumin: 4.6 g/dL (ref 3.5–5.2)
Alkaline Phosphatase: 52 U/L (ref 39–117)
BUN: 17 mg/dL (ref 6–23)
CO2: 27 meq/L (ref 19–32)
Calcium: 9.9 mg/dL (ref 8.4–10.5)
Chloride: 102 meq/L (ref 96–112)
Creatinine, Ser: 0.83 mg/dL (ref 0.40–1.20)
GFR: 66.66 mL/min
Glucose, Bld: 139 mg/dL — ABNORMAL HIGH (ref 70–99)
Potassium: 3.6 meq/L (ref 3.5–5.1)
Sodium: 137 meq/L (ref 135–145)
Total Bilirubin: 1.2 mg/dL (ref 0.2–1.2)
Total Protein: 7.3 g/dL (ref 6.0–8.3)

## 2019-12-26 LAB — LIPID PANEL
Cholesterol: 94 mg/dL (ref 0–200)
HDL: 44.6 mg/dL
LDL Cholesterol: 33 mg/dL (ref 0–99)
NonHDL: 49.35
Total CHOL/HDL Ratio: 2
Triglycerides: 83 mg/dL (ref 0.0–149.0)
VLDL: 16.6 mg/dL (ref 0.0–40.0)

## 2019-12-26 LAB — HEMOGLOBIN A1C: Hgb A1c MFr Bld: 7.7 % — ABNORMAL HIGH (ref 4.6–6.5)

## 2019-12-26 LAB — TSH: TSH: 4.34 u[IU]/mL (ref 0.35–4.50)

## 2019-12-26 NOTE — Progress Notes (Signed)
Chief Complaint  Patient presents with  . Follow-up   F/u  1. HTN and DM 2 initially on ozempic had dizziness, increased HR with h/o Afib, and diarrhea and also having urinary retention do not see this as side effect  Not taking noravsc 2.5 mg qd, hctz 25 mg qd, metofmrin xr 500 mg qd, toprol xl 100 mg qd  2. Glaucoma pressure improved on trusopt   Review of Systems  Constitutional: Positive for weight loss.       Down 6lbs   HENT: Negative for hearing loss.   Eyes: Negative for blurred vision.  Respiratory: Negative for shortness of breath.   Cardiovascular: Positive for palpitations. Negative for chest pain.  Gastrointestinal: Positive for diarrhea. Negative for abdominal pain.  Musculoskeletal: Negative for falls.  Skin: Negative for rash.  Neurological: Negative for headaches.  Psychiatric/Behavioral: Negative for depression.   Past Medical History:  Diagnosis Date  . Chicken pox   . Coronary artery disease    a. 2004 s/p DES to mid-LAD; b. 10/2005 Cath: LM nl, LAD patent stent, 61m D1 nl, LCX nl, OM1/2 nl, RCA nl, EF 60%; c. 05/2015 MV: EF 83%, no ischemia. Low risk.  . Diastolic dysfunction    a. 09/2014 Echo: Nl EF. Gr1 DD. Very mild AS. Mild MR. Nl RV fxn. PASP 337mg.  . DM (diabetes mellitus), type 2 ([place]  . Glaucoma   . Heart murmur   . Hyperlipidemia   . Hypertension   . PAF (paroxysmal atrial fibrillation) ([place]   a. 10/2014 Holter: PAF b. quit taking Eliquis in 01/2015 due to myalgias; c. CHA2DS2VASc = 6-->Xarelto.   Past Surgical History:  Procedure Laterality Date  . CARDIAC CATHETERIZATION     [place] x1 stent  . coronary artery stent  2004  . TONSILLECTOMY AND ADENOIDECTOMY  1949   Family History  Problem Relation Age of Onset  . Hyperlipidemia Mother   . Hypertension Mother   . Hyperlipidemia Father   . Hypertension Father   . Heart disease Father [id number]. Heart attack Father    Social History   Socioeconomic History  . Marital status:  Divorced    Spouse name: Not on file  . Number of children: Not on file  . Years of education: Not on file  . Highest education level: Not on file  Occupational History  . Not on file  Tobacco Use  . Smoking status: Former SmResearch scientist (life sciences). Smokeless tobacco: Never Used  . Tobacco comment: quit 25 years ago  Vaping Use  . Vaping Use: Never used  Substance and Sexual Activity  . Alcohol use: Yes    Alcohol/week: 0.0 standard drinks    Comment: very rarely  . Drug use: No  . Sexual activity: Not on file  Other Topics Concern  . Not on file  Social History Narrative   [place], granddaughter. in home.      Work - retired.   Diet - regular   Exercise - [employer]   Social Determinants of Health   Financial Resource Strain:   . Difficulty of Paying Living Expenses:   Food Insecurity:   . Worried About RuCharity fundraisern the Last Year:   . RaArboriculturistn the Last Year:   Transportation Needs:   . LaFilm/video editorMedical):   . Marland Kitchenack of Transportation (Non-Medical):   Physical Activity:   . Days of Exercise  per Week:   . Minutes of Exercise per Session:   Stress:   . Feeling of Stress :   Social Connections:   . Frequency of Communication with Friends and Family:   . Frequency of Social Gatherings with Friends and Family:   . Attends Religious Services:   . Active Member of Clubs or Organizations:   . Attends Archivist Meetings:   Marland Kitchen Marital Status:   Intimate Partner Violence:   . Fear of Current or Ex-Partner:   . Emotionally Abused:   Marland Kitchen Physically Abused:   . Sexually Abused:    Current Meds  Medication Sig  . atorvastatin (LIPITOR) 20 MG tablet Take 1 tablet (20 mg total) by mouth at bedtime.  . dorzolamide (TRUSOPT) 2 % ophthalmic solution Place 1 drop into the left eye 2 times daily.  Marland Kitchen glucose blood (ONE TOUCH ULTRA TEST) test strip 1 each by Other route daily. Use as instructed  E11.9  .  hydrochlorothiazide (HYDRODIURIL) 25 MG tablet Take 1 tablet (25 mg total) by mouth daily.  . Insulin Pen Needle (PEN NEEDLES) 30G X 8 MM MISC 1 Device by Does not apply route once a week.  . latanoprost (XALATAN) 0.005 % ophthalmic solution INSTILL 1 DROP INTO EACH EYE AT BEDTIME  . metFORMIN (GLUCOPHAGE-XR) 500 MG 24 hr tablet Take 2 tablets (1,000 mg total) by mouth in the morning and at bedtime. With food. ER formulation  . metoprolol succinate (TOPROL-XL) 100 MG 24 hr tablet Take 1 tablet (100 mg total) by mouth as directed. 1 tablet Daily in the AM and 1/2 tablet in the PM as needed fast heart rate/palpitations  . metroNIDAZOLE (METROGEL) 1 % gel Apply topically daily as needed.  Glory Rosebush Delica Lancets 87F MISC Use up to 3 times daily to check blood sugar.  . Semaglutide,0.25 or 0.5MG/DOS, (OZEMPIC, 0.25 OR 0.5 MG/DOSE,) 2 MG/1.5ML SOPN Inject 0.1875 mLs (0.25 mg total) into the skin once a week. X 2 weeks then increase to 0.5 1x per week  . XARELTO 20 MG TABS tablet TAKE 1 TABLET BY MOUTH ONCE DAILY WITH  SUPPER   Allergies  Allergen Reactions  . Niaspan [Niacin Er] Rash  . Brinzolamide-Brimonidine Other (See Comments)    Burning, Headache itching  . Januvia [Sitagliptin]     Joint pain, h/a, URI  . Lisinopril Cough  . Netarsudil-Latanoprost Other (See Comments)    Headache itching  . Timolol Other (See Comments)    Headache itching   No results found for this or any previous visit (from the past 2160 hour(s)). Objective  Body mass index is 38.28 kg/m. Wt Readings from Last 3 Encounters:  12/26/19 (!) 223 lb (101.2 kg)  08/15/19 229 lb (103.9 kg)  04/16/19 229 lb (103.9 kg)   Temp Readings from Last 3 Encounters:  12/26/19 (!) 97.4 F (36.3 C) (Oral)  04/16/19 (!) 97.3 F (36.3 C) (Skin)  06/13/18 98.3 F (36.8 C) (Oral)   BP Readings from Last 3 Encounters:  12/26/19 126/70  08/15/19 119/75  04/16/19 (!) 142/76   Pulse Readings from Last 3 Encounters:   12/26/19 85  08/15/19 86  04/16/19 85    Physical Exam Vitals and nursing note reviewed.  Constitutional:      Appearance: Normal appearance. She is well-developed and well-groomed. She is obese.  HENT:     Head: Normocephalic and atraumatic.  Eyes:     Conjunctiva/sclera: Conjunctivae normal.     Pupils: Pupils are equal, round, and  reactive to light.  Cardiovascular:     Rate and Rhythm: Normal rate and regular rhythm.     Heart sounds: Normal heart sounds. No murmur heard.   Pulmonary:     Effort: Pulmonary effort is normal.     Breath sounds: Normal breath sounds.  Skin:    General: Skin is warm and dry.  Neurological:     General: No focal deficit present.     Mental Status: She is alert and oriented to person, place, and time. Mental status is at baseline.     Gait: Gait normal.  Psychiatric:        Attention and Perception: Attention and perception normal.        Mood and Affect: Mood and affect normal.        Speech: Speech normal.        Behavior: Behavior normal. Behavior is cooperative.        Thought Content: Thought content normal.        Cognition and Memory: Cognition and memory normal.        Judgment: Judgment normal.     Assessment  Plan   Obesity, diabetes, and hypertension syndrome ([place]) - Plan: Comprehensive metabolic panel, Lipid panel, Hemoglobin A1calpitations - Plan: TSH  Hypertension associated with diabetes ([place]) - Plan: Comprehensive metabolic panel, Lipid panel, Hemoglobin A1c Cont meds   Fatty liver Obesity  Healthy diet and exercise   Urine retention - Plan: Urinalysis, Routine w reflex microscopic, Urine Culture   HM Had flu shotutd prevnar hadand pna 25 Zoster had, declines shingrix Tdap utd covid vx J&J x 1  Check hep C today   DEXA 04/07/16 neg colonoscopy had 03/13/12 out of age age window pap mammo declines 7/15/20and 11/17/2020and 08/15/19  Skin no issues rec calcium 600 mg bid, vit D 3rec 2000-25000IU qd   Declines [facility] Eye 12/10/18 saw Dr. Tammi Sou referral to [facility] instead of [facility]   Provider: Dr. Olivia Mackie McLean-Scocuzza-Internal Medicine

## 2019-12-26 NOTE — Telephone Encounter (Signed)
Pt was returning call 

## 2019-12-26 NOTE — Patient Instructions (Signed)
<130/<80  BP goal   DASH Eating Plan DASH stands for "Dietary Approaches to Stop Hypertension." The DASH eating plan is a healthy eating plan that has been shown to reduce high blood pressure (hypertension). It may also reduce your risk for type 2 diabetes, heart disease, and stroke. The DASH eating plan may also help with weight loss. What are tips for following this plan?  General guidelines  Avoid eating more than 2,300 mg (milligrams) of salt (sodium) a day. If you have hypertension, you may need to reduce your sodium intake to 1,500 mg a day.  Limit alcohol intake to no more than 1 drink a day for nonpregnant women and 2 drinks a day for men. One drink equals 12 oz of beer, 5 oz of wine, or 1 oz of hard liquor.  Work with your health care provider to maintain a healthy body weight or to lose weight. Ask what an ideal weight is for you.  Get at least 30 minutes of exercise that causes your heart to beat faster (aerobic exercise) most days of the week. Activities may include walking, swimming, or biking.  Work with your health care provider or diet and nutrition specialist (dietitian) to adjust your eating plan to your individual calorie needs. Reading food labels   Check food labels for the amount of sodium per serving. Choose foods with less than 5 percent of the Daily Value of sodium. Generally, foods with less than 300 mg of sodium per serving fit into this eating plan.  To find whole grains, look for the word "whole" as the first word in the ingredient list. Shopping  Buy products labeled as "low-sodium" or "no salt added."  Buy fresh foods. Avoid canned foods and premade or frozen meals. Cooking  Avoid adding salt when cooking. Use salt-free seasonings or herbs instead of table salt or sea salt. Check with your health care provider or pharmacist before using salt substitutes.  Do not fry foods. Cook foods using healthy methods such as baking, boiling, grilling, and  broiling instead.  Cook with heart-healthy oils, such as olive, canola, soybean, or sunflower oil. Meal planning  Eat a balanced diet that includes: ? 5 or more servings of fruits and vegetables each day. At each meal, try to fill half of your plate with fruits and vegetables. ? Up to 6-8 servings of whole grains each day. ? Less than 6 oz of lean meat, poultry, or fish each day. A 3-oz serving of meat is about the same size as a deck of cards. One egg equals 1 oz. ? 2 servings of low-fat dairy each day. ? A serving of nuts, seeds, or beans 5 times each week. ? Heart-healthy fats. Healthy fats called Omega-3 fatty acids are found in foods such as flaxseeds and coldwater fish, like sardines, salmon, and mackerel.  Limit how much you eat of the following: ? Canned or prepackaged foods. ? Food that is high in trans fat, such as fried foods. ? Food that is high in saturated fat, such as fatty meat. ? Sweets, desserts, sugary drinks, and other foods with added sugar. ? Full-fat dairy products.  Do not salt foods before eating.  Try to eat at least 2 vegetarian meals each week.  Eat more home-cooked food and less restaurant, buffet, and fast food.  When eating at a restaurant, ask that your food be prepared with less salt or no salt, if possible. What foods are recommended? The items listed may not be a complete  list. Talk with your dietitian about what dietary choices are best for you. Grains Whole-grain or whole-wheat bread. Whole-grain or whole-wheat pasta. Brown rice. [place]row. Bulgur. Whole-grain and low-sodium cereals. Pita bread. Low-fat, low-sodium crackers. Whole-wheat flour tortillas. Vegetables Fresh or frozen vegetables (raw, steamed, roasted, or grilled). Low-sodium or reduced-sodium tomato and vegetable juice. Low-sodium or reduced-sodium tomato sauce and tomato paste. Low-sodium or reduced-sodium canned vegetables. Fruits All fresh, dried, or frozen fruit. Canned  fruit in natural juice (without added sugar). Meat and other protein foods Skinless chicken or [place]. Ground chicken or [place]. Pork with fat trimmed off. Fish and seafood. Egg whites. Dried beans, peas, or lentils. Unsalted nuts, nut butters, and seeds. Unsalted canned beans. Lean cuts of beef with fat trimmed off. Low-sodium, lean deli meat. Dairy Low-fat (1%) or fat-free (skim) milk. Fat-free, low-fat, or reduced-fat cheeses. Nonfat, low-sodium ricotta or cottage cheese. Low-fat or nonfat yogurt. Low-fat, low-sodium cheese. Fats and oils Soft margarine without trans fats. Vegetable oil. Low-fat, reduced-fat, or light mayonnaise and salad dressings (reduced-sodium). Canola, safflower, olive, soybean, and sunflower oils. Avocado. Seasoning and other foods Herbs. Spices. Seasoning mixes without salt. Unsalted popcorn and pretzels. Fat-free sweets. What foods are not recommended? The items listed may not be a complete list. Talk with your dietitian about what dietary choices are best for you. Grains Baked goods made with fat, such as croissants, muffins, or some breads. Dry pasta or rice meal packs. Vegetables Creamed or fried vegetables. Vegetables in a cheese sauce. Regular canned vegetables (not low-sodium or reduced-sodium). Regular canned tomato sauce and paste (not low-sodium or reduced-sodium). Regular tomato and vegetable juice (not low-sodium or reduced-sodium). Angie Fava. Olives. Fruits Canned fruit in a light or heavy syrup. Fried fruit. Fruit in cream or butter sauce. Meat and other protein foods Fatty cuts of meat. Ribs. Fried meat. Berniece Salines. Sausage. Bologna and other processed lunch meats. Salami. Fatback. Hotdogs. Bratwurst. Salted nuts and seeds. Canned beans with added salt. Canned or smoked fish. Whole eggs or egg yolks. Chicken or [place] with skin. Dairy Whole or 2% milk, cream, and half-and-half. Whole or full-fat cream cheese. Whole-fat or sweetened yogurt. Full-fat cheese.  Nondairy creamers. Whipped toppings. Processed cheese and cheese spreads. Fats and oils Butter. Stick margarine. Lard. Shortening. Ghee. Bacon fat. Tropical oils, such as coconut, palm kernel, or palm oil. Seasoning and other foods Salted popcorn and pretzels. Onion salt, garlic salt, seasoned salt, table salt, and sea salt. Worcestershire sauce. Tartar sauce. Barbecue sauce. Teriyaki sauce. Soy sauce, including reduced-sodium. Steak sauce. Canned and packaged gravies. Fish sauce. Oyster sauce. Cocktail sauce. Horseradish that you find on the shelf. Ketchup. Mustard. Meat flavorings and tenderizers. Bouillon cubes. Hot sauce and Tabasco sauce. Premade or packaged marinades. Premade or packaged taco seasonings. Relishes. Regular salad dressings. Where to find more information:  National Heart, Lung, and [place]: https://wilson-eaton.com/  American Heart Association: www.heart.org Summary  The DASH eating plan is a healthy eating plan that has been shown to reduce high blood pressure (hypertension). It may also reduce your risk for type 2 diabetes, heart disease, and stroke.  With the DASH eating plan, you should limit salt (sodium) intake to 2,300 mg a day. If you have hypertension, you may need to reduce your sodium intake to 1,500 mg a day.  When on the DASH eating plan, aim to eat more fresh fruits and vegetables, whole grains, lean proteins, low-fat dairy, and heart-healthy fats.  Work with your health care provider or diet and nutrition specialist (dietitian) to  adjust your eating plan to your individual calorie needs. This information is not intended to replace advice given to you by your health care provider. Make sure you discuss any questions you have with your health care provider. Document Revised: 04/28/2017 Document Reviewed: 05/09/2016 Elsevier Patient Education  2020 [employer].

## 2019-12-27 ENCOUNTER — Other Ambulatory Visit: Payer: Self-pay | Admitting: Internal Medicine

## 2019-12-27 DIAGNOSIS — E1165 Type 2 diabetes mellitus with hyperglycemia: Secondary | ICD-10-CM

## 2019-12-27 LAB — URINALYSIS, ROUTINE W REFLEX MICROSCOPIC
Bacteria, UA: NONE SEEN /HPF
Bilirubin Urine: NEGATIVE
Glucose, UA: NEGATIVE
Hyaline Cast: NONE SEEN /LPF
Ketones, ur: NEGATIVE
Leukocytes,Ua: NEGATIVE
Nitrite: NEGATIVE
Protein, ur: NEGATIVE
Specific Gravity, Urine: 1.022 (ref 1.001–1.03)
Squamous Epithelial / HPF: NONE SEEN /HPF (ref <=5)
WBC, UA: NONE SEEN /HPF (ref 0–5)
pH: <=5 (ref 5.0–8.0)

## 2019-12-27 LAB — URINE CULTURE
MICRO NUMBER:: 10765110
SPECIMEN QUALITY:: ADEQUATE

## 2019-12-27 LAB — HEPATITIS C ANTIBODY
Hepatitis C Ab: NONREACTIVE
SIGNAL TO CUT-OFF: 0.01 (ref <1.00)

## 2019-12-27 MED ORDER — OZEMPIC (0.25 OR 0.5 MG/DOSE) 2 MG/1.5ML ~~LOC~~ SOPN: 0.5000 mg | PEN_INJECTOR | SUBCUTANEOUS | 5 refills | Status: AC

## 2019-12-27 NOTE — Telephone Encounter (Signed)
Pt called returning your call 

## 2019-12-27 NOTE — Telephone Encounter (Signed)
Patient informed and verbalized understanding of lab results  

## 2020-01-02 ENCOUNTER — Encounter: Payer: Self-pay | Admitting: Internal Medicine

## 2020-01-02 NOTE — Telephone Encounter (Signed)
-----   Message from Bevelyn Buckles, MD sent at 12/30/2019  9:39 PM EDT ----- Urine culture negative  Trace blood in urine  Does she want to see urology to follow up on this?

## 2020-01-28 NOTE — Progress Notes (Signed)
Cardiology Office Note  Date:  01/29/2020   ID:  Gabriela Reed, DOB 1941-05-10, MRN [id number]  PCP:  McLean-Scocuzza, Pasty Spillers, MD   Chief Complaint  Patient presents with  . office visit    12 month F/U; Meds verbally reviewed with patient.    HPI:  Gabriela Reed is a 79 y.o.  woman with history of  morbid obesity, Diabetes type 2 paroxysmal atrial fibrillation,  coronary artery disease with stent to the LAD in 2004, presenting for routine follow-up of her coronary artery disease and PAF  Sedentary, not going to the [facility] Tried walking in the house  Blood pressure well controlled at homt Periodic palpitations,  HBA1C 7.7 to 8 Total chol 94  Takes metoprolol 100 in the AM Pm 1/2 dose sometimes   In general feeling well, no significant chest pain concerning for angina, no shortness of breath  EKG personally reviewed by myself on todays visit shows Atrial fibrillation with rate 74 bpm Relatively unchanged from prior EKGs  Other past medical history reviewed Laurette Schimke 02/2018 Atrial; fib noted 04/2018 Started on flecainide had side effects, SOB, tired, dirrhea, H/A Stopped the pill after 10 days Still in atrial fibrillation Taking flecainide as needed Only taking metoprolol 100 daily, rarely 50 extra   Prior stress test 06/25/2015 showing no ischemia   hospital June 25 2015 with chest pain, paroxysmal atrial fibrillation  Long discussion, she agreed to restart anticoagulation  November 27, 2014:  event monitor and was found to have atrial fibrillation .   PMH:   has a past medical history of Chicken pox, Coronary artery disease, Diastolic dysfunction, DM (diabetes mellitus), type 2 (HCC), Glaucoma, Heart murmur, Hyperlipidemia, Hypertension, and PAF (paroxysmal atrial fibrillation) (HCC).  PSH:    Past Surgical History:  Procedure Laterality Date  . CARDIAC CATHETERIZATION     [place] x1 stent  . coronary artery stent  2004  . TONSILLECTOMY AND  ADENOIDECTOMY  1949    Current Outpatient Medications  Medication Sig Dispense Refill  . atorvastatin (LIPITOR) 20 MG tablet Take 1 tablet (20 mg total) by mouth at bedtime. 90 tablet 3  . dorzolamide (TRUSOPT) 2 % ophthalmic solution Place 1 drop into the left eye 2 times daily.    Marland Kitchen glucose blood (ONE TOUCH ULTRA TEST) test strip 1 each by Other route daily. Use as instructed  E11.9 100 each 3  . hydrochlorothiazide (HYDRODIURIL) 25 MG tablet Take 1 tablet (25 mg total) by mouth daily. 90 tablet 1  . Insulin Pen Needle (PEN NEEDLES) 30G X 8 MM MISC 1 Device by Does not apply route once a week. 30 each 11  . latanoprost (XALATAN) 0.005 % ophthalmic solution INSTILL 1 DROP INTO EACH EYE AT BEDTIME  5  . metFORMIN (GLUCOPHAGE-XR) 500 MG 24 hr tablet Take 2 tablets (1,000 mg total) by mouth in the morning and at bedtime. With food. ER formulation 360 tablet 3  . metoprolol succinate (TOPROL-XL) 100 MG 24 hr tablet Take 1 tablet (100 mg total) by mouth as directed. 1 tablet Daily in the AM and 1/2 tablet in the PM as needed fast heart rate/palpitations 135 tablet 3  . metroNIDAZOLE (METROGEL) 1 % gel Apply topically daily as needed. 60 g 11  . OneTouch Delica Lancets 33G MISC Use up to 3 times daily to check blood sugar. 300 each 5  . Semaglutide,0.25 or 0.5MG /DOS, (OZEMPIC, 0.25 OR 0.5 MG/DOSE,) 2 MG/1.5ML SOPN Inject 0.375 mLs (0.5 mg total) into the skin once a week.  1 pen 5  . XARELTO 20 MG TABS tablet TAKE 1 TABLET BY MOUTH ONCE DAILY WITH  SUPPER 90 tablet 1   No current facility-administered medications for this visit.     Allergies:   Niaspan [niacin er], Brinzolamide-brimonidine, Januvia [sitagliptin], Lisinopril, Netarsudil-latanoprost, and Timolol   Social History:  The patient  reports that she has quit smoking. She has never used smokeless tobacco. She reports current alcohol use. She reports that she does not use drugs.   Family History:   family history includes Heart attack in  her father; Heart disease (age of onset: 23) in her father; Hyperlipidemia in her father and mother; Hypertension in her father and mother.    Review of Systems: Review of Systems  Constitutional: Negative.   HENT: Negative.   Respiratory: Negative.   Cardiovascular: Positive for palpitations.  Gastrointestinal: Negative.   Musculoskeletal: Negative.   Neurological: Negative.   Psychiatric/Behavioral: Negative.   All other systems reviewed and are negative.    PHYSICAL EXAM: VS:  BP (!) 150/94 (BP Location: Left Arm, Patient Position: Sitting, Cuff Size: Large)   Pulse 84   Ht 5\' 4"  (1.626 m)   Wt 222 lb (100.7 kg)   SpO2 96%   BMI 38.11 kg/m  , BMI Body mass index is 38.11 kg/m.  Constitutional:  oriented to person, place, and time. No distress.  HENT:  Head: Grossly normal Eyes:  no discharge. No scleral icterus.  Neck: No JVD, no carotid bruits  Cardiovascular: Regular rate and rhythm, no murmurs appreciated Pulmonary/Chest: Clear to auscultation bilaterally, no wheezes or rails Abdominal: Soft.  no distension.  no tenderness.  Musculoskeletal: Normal range of motion Neurological:  normal muscle tone. Coordination normal. No atrophy Skin: Skin warm and dry Psychiatric: normal affect, pleasant   Recent Labs: 08/23/2019: Hemoglobin 13.1; Platelets 153.0 12/26/2019: ALT 29; BUN 17; Creatinine, Ser 0.83; Potassium 3.6; Sodium 137; TSH 4.34    Lipid Panel Lab Results  Component Value Date   CHOL 94 12/26/2019   HDL 44.60 12/26/2019   LDLCALC 33 12/26/2019   TRIG 83.0 12/26/2019      Wt Readings from Last 3 Encounters:  01/29/20 222 lb (100.7 kg)  12/26/19 (!) 223 lb (101.2 kg)  08/15/19 229 lb (103.9 kg)       ASSESSMENT AND PLAN:  Permanent atrial fibrillation (HCC) Relatively asymptomatic We have recommended we continue metoprolol 100 in the morning, extra 50 as needed in the p.m. for palpitations Continue Xarelto 20 daily  Coronary artery disease  of native artery of native heart with stable angina pectoris (HCC) Currently with no symptoms of angina. No further workup at this time. Continue current medication regimen. Stable Discussed high diabetes numbers  Essential hypertension Numbers well controlled at home, she is even taking these measurements prior to medications Suggested she take some measurements several hours after she takes her morning medication  Hyperlipidemia with target LDL less than 70 Cholesterol is at goal on the current lipid regimen. No changes to the medications were made.  Type 2 diabetes mellitus with other circulatory complication, without long-term current use of insulin (HCC) Long discussion with her to start walking program, " it is too hot" Discussed diet Goal hemoglobin A1c 6 range  Morbid obesity (HCC) Recommend she check out the forever fit program in the hospital Needs to start walking more    Total encounter time more than 25 minutes  Greater than 50% was spent in counseling and coordination of care with the patient  No orders of the defined types were placed in this encounter.    Signed, Dossie Arbour, M.D., Ph.D. 01/29/2020  [facility] [place], [place] [phone]

## 2020-01-29 ENCOUNTER — Other Ambulatory Visit: Payer: Self-pay

## 2020-01-29 ENCOUNTER — Ambulatory Visit: Payer: PPO | Admitting: Cardiovascular Disease

## 2020-01-29 ENCOUNTER — Encounter: Payer: Self-pay | Admitting: Cardiovascular Disease

## 2020-01-29 VITALS — BP 150/94 | HR 84 | Ht 64.0 in | Wt 222.0 lb

## 2020-01-29 DIAGNOSIS — I4819 Other persistent atrial fibrillation: Secondary | ICD-10-CM

## 2020-01-29 DIAGNOSIS — E785 Hyperlipidemia, unspecified: Secondary | ICD-10-CM

## 2020-01-29 DIAGNOSIS — I10 Essential (primary) hypertension: Secondary | ICD-10-CM | POA: Diagnosis not present

## 2020-01-29 DIAGNOSIS — E119 Type 2 diabetes mellitus without complications: Secondary | ICD-10-CM

## 2020-01-29 DIAGNOSIS — I25118 Atherosclerotic heart disease of native coronary artery with other forms of angina pectoris: Secondary | ICD-10-CM

## 2020-01-29 MED ORDER — METOPROLOL SUCCINATE ER 100 MG PO TB24: ORAL_TABLET | ORAL | 3 refills | Status: AC

## 2020-01-29 NOTE — Patient Instructions (Signed)
Medication Instructions:  No changes  If you need a refill on your cardiac medications before your next appointment, please call your pharmacy.    Lab work: No new labs needed   If you have labs (blood work) drawn today and your tests are completely normal, you will receive your results only by: . MyChart Message (if you have MyChart) OR . A paper copy in the mail If you have any lab test that is abnormal or we need to change your treatment, we will call you to review the results.   Testing/Procedures: No new testing needed   Follow-Up: At [facility], you and your health needs are our priority.  As part of our continuing mission to provide you with exceptional heart care, we have created designated Provider Care Teams.  These Care Teams include your primary Cardiologist (physician) and Advanced Practice Providers (APPs -  Physician Assistants and Nurse Practitioners) who all work together to provide you with the care you need, when you need it.  . You will need a follow up appointment in 12 months  . Providers on your designated Care Team:   . Christopher Berge, NP . Ryan Dunn, PA-C . Jacquelyn Visser, PA-C  Any Other Special Instructions Will Be Listed Below (If Applicable).  COVID-19 Vaccine Information can be found at: [email or web address] For questions related to vaccine distribution or appointments, please email [email or web address] or call [phone].     

## 2020-01-29 NOTE — Addendum Note (Signed)
Addended by: Sherri Rad C on: 01/29/2020 09:02 AM   Modules accepted: Orders

## 2020-02-25 DIAGNOSIS — M5412 Radiculopathy, cervical region: Secondary | ICD-10-CM | POA: Diagnosis not present

## 2020-02-25 DIAGNOSIS — M5136 Other intervertebral disc degeneration, lumbar region: Secondary | ICD-10-CM | POA: Diagnosis not present

## 2020-02-25 DIAGNOSIS — M9901 Segmental and somatic dysfunction of cervical region: Secondary | ICD-10-CM | POA: Diagnosis not present

## 2020-02-25 DIAGNOSIS — M9903 Segmental and somatic dysfunction of lumbar region: Secondary | ICD-10-CM | POA: Diagnosis not present

## 2020-03-20 ENCOUNTER — Other Ambulatory Visit: Payer: Self-pay

## 2020-03-20 ENCOUNTER — Ambulatory Visit (INDEPENDENT_AMBULATORY_CARE_PROVIDER_SITE_OTHER): Payer: PPO

## 2020-03-20 DIAGNOSIS — Z23 Encounter for immunization: Secondary | ICD-10-CM

## 2020-03-26 DIAGNOSIS — H401411 Capsular glaucoma with pseudoexfoliation of lens, right eye, mild stage: Secondary | ICD-10-CM | POA: Diagnosis not present

## 2020-04-10 DIAGNOSIS — M9903 Segmental and somatic dysfunction of lumbar region: Secondary | ICD-10-CM | POA: Diagnosis not present

## 2020-04-10 DIAGNOSIS — M9901 Segmental and somatic dysfunction of cervical region: Secondary | ICD-10-CM | POA: Diagnosis not present

## 2020-04-10 DIAGNOSIS — M5412 Radiculopathy, cervical region: Secondary | ICD-10-CM | POA: Diagnosis not present

## 2020-04-10 DIAGNOSIS — M5136 Other intervertebral disc degeneration, lumbar region: Secondary | ICD-10-CM | POA: Diagnosis not present

## 2020-05-11 DIAGNOSIS — H401423 Capsular glaucoma with pseudoexfoliation of lens, left eye, severe stage: Secondary | ICD-10-CM | POA: Diagnosis not present

## 2020-05-14 ENCOUNTER — Telehealth: Payer: Self-pay | Admitting: Internal Medicine

## 2020-05-14 MED ORDER — HYDROCHLOROTHIAZIDE 25 MG PO TABS: 25.0000 mg | ORAL_TABLET | Freq: Every day | ORAL | 1 refills | Status: DC

## 2020-05-14 NOTE — Telephone Encounter (Signed)
Patient called in for refiill for hydrochlorothiazide (HYDRODIURIL) 25 MG tablet

## 2020-05-15 ENCOUNTER — Other Ambulatory Visit: Payer: Self-pay

## 2020-05-15 MED ORDER — RIVAROXABAN 20 MG PO TABS: 20.0000 mg | ORAL_TABLET | Freq: Every day | ORAL | 1 refills | Status: DC

## 2020-05-15 NOTE — Telephone Encounter (Signed)
Please review for refill on Xarelto 20 mg. 

## 2020-05-15 NOTE — Telephone Encounter (Signed)
Pt last saw Dr Mariah Milling 01/29/20, last labs 12/26/19 Creat 0.83, age 79, weight 100.7kg, CrCl 90.24, based on CrCl pt is on appropriate dosage of Xarelto 20mg  QD.  Will refill rx.

## 2020-05-26 ENCOUNTER — Telehealth: Payer: Self-pay

## 2020-05-26 DIAGNOSIS — E119 Type 2 diabetes mellitus without complications: Secondary | ICD-10-CM

## 2020-05-26 DIAGNOSIS — E1165 Type 2 diabetes mellitus with hyperglycemia: Secondary | ICD-10-CM

## 2020-05-26 MED ORDER — GLUCOSE BLOOD VI STRP: 1.0000 | ORAL_STRIP | Freq: Every day | 3 refills | Status: AC

## 2020-05-26 MED ORDER — ONETOUCH DELICA LANCETS 33G MISC: 5 refills | Status: AC

## 2020-05-26 NOTE — Telephone Encounter (Signed)
Pt needs refill on lancets and test strips sent to [facility].

## 2020-05-27 DIAGNOSIS — M9903 Segmental and somatic dysfunction of lumbar region: Secondary | ICD-10-CM | POA: Diagnosis not present

## 2020-05-27 DIAGNOSIS — M5136 Other intervertebral disc degeneration, lumbar region: Secondary | ICD-10-CM | POA: Diagnosis not present

## 2020-05-27 DIAGNOSIS — M9901 Segmental and somatic dysfunction of cervical region: Secondary | ICD-10-CM | POA: Diagnosis not present

## 2020-05-27 DIAGNOSIS — M5412 Radiculopathy, cervical region: Secondary | ICD-10-CM | POA: Diagnosis not present

## 2020-06-09 DIAGNOSIS — H401123 Primary open-angle glaucoma, left eye, severe stage: Secondary | ICD-10-CM | POA: Diagnosis not present

## 2020-06-18 ENCOUNTER — Other Ambulatory Visit: Payer: Self-pay

## 2020-06-18 ENCOUNTER — Encounter: Payer: Self-pay | Admitting: Internal Medicine

## 2020-06-18 ENCOUNTER — Ambulatory Visit (INDEPENDENT_AMBULATORY_CARE_PROVIDER_SITE_OTHER): Payer: PPO | Admitting: Internal Medicine

## 2020-06-18 VITALS — BP 132/88 | HR 96 | Temp 96.0°F | Ht 64.02 in | Wt 219.0 lb

## 2020-06-18 DIAGNOSIS — I152 Hypertension secondary to endocrine disorders: Secondary | ICD-10-CM | POA: Diagnosis not present

## 2020-06-18 DIAGNOSIS — I4819 Other persistent atrial fibrillation: Secondary | ICD-10-CM | POA: Diagnosis not present

## 2020-06-18 DIAGNOSIS — E559 Vitamin D deficiency, unspecified: Secondary | ICD-10-CM

## 2020-06-18 DIAGNOSIS — E669 Obesity, unspecified: Secondary | ICD-10-CM | POA: Diagnosis not present

## 2020-06-18 DIAGNOSIS — E1169 Type 2 diabetes mellitus with other specified complication: Secondary | ICD-10-CM | POA: Diagnosis not present

## 2020-06-18 DIAGNOSIS — E1165 Type 2 diabetes mellitus with hyperglycemia: Secondary | ICD-10-CM

## 2020-06-18 DIAGNOSIS — E1159 Type 2 diabetes mellitus with other circulatory complications: Secondary | ICD-10-CM | POA: Diagnosis not present

## 2020-06-18 DIAGNOSIS — Z1329 Encounter for screening for other suspected endocrine disorder: Secondary | ICD-10-CM

## 2020-06-18 LAB — LIPID PANEL
Cholesterol: 120 mg/dL (ref 0–200)
HDL: 43.1 mg/dL (ref >39.00)
LDL Cholesterol: 55 mg/dL (ref 0–99)
NonHDL: 76.65
Total CHOL/HDL Ratio: 3
Triglycerides: 110 mg/dL (ref 0.0–149.0)
VLDL: 22 mg/dL (ref 0.0–40.0)

## 2020-06-18 LAB — COMPREHENSIVE METABOLIC PANEL
ALT: 32 U/L (ref 0–35)
AST: 32 U/L (ref 0–37)
Albumin: 4.8 g/dL (ref 3.5–5.2)
Alkaline Phosphatase: 62 U/L (ref 39–117)
BUN: 21 mg/dL (ref 6–23)
CO2: 28 mEq/L (ref 19–32)
Calcium: 10 mg/dL (ref 8.4–10.5)
Chloride: 100 mEq/L (ref 96–112)
Creatinine, Ser: 0.81 mg/dL (ref 0.40–1.20)
GFR: 69.99 mL/min (ref >60.00)
Glucose, Bld: 159 mg/dL — ABNORMAL HIGH (ref 70–99)
Potassium: 4.1 mEq/L (ref 3.5–5.1)
Sodium: 137 mEq/L (ref 135–145)
Total Bilirubin: 0.9 mg/dL (ref 0.2–1.2)
Total Protein: 7.7 g/dL (ref 6.0–8.3)

## 2020-06-18 LAB — CBC WITH DIFFERENTIAL/PLATELET
Basophils Absolute: 0 10*3/uL (ref 0.0–0.1)
Basophils Relative: 0.4 % (ref 0.0–3.0)
Eosinophils Absolute: 0.1 10*3/uL (ref 0.0–0.7)
Eosinophils Relative: 2.2 % (ref 0.0–5.0)
HCT: 42 % (ref 36.0–46.0)
Hemoglobin: 14 g/dL (ref 12.0–15.0)
Lymphocytes Relative: 30.2 % (ref 12.0–46.0)
Lymphs Abs: 1.7 10*3/uL (ref 0.7–4.0)
MCHC: 33.4 g/dL (ref 30.0–36.0)
MCV: 85.3 fl (ref 78.0–100.0)
Monocytes Absolute: 0.5 10*3/uL (ref 0.1–1.0)
Monocytes Relative: 9 % (ref 3.0–12.0)
Neutro Abs: 3.2 10*3/uL (ref 1.4–7.7)
Neutrophils Relative %: 58.2 % (ref 43.0–77.0)
Platelets: 201 10*3/uL (ref 150.0–400.0)
RBC: 4.92 Mil/uL (ref 3.87–5.11)
RDW: 14 % (ref 11.5–15.5)
WBC: 5.5 10*3/uL (ref 4.0–10.5)

## 2020-06-18 LAB — HEMOGLOBIN A1C: Hgb A1c MFr Bld: 8.1 % — ABNORMAL HIGH (ref 4.6–6.5)

## 2020-06-18 LAB — VITAMIN D 25 HYDROXY (VIT D DEFICIENCY, FRACTURES): VITD: 24.48 ng/mL — ABNORMAL LOW (ref 30.00–100.00)

## 2020-06-18 MED ORDER — HYDROCHLOROTHIAZIDE 25 MG PO TABS: 25.0000 mg | ORAL_TABLET | Freq: Every day | ORAL | 3 refills | Status: DC

## 2020-06-18 MED ORDER — METFORMIN HCL ER 500 MG PO TB24: 1000.0000 mg | ORAL_TABLET | Freq: Two times a day (BID) | ORAL | 3 refills | Status: AC

## 2020-06-18 NOTE — Patient Instructions (Addendum)
Consider pfizer booster  Monitor BP goal <130/<80 You can take metoprolol at night if heart racing or elevated blood pressure   Glipizide Extended-release tablets-consider this low dose 2.5 or 5 mg  What is this medicine? GLIPIZIDE (GLIP i zide) helps to treat type 2 diabetes. It is combined with diet and exercise. The medicine helps your body to use insulin better. This medicine may be used for other purposes; ask your health care provider or pharmacist if you have questions. COMMON BRAND NAME(S): Glucotrol XL What should I tell my health care provider before I take this medicine? They need to know if you have any of these conditions:  diabetic ketoacidosis  glucose-6-phosphate dehydrogenase deficiency  heart disease  kidney disease  liver disease  porphyria  severe infection or injury  thyroid disease  an unusual or allergic reaction to glipizide, sulfa drugs, other medicines, foods, dyes, or preservatives  pregnant or trying to get pregnant  breast-feeding How should I use this medicine? Take this medicine by mouth. Follow the directions on the prescription label. Swallow the tablets with a drink of water and take with your breakfast. Take your medicine at the same time each day. Do not take more often than directed. Talk to your pediatrician regarding the use of this medicine in children. Special care may be needed. Elderly patients over 9 years old may have a stronger reaction and need a smaller dose. Overdosage: If you think you have taken too much of this medicine contact a poison control center or emergency room at once. NOTE: This medicine is only for you. Do not share this medicine with others. What if I miss a dose? If you miss a dose, take it as soon as you can. If it is almost time for your next dose, take only that dose. Do not take double or extra doses. What may interact with this medicine?  bosentan  chloramphenicol  cisapride  medicines for fungal  or yeast infections  metoclopramide  probenecid  warfarin Many medications may cause an increase or decrease in blood sugar, these include:  alcohol containing beverages  aspirin and aspirin-like drugs  chloramphenicol  chromium  clarithromycin  female hormones, like estrogens or progestins and birth control pills  heart medicines  isoniazid  female hormones or anabolic steroids  medicines for weight loss  medicines for allergies, asthma, cold, or cough  medicines for mental problems  medicines called MAO Inhibitors like Nardil, Parnate, Marplan, Eldepryl  niacin  NSAIDs, medicines for pain and inflammation, like ibuprofen or naproxen  pentamidine  phenytoin  probenecid  quinolone antibiotics like ciprofloxacin, levofloxacin, ofloxacin  some herbal dietary supplements  steroid medicines like prednisone or cortisone  thyroid medicine  water pills or diuretics This list may not describe all possible interactions. Give your health care provider a list of all the medicines, herbs, non-prescription drugs, or dietary supplements you use. Also tell them if you smoke, drink alcohol, or use illegal drugs. Some items may interact with your medicine. What should I watch for while using this medicine? Visit your doctor or health care professional for regular checks on your progress. A test called the HbA1C (A1C) will be monitored. This is a simple blood test. It measures your blood sugar control over the last 2 to 3 months. You will receive this test every 3 to 6 months. Learn how to check your blood sugar. Learn the symptoms of low and high blood sugar and how to manage them. Always carry a quick-source of sugar  with you in case you have symptoms of low blood sugar. Examples include hard sugar candy or glucose tablets. Make sure others know that you can choke if you eat or drink when you develop serious symptoms of low blood sugar, such as seizures or unconsciousness.  They must get medical help at once. Tell your doctor or health care professional if you have high blood sugar. You might need to change the dose of your medicine. If you are sick or exercising more than usual, you might need to change the dose of your medicine. Do not skip meals. Ask your doctor or health care professional if you should avoid alcohol. Many nonprescription cough and cold products contain sugar or alcohol. These can affect blood sugar. This medicine can make you more sensitive to the sun. Keep out of the sun. If you cannot avoid being in the sun, wear protective clothing and use sunscreen. Do not use sun lamps or tanning beds/booths. Wear a medical ID bracelet or chain, and carry a card that describes your disease and details of your medicine and dosage times. What side effects may I notice from receiving this medicine? Side effects that you should report to your doctor or health care professional as soon as possible:  allergic reactions like skin rash, itching or hives, swelling of the face, lips, or tongue  breathing problems  dark urine  fever, chills, sore throat  signs and symptoms of low blood sugar such as feeling anxious, confusion, dizziness, increased hunger, unusually weak or tired, sweating, shakiness, cold, irritable, headache, blurred vision, fast heartbeat, loss of consciousness  unusual bleeding or bruising  yellowing of the eyes or skin Side effects that usually do not require medical attention (report to your doctor or health care professional if they continue or are bothersome):  diarrhea  dizziness  headache  heartburn  nausea  stomach gas This list may not describe all possible side effects. Call your doctor for medical advice about side effects. You may report side effects to FDA at 1-800-FDA-1088. Where should I keep my medicine? Keep out of the reach of children. Store at room temperature between 15 to 30 degrees C (59 to 86 degrees F).  Protect from moisture and humidity. Throw away any unused medicine after the expiration date. NOTE: This sheet is a summary. It may not cover all possible information. If you have questions about this medicine, talk to your doctor, pharmacist, or health care provider.  2021 Elsevier/Gold Standard (2012-08-29 14:32:16)   Tinnitus Tinnitus refers to hearing a sound when there is no actual source for that sound. This is often described as ringing in the ears. However, people with this condition may hear a variety of noises, in one ear or in both ears. The sounds of tinnitus can be soft, loud, or somewhere in between. Tinnitus can last for a few seconds or can be constant for days. It may go away without treatment and come back at various times. When tinnitus is constant or happens often, it can lead to other problems, such as trouble sleeping and trouble concentrating. Almost everyone experiences tinnitus at some point. Tinnitus that is long-lasting (chronic) or comes back often (recurs) may require medical attention. What are the causes? The cause of tinnitus is often not known. In some cases, it can result from:  Exposure to loud noises from machinery, music, or other sources.  An object (foreign body) stuck in the ear.  Earwax buildup.  Drinking alcohol or caffeine.  Taking certain medicines.  Age-related hearing loss. It may also be caused by medical conditions such as:  Ear or sinus infections.  High blood pressure.  Heart diseases.  Anemia.  Allergies.  Meniere's disease.  Thyroid problems.  Tumors.  A weak, bulging blood vessel (aneurysm) near the ear. What are the signs or symptoms? The main symptom of tinnitus is hearing a sound when there is no source for that sound. It may sound like:  Buzzing.  Roaring.  Ringing.  Blowing air.  Hissing.  Whistling.  Sizzling.  Humming.  Running water.  A musical note.  Tapping. Symptoms may affect only one  ear (unilateral) or both ears (bilateral). How is this diagnosed? Tinnitus is diagnosed based on your symptoms, your medical history, and a physical exam. Your health care provider may do a thorough hearing test (audiologic exam) if your tinnitus:  Is unilateral.  Causes hearing difficulties.  Lasts 6 months or longer. You may work with a health care provider who specializes in hearing disorders (audiologist). You may be asked questions about your symptoms and how they affect your daily life. You may have other tests done, such as:  CT scan.  MRI.  An imaging test of how blood flows through your blood vessels (angiogram). How is this treated? Treating an underlying medical condition can sometimes make tinnitus go away. If your tinnitus continues, other treatments may include:  Medicines.  Therapy and counseling to help you manage the stress of living with tinnitus.  Sound generators to mask the tinnitus. These include: ? Tabletop sound machines that play relaxing sounds to help you fall asleep. ? Wearable devices that fit in your ear and play sounds or music. ? Acoustic neural stimulation. This involves using headphones to listen to music that contains an auditory signal. Over time, listening to this signal may change some pathways in your brain and make you less sensitive to tinnitus. This treatment is used for very severe cases when no other treatment is working.  Using hearing aids or cochlear implants if your tinnitus is related to hearing loss. Hearing aids are worn in the outer ear. Cochlear implants are surgically placed in the inner ear. Follow these instructions at home: Managing symptoms  When possible, avoid being in loud places and being exposed to loud sounds.  Wear hearing protection, such as earplugs, when you are exposed to loud noises.  Use a white noise machine, a humidifier, or other devices to mask the sound of tinnitus.  Practice techniques for reducing  stress, such as meditation, yoga, or deep breathing. Work with your health care provider if you need help with managing stress.  Sleep with your head slightly raised. This may reduce the impact of tinnitus.      General instructions  Do not use stimulants, such as nicotine, alcohol, or caffeine. Talk with your health care provider about other stimulants to avoid. Stimulants are substances that can make you feel alert and attentive by increasing certain activities in the body (such as heart rate and blood pressure). These substances may make tinnitus worse.  Take over-the-counter and prescription medicines only as told by your health care provider.  Try to get plenty of sleep each night.  Keep all follow-up visits as told by your health care provider. This is important. Contact a health care provider if:  Your tinnitus continues for 3 weeks or longer without stopping.  You develop sudden hearing loss.  Your symptoms get worse or do not get better with home care.  You feel  you are not able to manage the stress of living with tinnitus. Get help right away if:  You develop tinnitus after a head injury.  You have tinnitus along with any of the following: ? Dizziness. ? Loss of balance. ? Nausea and vomiting. ? Sudden, severe headache. These symptoms may represent a serious problem that is an emergency. Do not wait to see if the symptoms will go away. Get medical help right away. Call your local emergency services (911 in the [place] Do not drive yourself to the hospital. Summary  Tinnitus refers to hearing a sound when there is no actual source for that sound. This is often described as ringing in the ears.  Symptoms may affect only one ear (unilateral) or both ears (bilateral).  Use a white noise machine, a humidifier, or other devices to mask the sound of tinnitus.  Do not use stimulants, such as nicotine, alcohol, or caffeine. Talk with your health care provider about other  stimulants to avoid. These substances may make tinnitus worse. This information is not intended to replace advice given to you by your health care provider. Make sure you discuss any questions you have with your health care provider. Document Revised: 11/27/2018 Document Reviewed: 02/23/2017 Elsevier Patient Education  2021 Elsevier Inc.   Eustachian Tube Dysfunction Nasal saline Flonase Allergy medication allegra, claritin, zyrtec, xyzal at night   Eustachian tube dysfunction refers to a condition in which a blockage develops in the narrow passage that connects the middle ear to the back of the nose (eustachian tube). The eustachian tube regulates air pressure in the middle ear by letting air move between the ear and nose. It also helps to drain fluid from the middle ear space. Eustachian tube dysfunction can affect one or both ears. When the eustachian tube does not function properly, air pressure, fluid, or both can build up in the middle ear. What are the causes? This condition occurs when the eustachian tube becomes blocked or cannot open normally. Common causes of this condition include:  Ear infections.  Colds and other infections that affect the nose, mouth, and throat (upper respiratory tract).  Allergies.  Irritation from cigarette smoke.  Irritation from stomach acid coming up into the esophagus (gastroesophageal reflux). The esophagus is the tube that carries food from the mouth to the stomach.  Sudden changes in air pressure, such as from descending in an airplane or scuba diving.  Abnormal growths in the nose or throat, such as: ? Growths that line the nose (nasal polyps). ? Abnormal growth of cells (tumors). ? Enlarged tissue at the back of the throat (adenoids). What increases the risk? You are more likely to develop this condition if:  You smoke.  You are overweight.  You are a child who has: ? Certain birth defects of the mouth, such as cleft  palate. ? Large tonsils or adenoids. What are the signs or symptoms? Common symptoms of this condition include:  A feeling of fullness in the ear.  Ear pain.  Clicking or popping noises in the ear.  Ringing in the ear.  Hearing loss.  Loss of balance.  Dizziness. Symptoms may get worse when the air pressure around you changes, such as when you travel to an area of high elevation, fly on an airplane, or go scuba diving. How is this diagnosed? This condition may be diagnosed based on:  Your symptoms.  A physical exam of your ears, nose, and throat.  Tests, such as those that measure: ? The  movement of your eardrum (tympanogram). ? Your hearing (audiometry). How is this treated? Treatment depends on the cause and severity of your condition.  In mild cases, you may relieve your symptoms by moving air into your ears. This is called "popping the ears."  In more severe cases, or if you have symptoms of fluid in your ears, treatment may include: ? Medicines to relieve congestion (decongestants). ? Medicines that treat allergies (antihistamines). ? Nasal sprays or ear drops that contain medicines that reduce swelling (steroids). ? A procedure to drain the fluid in your eardrum (myringotomy). In this procedure, a small tube is placed in the eardrum to:  Drain the fluid.  Restore the air in the middle ear space. ? A procedure to insert a balloon device through the nose to inflate the opening of the eustachian tube (balloon dilation). Follow these instructions at home: Lifestyle  Do not do any of the following until your health care provider approves: ? Travel to high altitudes. ? Fly in airplanes. ? Work in a Estate agent or room. ? Scuba dive.  Do not use any products that contain nicotine or tobacco, such as cigarettes and e-cigarettes. If you need help quitting, ask your health care provider.  Keep your ears dry. Wear fitted earplugs during showering and bathing.  Dry your ears completely after. General instructions  Take over-the-counter and prescription medicines only as told by your health care provider.  Use techniques to help pop your ears as recommended by your health care provider. These may include: ? Chewing gum. ? Yawning. ? Frequent, forceful swallowing. ? Closing your mouth, holding your nose closed, and gently blowing as if you are trying to blow air out of your nose.  Keep all follow-up visits as told by your health care provider. This is important. Contact a health care provider if:  Your symptoms do not go away after treatment.  Your symptoms come back after treatment.  You are unable to pop your ears.  You have: ? A fever. ? Pain in your ear. ? Pain in your head or neck. ? Fluid draining from your ear.  Your hearing suddenly changes.  You become very dizzy.  You lose your balance. Summary  Eustachian tube dysfunction refers to a condition in which a blockage develops in the eustachian tube.  It can be caused by ear infections, allergies, inhaled irritants, or abnormal growths in the nose or throat.  Symptoms include ear pain, hearing loss, or ringing in the ears.  Mild cases are treated with maneuvers to unblock the ears, such as yawning or ear popping.  Severe cases are treated with medicines. Surgery may also be done (rare). This information is not intended to replace advice given to you by your health care provider. Make sure you discuss any questions you have with your health care provider. Document Revised: 09/05/2017 Document Reviewed: 09/05/2017 Elsevier Patient Education  2021 Elsevier Inc.  Cooking With Less [employer] with less salt is one way to reduce the amount of sodium you get from food. Sodium is one of the elements that make up salt. It is found naturally in foods and is also added to certain foods. Depending on your condition and overall health, your health care provider or dietitian may  recommend that you reduce your sodium intake. Most people should have less than 2,300 milligrams (mg) of sodium each day. If you have high blood pressure (hypertension), you may need to limit your sodium to 1,500 mg each day. Follow the  tips below to help reduce your sodium intake. What are tips for eating less sodium? Reading food labels  Check the food label before buying or using packaged ingredients. Always check the label for the serving size and sodium content.  Look for products with no more than 140 mg of sodium in one serving.  Check the % Daily Value column to see what percent of the daily recommended amount of sodium is provided in one serving of the product. Foods with 5% or less in this column are considered low in sodium. Foods with 20% or higher are considered high in sodium.  Do not choose foods with salt as one of the first three ingredients on the ingredients list. If salt is one of the first three ingredients, it usually means the item is high in sodium.   Shopping  Buy sodium-free or low-sodium products. Look for the following words on food labels: ? Low-sodium. ? Sodium-free. ? Reduced-sodium. ? No salt added. ? Unsalted.  Always check the sodium content even if foods are labeled as low-sodium or no salt added.  Buy fresh foods. Cooking  Use herbs, seasonings without salt, and spices as substitutes for salt.  Use sodium-free baking soda when baking.  Grill, braise, or roast foods to add flavor with less salt.  Avoid adding salt to pasta, rice, or hot cereals.  Drain and rinse canned vegetables, beans, and meat before use.  Avoid adding salt when cooking sweets and desserts.  Cook with low-sodium ingredients. What foods are high in sodium? Vegetables Regular canned vegetables (not low-sodium or reduced-sodium). Sauerkraut, pickled vegetables, and relishes. Olives. [place] fries. Onion rings. Regular canned tomato sauce and paste. Regular tomato and vegetable  juice. Frozen vegetables in sauces. Grains Instant hot cereals. Bread stuffing, pancake, and biscuit mixes. Croutons. Seasoned rice or pasta mixes. Noodle soup cups. Boxed or frozen macaroni and cheese. Regular salted crackers. Self-rising flour. Rolls. Bagels. Flour tortillas and wraps. Meats and other proteins Meat or fish that is salted, canned, smoked, cured, spiced, or pickled. This includes bacon, ham, sausages, hot dogs, corned beef, chipped beef, meat loaves, salt pork, jerky, pickled herring, anchovies, regular canned tuna, and sardines. Salted nuts. Dairy Processed cheese and cheese spreads. Cheese curds. Blue cheese. Feta cheese. String cheese. Regular cottage cheese. Buttermilk. Canned milk. The items listed above may not be a complete list of foods high in sodium. Actual amounts of sodium may be different depending on processing. Contact a dietitian for more information. What foods are low in sodium? Fruits Fresh, frozen, or canned fruit with no sauce added. Fruit juice. Vegetables Fresh or frozen vegetables with no sauce added. "No salt added" canned vegetables. "No salt added" tomato sauce and paste. Low-sodium or reduced-sodium tomato and vegetable juice. Grains Noodles, pasta, quinoa, rice. Shredded or puffed wheat or puffed rice. Regular or quick oats (not instant). Low-sodium crackers. Low-sodium bread. Whole-grain bread and whole-grain pasta. Unsalted popcorn. Meats and other proteins Fresh or frozen whole meats, poultry (not injected with sodium), and fish with no sauce added. Unsalted nuts. Dried peas, beans, and lentils without added salt. Unsalted canned beans. Eggs. Unsalted nut butters. Low-sodium canned tuna or chicken. Dairy Milk. Soy milk. Yogurt. Low-sodium cheeses, such as Swiss, [address], [place], and [employer]. Sherbet or ice cream (keep to  cup per serving). Cream cheese. Fats and oils Unsalted butter or margarine. Other foods Homemade pudding.  Sodium-free baking soda and baking powder. Herbs and spices. Low-sodium seasoning mixes. Beverages Coffee and tea. Carbonated beverages.  The items listed above may not be a complete list of foods low in sodium. Actual amounts of sodium may be different depending on processing. Contact a dietitian for more information. What are some salt alternatives when cooking? The following are herbs, seasonings, and spices that can be used instead of salt to flavor your food. Herbs should be fresh or dried. Do not choose packaged mixes. Next to the name of the herb, spice, or seasoning are some examples of foods you can pair it with. Herbs  Bay leaves - Soups, meat and vegetable dishes, and spaghetti sauce.  Basil - [place] In[place]alian dishes, soups, pasta, and fish dishes.  Cilantro - Meat, poultry, and vegetable dishes.  Chili powder - Marinades and Mexican dishes.  Chives - Salad dressings and potato dishes.  Cumin - Mexican dishes, couscous, and meat dishes.  Dill - Fish dishes, sauces, and salads.  Fennel - Meat and vegetable dishes, breads, and cookies.  Garlic (do not use garlic salt) - [place] dishes, meat dishes, salad dressings, and sauces.  Marjoram - Soups, potato dishes, and meat dishes.  Oregano - Pizza and spaghetti sauce.  Parsley - Salads, soups, pasta, and meat dishes.  Rosemary - [place] dishes, salad dressings, soups, and red meats.  Saffron - Fish dishes, pasta, and some poultry dishes.  Sage - Stuffings and sauces.  Tarragon - Fish and Whole Foodspoultry dishes.  Thyme - Stuffing, meat, and fish dishes. Seasonings  Lemon juice - Fish dishes, poultry dishes, vegetables, and salads.  Vinegar - Salad dressings, vegetables, and fish dishes. Spices  Cinnamon - Sweet dishes, such as cakes, cookies, and puddings.  Cloves - Gingerbread, puddings, and marinades for meats.  Curry - Vegetable dishes, fish and poultry dishes, and stir-fry dishes.  Ginger - Vegetable dishes, fish dishes,  and stir-fry dishes.  Nutmeg - Pasta, vegetables, poultry, fish dishes, and custard. Summary  Cooking with less salt is one way to reduce the amount of sodium that you get from food.  Buy sodium-free or low-sodium products.  Check the food label before using or buying packaged ingredients.  Use herbs, seasonings without salt, and spices as substitutes for salt in foods. This information is not intended to replace advice given to you by your health care provider. Make sure you discuss any questions you have with your health care provider. Document Revised: 05/08/2019 Document Reviewed: 05/08/2019 Elsevier Patient Education  2021 Elsevier Inc.  https://www.mata.com/https://www.nhlbi.nih.gov/files/docs/public/heart/dash_brief.pdf">  DASH Eating Plan DASH stands for Dietary Approaches to Stop Hypertension. The DASH eating plan is a healthy eating plan that has been shown to:  Reduce high blood pressure (hypertension).  Reduce your risk for type 2 diabetes, heart disease, and stroke.  Help with weight loss. What are tips for following this plan? Reading food labels  Check food labels for the amount of salt (sodium) per serving. Choose foods with less than 5 percent of the Daily Value of sodium. Generally, foods with less than 300 milligrams (mg) of sodium per serving fit into this eating plan.  To find whole grains, look for the word "whole" as the first word in the ingredient list. Shopping  Buy products labeled as "low-sodium" or "no salt added."  Buy fresh foods. Avoid canned foods and pre-made or frozen meals. Cooking  Avoid adding salt when cooking. Use salt-free seasonings or herbs instead of table salt or sea salt. Check with your health care provider or pharmacist before using salt substitutes.  Do not fry foods. Cook foods using healthy methods such as baking,  boiling, grilling, roasting, and broiling instead.  Cook with heart-healthy oils, such as olive, canola, avocado, soybean, or  sunflower oil. Meal planning  Eat a balanced diet that includes: ? 4 or more servings of fruits and 4 or more servings of vegetables each day. Try to fill one-half of your plate with fruits and vegetables. ? 6-8 servings of whole grains each day. ? Less than 6 oz (170 g) of lean meat, poultry, or fish each day. A 3-oz (85-g) serving of meat is about the same size as a deck of cards. One egg equals 1 oz (28 g). ? 2-3 servings of low-fat dairy each day. One serving is 1 cup (237 mL). ? 1 serving of nuts, seeds, or beans 5 times each week. ? 2-3 servings of heart-healthy fats. Healthy fats called omega-3 fatty acids are found in foods such as walnuts, flaxseeds, fortified milks, and eggs. These fats are also found in cold-water fish, such as sardines, salmon, and mackerel.  Limit how much you eat of: ? Canned or prepackaged foods. ? Food that is high in trans fat, such as some fried foods. ? Food that is high in saturated fat, such as fatty meat. ? Desserts and other sweets, sugary drinks, and other foods with added sugar. ? Full-fat dairy products.  Do not salt foods before eating.  Do not eat more than 4 egg yolks a week.  Try to eat at least 2 vegetarian meals a week.  Eat more home-cooked food and less restaurant, buffet, and fast food.   Lifestyle  When eating at a restaurant, ask that your food be prepared with less salt or no salt, if possible.  If you drink alcohol: ? Limit how much you use to:  0-1 drink a day for women who are not pregnant.  0-2 drinks a day for men. ? Be aware of how much alcohol is in your drink. In the [place] one drink equals one 12 oz bottle of beer (355 mL), one 5 oz glass of wine (148 mL), or one 1 oz glass of hard liquor (44 mL). General information  Avoid eating more than 2,300 mg of salt a day. If you have hypertension, you may need to reduce your sodium intake to 1,500 mg a day.  Work with your health care provider to maintain a healthy body  weight or to lose weight. Ask what an ideal weight is for you.  Get at least 30 minutes of exercise that causes your heart to beat faster (aerobic exercise) most days of the week. Activities may include walking, swimming, or biking.  Work with your health care provider or dietitian to adjust your eating plan to your individual calorie needs. What foods should I eat? Fruits All fresh, dried, or frozen fruit. Canned fruit in natural juice (without added sugar). Vegetables Fresh or frozen vegetables (raw, steamed, roasted, or grilled). Low-sodium or reduced-sodium tomato and vegetable juice. Low-sodium or reduced-sodium tomato sauce and tomato paste. Low-sodium or reduced-sodium canned vegetables. Grains Whole-grain or whole-wheat bread. Whole-grain or whole-wheat pasta. Brown rice. Orpah Cobb. Bulgur. Whole-grain and low-sodium cereals. Pita bread. Low-fat, low-sodium crackers. Whole-wheat flour tortillas. Meats and other proteins Skinless chicken or [place]. Ground chicken or [place]. Pork with fat trimmed off. Fish and seafood. Egg whites. Dried beans, peas, or lentils. Unsalted nuts, nut butters, and seeds. Unsalted canned beans. Lean cuts of beef with fat trimmed off. Low-sodium, lean precooked or cured meat, such as sausages or meat loaves. Dairy Low-fat (1%) or fat-free (  skim) milk. Reduced-fat, low-fat, or fat-free cheeses. Nonfat, low-sodium ricotta or cottage cheese. Low-fat or nonfat yogurt. Low-fat, low-sodium cheese. Fats and oils Soft margarine without trans fats. Vegetable oil. Reduced-fat, low-fat, or light mayonnaise and salad dressings (reduced-sodium). Canola, safflower, olive, avocado, soybean, and sunflower oils. Avocado. Seasonings and condiments Herbs. Spices. Seasoning mixes without salt. Other foods Unsalted popcorn and pretzels. Fat-free sweets. The items listed above may not be a complete list of foods and beverages you can eat. Contact a dietitian for more  information. What foods should I avoid? Fruits Canned fruit in a light or heavy syrup. Fried fruit. Fruit in cream or butter sauce. Vegetables Creamed or fried vegetables. Vegetables in a cheese sauce. Regular canned vegetables (not low-sodium or reduced-sodium). Regular canned tomato sauce and paste (not low-sodium or reduced-sodium). Regular tomato and vegetable juice (not low-sodium or reduced-sodium). Rosita FirePickles. Olives. Grains Baked goods made with fat, such as croissants, muffins, or some breads. Dry pasta or rice meal packs. Meats and other proteins Fatty cuts of meat. Ribs. Fried meat. Tomasa BlaseBacon. Bologna, salami, and other precooked or cured meats, such as sausages or meat loaves. Fat from the back of a pig (fatback). Bratwurst. Salted nuts and seeds. Canned beans with added salt. Canned or smoked fish. Whole eggs or egg yolks. Chicken or [place] with skin. Dairy Whole or 2% milk, cream, and half-and-half. Whole or full-fat cream cheese. Whole-fat or sweetened yogurt. Full-fat cheese. Nondairy creamers. Whipped toppings. Processed cheese and cheese spreads. Fats and oils Butter. Stick margarine. Lard. Shortening. Ghee. Bacon fat. Tropical oils, such as coconut, palm kernel, or palm oil. Seasonings and condiments Onion salt, garlic salt, seasoned salt, table salt, and sea salt. Worcestershire sauce. Tartar sauce. Barbecue sauce. Teriyaki sauce. Soy sauce, including reduced-sodium. Steak sauce. Canned and packaged gravies. Fish sauce. Oyster sauce. Cocktail sauce. Store-bought horseradish. Ketchup. Mustard. Meat flavorings and tenderizers. Bouillon cubes. Hot sauces. Pre-made or packaged marinades. Pre-made or packaged taco seasonings. Relishes. Regular salad dressings. Other foods Salted popcorn and pretzels. The items listed above may not be a complete list of foods and beverages you should avoid. Contact a dietitian for more information. Where to find more information  National Heart, Lung, and  Blood Institute: PopSteam.iswww.nhlbi.nih.gov  American Heart Association: www.heart.org  Academy of Nutrition and Dietetics: www.eatright.org  National Kidney Foundation: www.kidney.org Summary  The DASH eating plan is a healthy eating plan that has been shown to reduce high blood pressure (hypertension). It may also reduce your risk for type 2 diabetes, heart disease, and stroke.  When on the DASH eating plan, aim to eat more fresh fruits and vegetables, whole grains, lean proteins, low-fat dairy, and heart-healthy fats.  With the DASH eating plan, you should limit salt (sodium) intake to 2,300 mg a day. If you have hypertension, you may need to reduce your sodium intake to 1,500 mg a day.  Work with your health care provider or dietitian to adjust your eating plan to your individual calorie needs. This information is not intended to replace advice given to you by your health care provider. Make sure you discuss any questions you have with your health care provider. Document Revised: 04/19/2019 Document Reviewed: 04/19/2019 Elsevier Patient Education  2021 [employer]

## 2020-06-18 NOTE — Progress Notes (Signed)
Chief Complaint  Patient presents with  . Follow-up    Discuss ozempic medication and labs  . Immunizations    Pt had the J&J covid vaccine and asks which one should be mixed for her booster   F/u  1. HTN sl elevated on hctz 25 mg qd toprol xl 100 qam, 50 qpm prn 2. Dm 2 7.7 12/26/19 on lipitor 20 mg qhs metformin 500 (2 tabs) bid ozempic 0.5 sundays she thinks this is not controlling cbgs in the am 170s but daytime controlled and c/o reduced urination 3. Afib on xarelto and BB as above  Review of Systems  Constitutional: Positive for weight loss.  HENT: Positive for tinnitus.   Eyes: Negative for blurred vision.  Respiratory: Negative for shortness of breath.   Cardiovascular: Positive for palpitations. Negative for chest pain.  Gastrointestinal: Negative for abdominal pain.  Musculoskeletal: Negative for falls.  Skin: Negative for rash.  Neurological: Negative for headaches.  Psychiatric/Behavioral: Negative for memory loss.   Past Medical History:  Diagnosis Date  . Chicken pox   . Coronary artery disease    a. 2004 s/p DES to mid-LAD; b. 10/2005 Cath: LM nl, LAD patent stent, 86m D1 nl, LCX nl, OM1/2 nl, RCA nl, EF 60%; c. 05/2015 MV: EF 83%, no ischemia. Low risk.  . Diastolic dysfunction    a. 09/2014 Echo: Nl EF. Gr1 DD. Very mild AS. Mild MR. Nl RV fxn. PASP 350mg.  . DM (diabetes mellitus), type 2 ([place]  . Glaucoma   . Heart murmur   . Hyperlipidemia   . Hypertension   . PAF ([place]oxysmal atrial fibrillation) ([place]   a. 10/2014 Holter: PAF b. quit taking Eliquis in 01/2015 due to myalgias; c. CHA2DS2VASc = 6-->Xarelto.   Past Surgical History:  Procedure Laterality Date  . CARDIAC CATHETERIZATION     [place] x1 stent  . coronary artery stent  2004  . TONSILLECTOMY AND ADENOIDECTOMY  1949   Family History  Problem Relation Age of Onset  . Hyperlipidemia Mother   . Hypertension Mother   . Hyperlipidemia Father   . Hypertension Father   . Heart disease Father  [id number]. Heart attack Father    Social History   Socioeconomic History  . Marital status: Divorced    Spouse name: Not on file  . Number of children: Not on file  . Years of education: Not on file  . Highest education level: Not on file  Occupational History  . Not on file  Tobacco Use  . Smoking status: Former SmResearch scientist (life sciences). Smokeless tobacco: Never Used  . Tobacco comment: quit 25 years ago  Vaping Use  . Vaping Use: Never used  Substance and Sexual Activity  . Alcohol use: Yes    Alcohol/week: 0.0 standard drinks    Comment: very rarely  . Drug use: No  . Sexual activity: Not on file  Other Topics Concern  . Not on file  Social History Narrative   [place], granddaughter. in home.      Work - retired.   Diet - regular   Exercise - [employer]   Social Determinants of Health   Financial Resource Strain: Not on file  Food Insecurity: Not on file  Transportation Needs: Not on file  Physical Activity: Not on file  Stress: Not on file  Social Connections: Not on file  Intimate Partner Violence: Not on file   Current Meds  Medication Sig  . atorvastatin (LIPITOR) 20 MG tablet Take 1 tablet (20 mg total) by mouth at bedtime.  . dorzolamide (TRUSOPT) 2 % ophthalmic solution Place 1 drop into the left eye 2 times daily.  Marland Kitchen glucose blood (ONE TOUCH ULTRA TEST) test strip 1 each by Other route daily. Use as instructed  E11.9  . hydrochlorothiazide (HYDRODIURIL) 25 MG tablet Take 1 tablet (25 mg total) by mouth daily.  . Insulin Pen Needle (PEN NEEDLES) 30G X 8 MM MISC 1 Device by Does not apply route once a week.  . latanoprost (XALATAN) 0.005 % ophthalmic solution INSTILL 1 DROP INTO EACH EYE AT BEDTIME  . metFORMIN (GLUCOPHAGE-XR) 500 MG 24 hr tablet Take 2 tablets (1,000 mg total) by mouth in the morning and at bedtime. With food. ER formulation  . metoprolol succinate (TOPROL-XL) 100 MG 24 hr tablet 1 tablet (100 mg) daily  in the AM. You may take an extra 1/2 tablet (50 mg) in the PM as needed fast heart rate/palpitations  . metroNIDAZOLE (METROGEL) 1 % gel Apply topically daily as needed.  Glory Rosebush Delica Lancets 08M MISC Use up to 3 times daily to check blood sugar.  . rivaroxaban (XARELTO) 20 MG TABS tablet Take 1 tablet (20 mg total) by mouth daily with supper.  . Semaglutide,0.25 or 0.5MG/DOS, (OZEMPIC, 0.25 OR 0.5 MG/DOSE,) 2 MG/1.5ML SOPN Inject 0.375 mLs (0.5 mg total) into the skin once a week.  . timolol (TIMOPTIC) 0.5 % ophthalmic solution Place 1 drop into the left eye 2 (two) times daily.   Allergies  Allergen Reactions  . Niaspan [Niacin Er] Rash  . Brinzolamide-Brimonidine Other (See Comments)    Burning, Headache itching  . Januvia [Sitagliptin]     Joint pain, h/a, URI  . Lisinopril Cough  . Netarsudil-Latanoprost Other (See Comments)    Headache itching  . Timolol Other (See Comments)    Headache itching   No results found for this or any previous visit (from the past 2160 hour(s)). Objective  Body mass index is 37.57 kg/m. Wt Readings from Last 3 Encounters:  06/18/20 219 lb (99.3 kg)  01/29/20 222 lb (100.7 kg)  12/26/19 (!) 223 lb (101.2 kg)   Temp Readings from Last 3 Encounters:  06/18/20 (!) 96 F (35.6 C)  12/26/19 (!) 97.4 F (36.3 C) (Oral)  04/16/19 (!) 97.3 F (36.3 C) (Skin)   BP Readings from Last 3 Encounters:  06/18/20 132/88  01/29/20 (!) 150/94  12/26/19 126/70   Pulse Readings from Last 3 Encounters:  06/18/20 96  01/29/20 84  12/26/19 85    Physical Exam Vitals and nursing note reviewed.  Constitutional:      Appearance: Normal appearance. She is well-developed and well-groomed. She is obese.  HENT:     Head: Normocephalic and atraumatic.  Eyes:     Conjunctiva/sclera: Conjunctivae normal.     Pupils: Pupils are equal, round, and reactive to light.  Cardiovascular:     Rate and Rhythm: Normal rate. Rhythm irregular.     Heart sounds:  Normal heart sounds. No murmur heard.     Comments: In Afib today Pulmonary:     Effort: Pulmonary effort is normal.     Breath sounds: Normal breath sounds.  Abdominal:     Tenderness: There is no abdominal tenderness.  Skin:    General: Skin is warm and dry.  Neurological:     General: No focal deficit present.     Mental Status: She is alert  and oriented to person, place, and time. Mental status is at baseline.     Gait: Gait normal.  Psychiatric:        Attention and Perception: Attention and perception normal.        Mood and Affect: Mood and affect normal.        Speech: Speech normal.        Behavior: Behavior normal. Behavior is cooperative.        Thought Content: Thought content normal.        Cognition and Memory: Cognition and memory normal.        Judgment: Judgment normal.     Assessment  Plan  Hypertension associated with diabetes ([place]) - Plan: Comprehensive metabolic panel, Lipid panel, CBC with Differential/Platelet, Hemoglobin A1c  hctz 25 mg qd toprol xl 100 qam, 50 qpm prn can take if palpitations elevated HTN Hold ozempic x 2 sundays  Consider glipizide cont metformin xr 2 bid   Vitamin D deficiency - Plan: Vitamin D (25 hydroxy)  Persistent atrial fibrillation ([facility]) controlled F/u Dr. Rockey Situ cont BB and xarelto   HM Had flu shotutd prevnar hadand pna 23 Zoster had, declines shingrix Tdap utd covid vx J&J x 1 consider pfizer disc 06/18/20 Neg hep C  DEXA 04/07/16 neg colonoscopy had10/15/13 out of age age window pap mammo declines 7/15/20and 11/17/2020and 06/18/20  Skin no issues rec calcium 600 mg bid, vit D 3rec 2000-25000IU qd  Declines MMR Eye 12/10/18 saw Dr. Tammi Sou referral to [facility] instead of [facility]    Provider: Dr. Olivia Mackie McLean-Scocuzza-Internal Medicine

## 2020-06-19 ENCOUNTER — Telehealth: Payer: Self-pay

## 2020-06-19 NOTE — Telephone Encounter (Signed)
Left message to call back for lab results.

## 2020-06-19 NOTE — Telephone Encounter (Signed)
Pt returned call. She said she saw results in myChart. Call her back if you need anything else.

## 2020-06-22 ENCOUNTER — Other Ambulatory Visit: Payer: Self-pay | Admitting: Internal Medicine

## 2020-06-22 DIAGNOSIS — E1159 Type 2 diabetes mellitus with other circulatory complications: Secondary | ICD-10-CM

## 2020-06-22 DIAGNOSIS — I152 Hypertension secondary to endocrine disorders: Secondary | ICD-10-CM

## 2020-06-22 MED ORDER — GLIPIZIDE ER 2.5 MG PO TB24: 2.5000 mg | ORAL_TABLET | Freq: Every day | ORAL | 3 refills | Status: DC

## 2020-07-24 DIAGNOSIS — H401423 Capsular glaucoma with pseudoexfoliation of lens, left eye, severe stage: Secondary | ICD-10-CM | POA: Diagnosis not present

## 2020-08-03 ENCOUNTER — Telehealth: Payer: Self-pay | Admitting: Internal Medicine

## 2020-08-03 NOTE — Telephone Encounter (Signed)
Pt would like as call back to discuss the glipiZIDE (GLUCOTROL XL) 2.5 MG 24 hr tablet She is wondering if it needs to be increased because she is nit seeing any help

## 2020-08-04 ENCOUNTER — Telehealth: Payer: Self-pay | Admitting: Internal Medicine

## 2020-08-04 ENCOUNTER — Other Ambulatory Visit: Payer: Self-pay | Admitting: Internal Medicine

## 2020-08-04 DIAGNOSIS — I152 Hypertension secondary to endocrine disorders: Secondary | ICD-10-CM

## 2020-08-04 DIAGNOSIS — E1159 Type 2 diabetes mellitus with other circulatory complications: Secondary | ICD-10-CM

## 2020-08-04 MED ORDER — GLIPIZIDE ER 5 MG PO TB24: 5.0000 mg | ORAL_TABLET | Freq: Every day | ORAL | 3 refills | Status: AC

## 2020-08-04 NOTE — Telephone Encounter (Signed)
I spoke with patient & she was concerned that her blood sugars are 170's fasting. Even up to 200 once. She said that she may have eaten something high in carb the previous night. She wanted to know should she increase dose of Glipizide? Currently just taking the 2.5mg  dose due to her being sensitive to medications, but she has tolerated fine. She has also signed up for some exercise classes & hopes this help as well.

## 2020-08-04 NOTE — Telephone Encounter (Signed)
Patient was returning call about medication 

## 2020-08-04 NOTE — Telephone Encounter (Signed)
LMTCB

## 2020-08-04 NOTE — Telephone Encounter (Signed)
Increase to 5 mg glipizide sent new pill  Or can take 2-2.5 mg dose for now at the same time with food always

## 2020-08-05 NOTE — Telephone Encounter (Signed)
Patient was returning call about medication 

## 2020-08-05 NOTE — Telephone Encounter (Signed)
Left message to return call 

## 2020-08-06 NOTE — Telephone Encounter (Signed)
See 08/03/20 telephone encounter

## 2020-08-06 NOTE — Telephone Encounter (Signed)
Grier Mitts routed conversation to You 14 hours ago (4:55 PM)   Grier Mitts 14 hours ago (4:55 PM)   IJ    Patient was returning call about medication

## 2020-08-13 NOTE — Telephone Encounter (Signed)
Left message to return call. ° °Mychart message sent  °

## 2020-08-14 DIAGNOSIS — M5136 Other intervertebral disc degeneration, lumbar region: Secondary | ICD-10-CM | POA: Diagnosis not present

## 2020-08-14 DIAGNOSIS — M9901 Segmental and somatic dysfunction of cervical region: Secondary | ICD-10-CM | POA: Diagnosis not present

## 2020-08-14 DIAGNOSIS — M5412 Radiculopathy, cervical region: Secondary | ICD-10-CM | POA: Diagnosis not present

## 2020-08-14 DIAGNOSIS — M9903 Segmental and somatic dysfunction of lumbar region: Secondary | ICD-10-CM | POA: Diagnosis not present

## 2020-08-17 ENCOUNTER — Telehealth: Payer: Self-pay | Admitting: Internal Medicine

## 2020-08-17 NOTE — Telephone Encounter (Signed)
Left message for patient to call back and schedule Medicare Annual Wellness Visit (AWV)   This should be a virtual visit only=30 minutes.  No hx of AWV; please schedule at anytime with Denisa O'Brien-Blaney at [facility] [place]   

## 2020-10-19 ENCOUNTER — Telehealth: Payer: Self-pay | Admitting: Internal Medicine

## 2020-10-19 ENCOUNTER — Other Ambulatory Visit: Payer: Self-pay | Admitting: Internal Medicine

## 2020-10-19 ENCOUNTER — Encounter: Payer: Self-pay | Admitting: Internal Medicine

## 2020-10-19 MED ORDER — ATORVASTATIN CALCIUM 20 MG PO TABS: 20.0000 mg | ORAL_TABLET | Freq: Every day | ORAL | 3 refills | Status: AC

## 2020-10-19 NOTE — Telephone Encounter (Signed)
Sorry to hear this  All [facility] [place] [facility]  She can have fasting labs done 12/25/20 [facility] fasting in [place]-ive ordered and mail lab form please   She will need to find a doctor back home and so will her daughter w/in the next 3-6 months  I will miss them

## 2020-10-19 NOTE — Telephone Encounter (Signed)
I called patient to schedule her appointment for AWV.  Patient said she's moving to [place] on 10/28/20.  She cancelled her follow up appointment with Dr.Tracy on 12/16/20.  Patient wants to know if she should have lab work done before she leaves, to check how the Glipizide is working?  Patient said she's been under a lot of stress getting ready to move.

## 2020-10-19 NOTE — Telephone Encounter (Signed)
FYI-I spoke to patient to schedule AWV.  Patient said she's moving to [place] on 10/28/20. I sent a phone note to provider to let her know and patient wanted to know if she needed lab work, before she moves.

## 2020-10-19 NOTE — Addendum Note (Signed)
Addended by: Quentin Ore on: 10/19/2020 05:25 PM   Modules accepted: Orders

## 2020-10-20 MED ORDER — HYDROCHLOROTHIAZIDE 25 MG PO TABS: 25.0000 mg | ORAL_TABLET | Freq: Every day | ORAL | 1 refills | Status: DC

## 2020-10-20 MED ORDER — HYDROCHLOROTHIAZIDE 25 MG PO TABS: 25.0000 mg | ORAL_TABLET | Freq: Every day | ORAL | 1 refills | Status: AC

## 2020-10-20 NOTE — Telephone Encounter (Signed)
Pt is aware of message below and lab orders have been mailed.

## 2020-10-20 NOTE — Addendum Note (Signed)
Addended by: Sandy Salaam on: 10/20/2020 01:34 PM   Modules accepted: Orders

## 2020-10-21 DIAGNOSIS — M5412 Radiculopathy, cervical region: Secondary | ICD-10-CM | POA: Diagnosis not present

## 2020-10-21 DIAGNOSIS — M9901 Segmental and somatic dysfunction of cervical region: Secondary | ICD-10-CM | POA: Diagnosis not present

## 2020-10-21 DIAGNOSIS — M9903 Segmental and somatic dysfunction of lumbar region: Secondary | ICD-10-CM | POA: Diagnosis not present

## 2020-10-21 DIAGNOSIS — M5136 Other intervertebral disc degeneration, lumbar region: Secondary | ICD-10-CM | POA: Diagnosis not present

## 2020-11-25 ENCOUNTER — Telehealth: Payer: Self-pay | Admitting: Cardiovascular Disease

## 2020-11-25 MED ORDER — RIVAROXABAN 20 MG PO TABS: 20.0000 mg | ORAL_TABLET | Freq: Every day | ORAL | 1 refills | Status: AC

## 2020-11-25 NOTE — Telephone Encounter (Signed)
*  STAT* If patient is at the pharmacy, call can be transferred to refill team.   1. Which medications need to be refilled? (please list name of each medication and dose if known) Xarelto 20 MG 1 tablet daily  2. Which pharmacy/location (including street and city if local pharmacy) is medication to be sent to? [facility] in [place], [place] [address], ph [phone]  3. Do they need a 30 day or 90 day supply? 90 day

## 2020-11-25 NOTE — Telephone Encounter (Signed)
Prescription refill request for Xarelto received.  Indication: Atrial fib Last office visit: 01/29/20  T. Gollan MD Weight: 100.7kg Age: 80 Scr: 0.81 on 06/18/20 CrCl: 92.46  Based on above findings Xarelto 20mg  daily is the appropriate dose.  Refill approved.

## 2020-11-25 NOTE — Telephone Encounter (Signed)
Refill request

## 2020-12-16 ENCOUNTER — Ambulatory Visit: Payer: PPO | Admitting: Internal Medicine

## 2021-02-02 ENCOUNTER — Ambulatory Visit: Payer: PPO | Admitting: Cardiovascular Disease

## 2021-12-10 ENCOUNTER — Telehealth: Payer: Self-pay | Admitting: Cardiovascular Disease

## 2021-12-10 NOTE — Telephone Encounter (Signed)
[place]  Deleting recall per patient request
